# Patient Record
Sex: Male | Born: 1960 | Race: White | Hispanic: No | Marital: Married | Smoking: Current every day smoker
Health system: Southern US, Community
[De-identification: ages and names within clinical notes are randomized; demographics above are authoritative.]

## PROBLEM LIST (undated history)

## (undated) DIAGNOSIS — I509 Heart failure, unspecified: Secondary | ICD-10-CM

## (undated) DIAGNOSIS — I10 Essential (primary) hypertension: Secondary | ICD-10-CM

## (undated) DIAGNOSIS — Z87898 Personal history of other specified conditions: Secondary | ICD-10-CM

## (undated) DIAGNOSIS — F1991 Other psychoactive substance use, unspecified, in remission: Secondary | ICD-10-CM

## (undated) DIAGNOSIS — H8093 Unspecified otosclerosis, bilateral: Secondary | ICD-10-CM

## (undated) DIAGNOSIS — R740 Nonspecific elevation of levels of transaminase and lactic acid dehydrogenase [LDH]: Secondary | ICD-10-CM

## (undated) DIAGNOSIS — IMO0002 Reserved for concepts with insufficient information to code with codable children: Secondary | ICD-10-CM

## (undated) DIAGNOSIS — F1910 Other psychoactive substance abuse, uncomplicated: Secondary | ICD-10-CM

## (undated) DIAGNOSIS — J302 Other seasonal allergic rhinitis: Secondary | ICD-10-CM

## (undated) DIAGNOSIS — M069 Rheumatoid arthritis, unspecified: Secondary | ICD-10-CM

## (undated) DIAGNOSIS — B182 Chronic viral hepatitis C: Secondary | ICD-10-CM

## (undated) DIAGNOSIS — F1090 Alcohol use, unspecified, uncomplicated: Secondary | ICD-10-CM

## (undated) DIAGNOSIS — U071 COVID-19: Secondary | ICD-10-CM

## (undated) DIAGNOSIS — M79602 Pain in left arm: Secondary | ICD-10-CM

## (undated) DIAGNOSIS — K732 Chronic active hepatitis, not elsewhere classified: Secondary | ICD-10-CM

## (undated) DIAGNOSIS — M5030 Other cervical disc degeneration, unspecified cervical region: Secondary | ICD-10-CM

## (undated) DIAGNOSIS — H9190 Unspecified hearing loss, unspecified ear: Secondary | ICD-10-CM

## (undated) HISTORY — PX: DEBRIDEMENT LEG: SUR390

## (undated) HISTORY — PX: TIBIA FRACTURE SURGERY: SHX806

## (undated) HISTORY — DX: Rheumatoid arthritis, unspecified: M06.9

## (undated) HISTORY — DX: Heart failure, unspecified: I50.9

## (undated) HISTORY — DX: Essential (primary) hypertension: I10

## (undated) HISTORY — DX: Unspecified otosclerosis, bilateral: H80.93

## (undated) HISTORY — DX: Reserved for concepts with insufficient information to code with codable children: IMO0002

## (undated) HISTORY — DX: Other psychoactive substance abuse, uncomplicated: F19.10

## (undated) HISTORY — DX: Alcohol use, unspecified, uncomplicated: F10.90

## (undated) HISTORY — DX: Personal history of other specified conditions: Z87.898

## (undated) HISTORY — DX: Other cervical disc degeneration, unspecified cervical region: M50.30

## (undated) HISTORY — DX: Nonspecific elevation of levels of transaminase and lactic acid dehydrogenase (ldh): R74.0

## (undated) HISTORY — DX: Other psychoactive substance use, unspecified, in remission: F19.91

## (undated) HISTORY — DX: Chronic active hepatitis, not elsewhere classified: K73.2

## (undated) HISTORY — DX: Chronic viral hepatitis C: B18.2

---

## 1983-09-02 HISTORY — PX: SHOULDER SURGERY: SHX246

## 2003-09-02 HISTORY — PX: HIP SURGERY: SHX245

## 2012-07-27 DIAGNOSIS — M069 Rheumatoid arthritis, unspecified: Secondary | ICD-10-CM | POA: Insufficient documentation

## 2012-07-27 DIAGNOSIS — F1911 Other psychoactive substance abuse, in remission: Secondary | ICD-10-CM | POA: Insufficient documentation

## 2012-07-27 DIAGNOSIS — M5030 Other cervical disc degeneration, unspecified cervical region: Secondary | ICD-10-CM | POA: Insufficient documentation

## 2017-04-17 ENCOUNTER — Encounter: Payer: Self-pay | Admitting: Physician Assistant

## 2017-04-17 ENCOUNTER — Ambulatory Visit (INDEPENDENT_AMBULATORY_CARE_PROVIDER_SITE_OTHER): Payer: 59 | Admitting: Physician Assistant

## 2017-04-17 VITALS — BP 174/113 | HR 76 | Ht 70.0 in | Wt 147.0 lb

## 2017-04-17 DIAGNOSIS — Z1159 Encounter for screening for other viral diseases: Secondary | ICD-10-CM

## 2017-04-17 DIAGNOSIS — Z789 Other specified health status: Secondary | ICD-10-CM

## 2017-04-17 DIAGNOSIS — Z136 Encounter for screening for cardiovascular disorders: Secondary | ICD-10-CM | POA: Diagnosis not present

## 2017-04-17 DIAGNOSIS — M5030 Other cervical disc degeneration, unspecified cervical region: Secondary | ICD-10-CM | POA: Diagnosis not present

## 2017-04-17 DIAGNOSIS — K732 Chronic active hepatitis, not elsewhere classified: Secondary | ICD-10-CM

## 2017-04-17 DIAGNOSIS — Z131 Encounter for screening for diabetes mellitus: Secondary | ICD-10-CM | POA: Diagnosis not present

## 2017-04-17 DIAGNOSIS — Z13220 Encounter for screening for lipoid disorders: Secondary | ICD-10-CM | POA: Diagnosis not present

## 2017-04-17 DIAGNOSIS — Z9189 Other specified personal risk factors, not elsewhere classified: Secondary | ICD-10-CM | POA: Diagnosis not present

## 2017-04-17 DIAGNOSIS — Z7689 Persons encountering health services in other specified circumstances: Secondary | ICD-10-CM | POA: Diagnosis not present

## 2017-04-17 DIAGNOSIS — M069 Rheumatoid arthritis, unspecified: Secondary | ICD-10-CM

## 2017-04-17 DIAGNOSIS — F17200 Nicotine dependence, unspecified, uncomplicated: Secondary | ICD-10-CM | POA: Diagnosis not present

## 2017-04-17 DIAGNOSIS — I10 Essential (primary) hypertension: Secondary | ICD-10-CM | POA: Diagnosis not present

## 2017-04-17 DIAGNOSIS — B182 Chronic viral hepatitis C: Secondary | ICD-10-CM

## 2017-04-17 MED ORDER — ASPIRIN 81 MG PO TABS: 81.0000 mg | ORAL_TABLET | Freq: Every day | ORAL | 11 refills | Status: DC

## 2017-04-17 MED ORDER — CANDESARTAN CILEXETIL-HCTZ 16-12.5 MG PO TABS: 1.0000 | ORAL_TABLET | Freq: Every day | ORAL | 0 refills | Status: DC

## 2017-04-17 NOTE — Patient Instructions (Addendum)
For blood pressure: - start candesartan-hydrochlorothiazide every morning - baby aspirin 81 mg to help prevent heart attack/stroke - limit alcohol to 2 drinks per day - try to cut back on cigarettes - limit salt to <1500 mg/day - DASH eating plan - follow-up in 2 weeks  Return in 2 weeks for complete physical exam with fasting labs. You can have your labs done before your appointment. The lab is a walk-in open M-F 7:30a-4:30p (closed 12:30-1:30p). Nothing to eat or drink after midnight or at least 8 hours before your blood draw. You can have water and your medications.    DASH Eating Plan DASH stands for "Dietary Approaches to Stop Hypertension." The DASH eating plan is a healthy eating plan that has been shown to reduce high blood pressure (hypertension). It may also reduce your risk for type 2 diabetes, heart disease, and stroke. The DASH eating plan may also help with weight loss. What are tips for following this plan? General guidelines  Avoid eating more than 2,300 mg (milligrams) of salt (sodium) a day. If you have hypertension, you may need to reduce your sodium intake to 1,500 mg a day.  Limit alcohol intake to no more than 1 drink a day for nonpregnant women and 2 drinks a day for men. One drink equals 12 oz of beer, 5 oz of wine, or 1 oz of hard liquor.  Work with your health care provider to maintain a healthy body weight or to lose weight. Ask what an ideal weight is for you.  Get at least 30 minutes of exercise that causes your heart to beat faster (aerobic exercise) most days of the week. Activities may include walking, swimming, or biking.  Work with your health care provider or diet and nutrition specialist (dietitian) to adjust your eating plan to your individual calorie needs. Reading food labels  Check food labels for the amount of sodium per serving. Choose foods with less than 5 percent of the Daily Value of sodium. Generally, foods with less than 300 mg of sodium  per serving fit into this eating plan.  To find whole grains, look for the word "whole" as the first word in the ingredient list. Shopping  Buy products labeled as "low-sodium" or "no salt added."  Buy fresh foods. Avoid canned foods and premade or frozen meals. Cooking  Avoid adding salt when cooking. Use salt-free seasonings or herbs instead of table salt or sea salt. Check with your health care provider or pharmacist before using salt substitutes.  Do not fry foods. Cook foods using healthy methods such as baking, boiling, grilling, and broiling instead.  Cook with heart-healthy oils, such as olive, canola, soybean, or sunflower oil. Meal planning   Eat a balanced diet that includes: ? 5 or more servings of fruits and vegetables each day. At each meal, try to fill half of your plate with fruits and vegetables. ? Up to 6-8 servings of whole grains each day. ? Less than 6 oz of lean meat, poultry, or fish each day. A 3-oz serving of meat is about the same size as a deck of cards. One egg equals 1 oz. ? 2 servings of low-fat dairy each day. ? A serving of nuts, seeds, or beans 5 times each week. ? Heart-healthy fats. Healthy fats called Omega-3 fatty acids are found in foods such as flaxseeds and coldwater fish, like sardines, salmon, and mackerel.  Limit how much you eat of the following: ? Canned or prepackaged foods. ? Food that  is high in trans fat, such as fried foods. ? Food that is high in saturated fat, such as fatty meat. ? Sweets, desserts, sugary drinks, and other foods with added sugar. ? Full-fat dairy products.  Do not salt foods before eating.  Try to eat at least 2 vegetarian meals each week.  Eat more home-cooked food and less restaurant, buffet, and fast food.  When eating at a restaurant, ask that your food be prepared with less salt or no salt, if possible. What foods are recommended? The items listed may not be a complete list. Talk with your dietitian  about what dietary choices are best for you. Grains Whole-grain or whole-wheat bread. Whole-grain or whole-wheat pasta. Brown rice. Orpah Cobb. Bulgur. Whole-grain and low-sodium cereals. Pita bread. Low-fat, low-sodium crackers. Whole-wheat flour tortillas. Vegetables Fresh or frozen vegetables (raw, steamed, roasted, or grilled). Low-sodium or reduced-sodium tomato and vegetable juice. Low-sodium or reduced-sodium tomato sauce and tomato paste. Low-sodium or reduced-sodium canned vegetables. Fruits All fresh, dried, or frozen fruit. Canned fruit in natural juice (without added sugar). Meat and other protein foods Skinless chicken or [place]. Ground chicken or [place]. Pork with fat trimmed off. Fish and seafood. Egg whites. Dried beans, peas, or lentils. Unsalted nuts, nut butters, and seeds. Unsalted canned beans. Lean cuts of beef with fat trimmed off. Low-sodium, lean deli meat. Dairy Low-fat (1%) or fat-free (skim) milk. Fat-free, low-fat, or reduced-fat cheeses. Nonfat, low-sodium ricotta or cottage cheese. Low-fat or nonfat yogurt. Low-fat, low-sodium cheese. Fats and oils Soft margarine without trans fats. Vegetable oil. Low-fat, reduced-fat, or light mayonnaise and salad dressings (reduced-sodium). Canola, safflower, olive, soybean, and sunflower oils. Avocado. Seasoning and other foods Herbs. Spices. Seasoning mixes without salt. Unsalted popcorn and pretzels. Fat-free sweets. What foods are not recommended? The items listed may not be a complete list. Talk with your dietitian about what dietary choices are best for you. Grains Baked goods made with fat, such as croissants, muffins, or some breads. Dry pasta or rice meal packs. Vegetables Creamed or fried vegetables. Vegetables in a cheese sauce. Regular canned vegetables (not low-sodium or reduced-sodium). Regular canned tomato sauce and paste (not low-sodium or reduced-sodium). Regular tomato and vegetable juice (not low-sodium or  reduced-sodium). Rosita Fire. Olives. Fruits Canned fruit in a light or heavy syrup. Fried fruit. Fruit in cream or butter sauce. Meat and other protein foods Fatty cuts of meat. Ribs. Fried meat. Tomasa Blase. Sausage. Bologna and other processed lunch meats. Salami. Fatback. Hotdogs. Bratwurst. Salted nuts and seeds. Canned beans with added salt. Canned or smoked fish. Whole eggs or egg yolks. Chicken or [place] with skin. Dairy Whole or 2% milk, cream, and half-and-half. Whole or full-fat cream cheese. Whole-fat or sweetened yogurt. Full-fat cheese. Nondairy creamers. Whipped toppings. Processed cheese and cheese spreads. Fats and oils Butter. Stick margarine. Lard. Shortening. Ghee. Bacon fat. Tropical oils, such as coconut, palm kernel, or palm oil. Seasoning and other foods Salted popcorn and pretzels. Onion salt, garlic salt, seasoned salt, table salt, and sea salt. Worcestershire sauce. Tartar sauce. Barbecue sauce. Teriyaki sauce. Soy sauce, including reduced-sodium. Steak sauce. Canned and packaged gravies. Fish sauce. Oyster sauce. Cocktail sauce. Horseradish that you find on the shelf. Ketchup. Mustard. Meat flavorings and tenderizers. Bouillon cubes. Hot sauce and Tabasco sauce. Premade or packaged marinades. Premade or packaged taco seasonings. Relishes. Regular salad dressings. Where to find more information:  National Heart, Lung, and Blood Institute: PopSteam.is  American Heart Association: www.heart.org Summary  The DASH eating plan is a healthy  eating plan that has been shown to reduce high blood pressure (hypertension). It may also reduce your risk for type 2 diabetes, heart disease, and stroke.  With the DASH eating plan, you should limit salt (sodium) intake to 2,300 mg a day. If you have hypertension, you may need to reduce your sodium intake to 1,500 mg a day.  When on the DASH eating plan, aim to eat more fresh fruits and vegetables, whole grains, lean proteins, low-fat  dairy, and heart-healthy fats.  Work with your health care provider or diet and nutrition specialist (dietitian) to adjust your eating plan to your individual calorie needs. This information is not intended to replace advice given to you by your health care provider. Make sure you discuss any questions you have with your health care provider. Document Released: 08/07/2011 Document Revised: 08/11/2016 Document Reviewed: 08/11/2016 Elsevier Interactive Patient Education  2017 [employer].

## 2017-04-17 NOTE — Progress Notes (Signed)
HPI:                                                                Melvin Rich is a 56 y.o. male who presents to [facility]: Primary Care Sports Medicine today to establish care   HTN: patient reports diagnosed 30 years ago. Has been prescribed lisinopril-hctz, but self-discontinued due to lethargy. Has not been on antihypertensive therapy for over 5 years. Denies vision change, headache, chest pain with exertion, orthopnea, lightheadedness, syncope and edema. Risk factors include: male sex, tobacco use  RA: reports bilateral elbow "clicking" and elbows feel "on fire" daily. Denies constitutional symptoms or rash.   Health Maintenance Health Maintenance  Topic Date Due  . Hepatitis C Screening  08/08/1961  . HIV Screening  02/02/1976  . TETANUS/TDAP  02/02/1980  . COLONOSCOPY  02/02/2011  . INFLUENZA VACCINE  04/01/2017    Past Medical History:  Diagnosis Date  . Alcohol use disorder (HCC)   . Degenerative disc disease, cervical   . History of drug use disorder    Heroin, IVDA  . Hypertension   . Rheumatoid arthritis (HCC)   . Substance abuse    Past Surgical History:  Procedure Laterality Date  . HIP SURGERY  2005  . SHOULDER SURGERY  1985   Social History  Substance Use Topics  . Smoking status: Current Every Day Smoker    Packs/day: 0.50    Years: 45.00    Types: Cigarettes  . Smokeless tobacco: Never Used  . Alcohol use 1.8 - 2.4 oz/week    3 - 4 Cans of beer per week   He was adopted. Family history is unknown by patient.  ROS: Review of Systems  Constitutional: Positive for diaphoresis. Negative for chills, fever and weight loss.  HENT: Negative.   Eyes: Negative.   Respiratory: Negative.   Cardiovascular: Negative.   Musculoskeletal: Positive for joint pain and neck pain.  Skin: Negative for rash.  Neurological:       + numbness b/l hands  Endo/Heme/Allergies: Negative.   Psychiatric/Behavioral: Negative.       Medications: Current Outpatient Prescriptions  Medication Sig Dispense Refill  . aspirin 81 MG tablet Take 1 tablet (81 mg total) by mouth daily. 30 tablet 11  . candesartan-hydrochlorothiazide (ATACAND HCT) 16-12.5 MG tablet Take 1 tablet by mouth daily. 30 tablet 0   No current facility-administered medications for this visit.    No Known Allergies     Objective:  BP (!) 174/113   Pulse 76   Ht 5\' 10"  (1.778 m)   Wt 147 lb (66.7 kg)   BMI 21.09 kg/m  Gen:  alert, not ill-appearing, no distress, appropriate for age HEENT: head normocephalic without obvious abnormality, conjunctiva and cornea clear, trachea midline CV: Normal rate, regular rhythm, s1 and s2 distinct, no murmurs, clicks or rubs, no carotid bruit Pulm: Normal work of breathing, normal phonation, clear to auscultation Neuro: alert and oriented x 3, no tremor MSK: extremities atraumatic, normal gait and station Skin: intact, no rashes on exposed skin, no jaundice, no cyanosis Psych: well-groomed, cooperative, good eye contact, euthymic mood, affect mood-congruent, speech is articulate, and thought processes clear and goal-directed    No results found for this or any previous visit (from  the past 72 hour(s)). No results found.  Depression screen PHQ 2/9 04/17/2017  Decreased Interest 0  Down, Depressed, Hopeless 0  PHQ - 2 Score 0      Assessment and Plan: 56 y.o. male with   1. Encounter to establish care - reviewed PMH - reviewed health maintenance - due for colon cancer screening - due for prostate cancer screening - negative PHQ2  2. Uncontrolled stage 2 hypertension BP Readings from Last 3 Encounters:  04/17/17 (!) 174/113  - starting combination antihypertensive. Will avoid Lisinopril due to patient-reported lethargy - baby aspirin 81 mg for primary prevention - limit alcohol to 2 drinks per day - try to cut back on cigarettes - limit salt to <1500 mg/day - DASH eating plan -  follow-up in 2 weeks - candesartan-hydrochlorothiazide (ATACAND HCT) 16-12.5 MG tablet; Take 1 tablet by mouth daily.  Dispense: 30 tablet; Refill: 0 - aspirin 81 MG tablet; Take 1 tablet (81 mg total) by mouth daily.  Dispense: 30 tablet; Refill: 11 - COMPLETE METABOLIC PANEL WITH GFR - CBC  3. Tobacco use disorder - not ready to quit, declines smoking cessation  4. Drinks three beers per day - educated to limit to 2 standard drinks per day  5. DDD (degenerative disc disease), cervical - currently asymptomatic  6. Rheumatoid arthritis involving both elbows, unspecified rheumatoid factor presence (HCC) - Ambulatory referral to Rheumatology - CBC  7. Encounter for lipid screening for cardiovascular disease - Lipid Panel w/reflex Direct LDL  8. Screening for diabetes mellitus - COMPLETE METABOLIC PANEL WITH GFR  9. Encounter for hepatitis C virus screening test for high risk patient - Hepatitis C antibody  Patient education and anticipatory guidance given Patient agrees with treatment plan Follow-up in 2 weeks for CPE with fasting labs or sooner as needed  Levonne Hubert PA-C

## 2017-04-18 ENCOUNTER — Encounter: Payer: Self-pay | Admitting: Physician Assistant

## 2017-04-18 DIAGNOSIS — M5030 Other cervical disc degeneration, unspecified cervical region: Secondary | ICD-10-CM

## 2017-04-18 DIAGNOSIS — M069 Rheumatoid arthritis, unspecified: Secondary | ICD-10-CM

## 2017-04-18 DIAGNOSIS — I10 Essential (primary) hypertension: Secondary | ICD-10-CM | POA: Insufficient documentation

## 2017-04-18 DIAGNOSIS — F17200 Nicotine dependence, unspecified, uncomplicated: Secondary | ICD-10-CM | POA: Insufficient documentation

## 2017-04-18 DIAGNOSIS — Z789 Other specified health status: Secondary | ICD-10-CM | POA: Insufficient documentation

## 2017-04-18 HISTORY — DX: Other cervical disc degeneration, unspecified cervical region: M50.30

## 2017-04-18 HISTORY — DX: Essential (primary) hypertension: I10

## 2017-04-18 HISTORY — DX: Rheumatoid arthritis, unspecified: M06.9

## 2017-04-20 LAB — CBC
HCT: 44.6 % (ref 38.5–50.0)
Hemoglobin: 15.4 g/dL (ref 13.2–17.1)
MCH: 29.8 pg (ref 27.0–33.0)
MCHC: 34.5 g/dL (ref 32.0–36.0)
MCV: 86.4 fL (ref 80.0–100.0)
MPV: 10.6 fL (ref 7.5–12.5)
PLATELETS: 221 10*3/uL (ref 140–400)
RBC: 5.16 MIL/uL (ref 4.20–5.80)
RDW: 12.7 % (ref 11.0–15.0)
WBC: 4.2 10*3/uL (ref 3.8–10.8)

## 2017-04-21 LAB — LIPID PANEL W/REFLEX DIRECT LDL
Cholesterol: 190 mg/dL (ref <200)
HDL: 114 mg/dL (ref >40)
LDL-CHOLESTEROL: 61 mg/dL
Non-HDL Cholesterol (Calc): 76 mg/dL (ref <130)
TRIGLYCERIDES: 72 mg/dL (ref <150)
Total CHOL/HDL Ratio: 1.7 Ratio (ref <5.0)

## 2017-04-21 LAB — COMPLETE METABOLIC PANEL WITH GFR
ALBUMIN: 4.2 g/dL (ref 3.6–5.1)
ALT: 63 U/L — AB (ref 9–46)
AST: 48 U/L — ABNORMAL HIGH (ref 10–35)
Alkaline Phosphatase: 102 U/L (ref 40–115)
BILIRUBIN TOTAL: 0.9 mg/dL (ref 0.2–1.2)
BUN: 17 mg/dL (ref 7–25)
CO2: 21 mmol/L (ref 20–32)
Calcium: 9.3 mg/dL (ref 8.6–10.3)
Chloride: 107 mmol/L (ref 98–110)
Creat: 0.61 mg/dL — ABNORMAL LOW (ref 0.70–1.33)
GFR, Est Non African American: >89 mL/min (ref >=60)
Glucose, Bld: 85 mg/dL (ref 65–99)
Potassium: 4.6 mmol/L (ref 3.5–5.3)
Sodium: 140 mmol/L (ref 135–146)
TOTAL PROTEIN: 6.7 g/dL (ref 6.1–8.1)

## 2017-04-21 LAB — HEPATITIS C ANTIBODY: HCV AB: REACTIVE — AB

## 2017-04-23 ENCOUNTER — Encounter: Payer: Self-pay | Admitting: Physician Assistant

## 2017-04-23 DIAGNOSIS — B182 Chronic viral hepatitis C: Secondary | ICD-10-CM

## 2017-04-23 DIAGNOSIS — R7401 Elevation of levels of liver transaminase levels: Secondary | ICD-10-CM | POA: Insufficient documentation

## 2017-04-23 DIAGNOSIS — R740 Nonspecific elevation of levels of transaminase and lactic acid dehydrogenase [LDH]: Secondary | ICD-10-CM

## 2017-04-23 DIAGNOSIS — K732 Chronic active hepatitis, not elsewhere classified: Secondary | ICD-10-CM | POA: Insufficient documentation

## 2017-04-23 HISTORY — DX: Chronic viral hepatitis C: B18.2

## 2017-04-23 HISTORY — DX: Chronic active hepatitis, not elsewhere classified: K73.2

## 2017-04-23 HISTORY — DX: Elevation of levels of liver transaminase levels: R74.01

## 2017-04-23 LAB — HEPATITIS C RNA QUANTITATIVE
HCV Quantitative Log: 5.41 Log IU/mL — ABNORMAL HIGH
HCV Quantitative: 260000 IU/mL — ABNORMAL HIGH

## 2017-04-23 NOTE — Addendum Note (Signed)
Addended by: [name] E on: 04/23/2017 08:50 AM   Modules accepted: Orders

## 2017-04-23 NOTE — Progress Notes (Signed)
Melvin Rich, can you add-on a hepatitis c genotype, hepatitis B surface antigen AND antibody, and HIV antibody?  Okay to result the labs to the patient in the meantime:  Labs show hepatitis c infection that is currently active. This is a liver disease and requires treatment with medication. I am referring you to the hepatitis clinic within the infectious disease dept at [facility]. I am also ordering an ultrasound of your liver, which you will be contacted to schedule.

## 2017-04-24 ENCOUNTER — Encounter: Payer: Self-pay | Admitting: Physician Assistant

## 2017-04-24 DIAGNOSIS — Z789 Other specified health status: Secondary | ICD-10-CM

## 2017-04-24 DIAGNOSIS — Z0184 Encounter for antibody response examination: Secondary | ICD-10-CM | POA: Insufficient documentation

## 2017-04-24 LAB — HEPATITIS B SURFACE ANTIBODY, QUANTITATIVE

## 2017-04-24 LAB — HIV ANTIBODY (ROUTINE TESTING W REFLEX): HIV: NONREACTIVE

## 2017-04-24 LAB — HEPATITIS B SURFACE ANTIGEN: Hepatitis B Surface Ag: NONREACTIVE

## 2017-05-01 ENCOUNTER — Encounter: Payer: Self-pay | Admitting: Physician Assistant

## 2017-05-01 ENCOUNTER — Ambulatory Visit (INDEPENDENT_AMBULATORY_CARE_PROVIDER_SITE_OTHER): Payer: 59 | Admitting: Physician Assistant

## 2017-05-01 VITALS — BP 123/80 | HR 81 | Ht 70.0 in | Wt 151.4 lb

## 2017-05-01 DIAGNOSIS — K089 Disorder of teeth and supporting structures, unspecified: Secondary | ICD-10-CM | POA: Diagnosis not present

## 2017-05-01 DIAGNOSIS — I10 Essential (primary) hypertension: Secondary | ICD-10-CM | POA: Diagnosis not present

## 2017-05-01 DIAGNOSIS — Z0000 Encounter for general adult medical examination without abnormal findings: Secondary | ICD-10-CM

## 2017-05-01 DIAGNOSIS — B182 Chronic viral hepatitis C: Secondary | ICD-10-CM

## 2017-05-01 DIAGNOSIS — M069 Rheumatoid arthritis, unspecified: Secondary | ICD-10-CM | POA: Diagnosis not present

## 2017-05-01 DIAGNOSIS — Z1211 Encounter for screening for malignant neoplasm of colon: Secondary | ICD-10-CM | POA: Diagnosis not present

## 2017-05-01 MED ORDER — LOSARTAN POTASSIUM-HCTZ 100-25 MG PO TABS: 1.0000 | ORAL_TABLET | Freq: Every day | ORAL | 1 refills | Status: DC

## 2017-05-01 NOTE — Patient Instructions (Addendum)
-   Plan to have blood drawn today - Liver ultrasound - [facility] will contact you to schedule - Follow-up with Infectious Disease clinic    Hepatitis C Hepatitis C is a viral infection of the liver. It can lead to scarring of the liver (cirrhosis), liver failure, or liver cancer. Hepatitis C may go undetected for months or years because people with the infection may not have symptoms, or they may have only mild symptoms. What are the causes? Hepatitis C is caused by the hepatitis C virus (HCV). The virus can be passed from one person to another through:  Blood.  Contaminated needles, such as those used for tattooing, body piercing, acupuncture, or injecting drugs.  Having unprotected sex with an infected person.  Childbirth.  Blood transfusions or organ transplants done in the [place] before 1992.  What increases the risk? Risk factors for hepatitis C include:  Having unprotected sex with an infected person.  Using illegal drugs.  What are the signs or symptoms? Symptoms of hepatitis C may include:  Fatigue.  Loss of appetite.  Nausea.  Vomiting.  Abdominal pain.  Dark yellow urine.  Yellowish skin and eyes (jaundice).  Itching of the skin.  Clay-colored bowel movements.  Joint pain.  Symptoms are not always present. How is this diagnosed? Hepatitis C is diagnosed with blood tests. Other types of tests may also be done to check how your liver is functioning. How is this treated? Your health care provider may perform noninvasive tests or a liver biopsy to help determine the best course of treatment. Treatment for hepatitis C may include one or more medicines. Your health care provider may check you for a recurring infection or other liver conditions every 6-12 months after treatment. Follow these instructions at home:  Rest as needed.  Take all medicines as directed by your health care provider.  Do not take any medicine unless approved by  your health care provider. This includes over-the-counter medicine and birth control pills.  Do not drink alcohol.  Do not have sex until approved by your health care provider.  Do not share toothbrushes, nail clippers, razors, or needles. How is this prevented? There is no vaccine for hepatitis C. The only way to prevent the disease is to reduce the risk of exposure to the virus. This may be done by:  Practicing safe sex and using condoms.  Avoiding illegal drugs.  Contact a health care provider if:  You have a fever.  You develop abdominal pain.  You develop dark urine.  You have clay-colored bowel movements.  You develop joint pains. Get help right away if:  You have increasing fatigue or weakness.  You lose your appetite.  You feel nauseous or vomit.  You develop jaundice or your jaundice gets worse.  You bruise or bleed easily. This information is not intended to replace advice given to you by your health care provider. Make sure you discuss any questions you have with your health care provider. Document Released: 08/15/2000 Document Revised: 01/24/2016 Document Reviewed: 11/30/2013 Elsevier Interactive Patient Education  2017 [employer].

## 2017-05-01 NOTE — Progress Notes (Signed)
HPI:                                                                Melvin Rich is a 56 y.o. male who presents to [place] [facility]: Primary Care Sports Medicine today for annual physical exam  Current Concerns include none  Health Maintenance Health Maintenance  Topic Date Due  . TETANUS/TDAP  02/02/1980  . COLONOSCOPY  02/02/2011  . INFLUENZA VACCINE  04/01/2018 (Originally 04/01/2017)  . Hepatitis C Screening  Completed  . HIV Screening  Completed    Past Medical History:  Diagnosis Date  . Alcohol use disorder ([place])   . Chronic active hepatitis ([place]) 04/23/2017  . Chronic hepatitis C virus infection ([place]) 04/23/2017  . Degenerative disc disease, cervical   . History of drug use disorder    Heroin, IVDA  . Hypertension   . Rheumatoid arthritis ([place])   . Substance abuse   . Transaminitis 04/23/2017   Past Surgical History:  Procedure Laterality Date  . HIP SURGERY  2005  . [place]   Social History  Substance Use Topics  . Smoking status: Current Every Day Smoker    Packs/day: 0.50    Years: 45.00    Types: Cigarettes  . Smokeless tobacco: Never Used  . Alcohol use 1.8 - 2.4 oz/week    3 - 4 Cans of beer per week   He was adopted. Family history is unknown by patient.  ROS: Review of Systems  Constitutional: Negative.   HENT: Negative.   Eyes: Negative.   Respiratory: Negative.   Cardiovascular: Negative.   Gastrointestinal: Negative.   Genitourinary: Negative.   Musculoskeletal: Positive for joint pain.  Skin: Negative.   Neurological: Negative.   Endo/Heme/Allergies: Negative.   Psychiatric/Behavioral: Negative.      Medications: Current Outpatient Prescriptions  Medication Sig Dispense Refill  . aspirin 81 MG tablet Take 1 tablet (81 mg total) by mouth daily. 30 tablet 11  . candesartan-hydrochlorothiazide (ATACAND HCT) 16-12.5 MG tablet Take 1 tablet by mouth daily. 30 tablet 0  . meloxicam (MOBIC) 15 MG  tablet Take 15 mg by mouth daily.    Marland Kitchen losartan-hydrochlorothiazide (HYZAAR) 100-25 MG tablet Take 1 tablet by mouth daily. 90 tablet 1   No current facility-administered medications for this visit.    No Known Allergies   Objective:  BP 123/80   Pulse 81   Ht 5' 10"  (1.778 m)   Wt 151 lb 6.4 oz (68.7 kg)   SpO2 99%   BMI 21.72 kg/m  General Appearance:  Alert, cooperative, no distress, appropriate for age                            Head:  Normocephalic, without obvious abnormality                             Eyes:  PERRL, EOM's intact, conjunctiva and cornea clear                             Ears:  TM pearly gray color and semitransparent, external ear canals normal, both ears  Nose:  Nares symmetrical                          Throat:  Lips, tongue, and mucosa are moist, pink, and intact; poor dentition                             Neck:  Supple; symmetrical, trachea midline, no adenopathy; thyroid: no enlargement, symmetric, no tenderness/mass/nodules                             Back:  Symmetrical, no curvature, ROM normal                                        Lungs:  Clear to auscultation bilaterally, respirations unlabored                             Heart:  regular rate & normal rhythm, S1 and S2 normal, no murmurs, rubs, or gallops                     Abdomen:  Soft, non-tender, nondistended, no ascites, no mass or organomegaly              Genitourinary:  deferred         Musculoskeletal:  Tone and strength strong and symmetrical, all extremities; no joint pain or edema, normal gait and station                                    Lymphatic:  No adenopathy             Skin/Hair/Nails:  Skin warm, dry and intact, no rashes or abnormal dyspigmentation on limited exam                   Neurologic:  Alert and oriented x3, no cranial nerve deficits, DTR's intact, sensation grossly intact, normal gait and station, no tremor Psych: well-groomed, cooperative,  good eye contact, euthymic mood, affect mood-congruent, speech is articulate, and thought processes clear and goal-directed  Depression screen [facility] 2/9 05/01/2017 04/17/2017  Decreased Interest 0 0  Down, Depressed, Hopeless 0 0  PHQ - 2 Score 0 0     No results found for this or any previous visit (from the past 72 hour(s)). No results found.    Assessment and Plan: 56 y.o. male with   1. Encounter for annual physical exam - reviewed health maintenance. Due for colon cancer screening - reviewed fasting labs. Lipid panel and CBC wnl. CMP showing transaminitis due to active chronic HCV - negative PHQ2  2. Colon cancer screening - Cologuard ordered - patient informed to contact insurance company prior to opening kit to discuss coverage and co-pay  3. Uncontrolled stage 2 hypertension BP Readings from Last 3 Encounters:  05/01/17 123/80  04/17/17 (!) 174/113  - BP at goal. Patient requesting medication change because Candesartan is not on his formulary. Switching to Hyzaar - losartan-hydrochlorothiazide (HYZAAR) 100-25 MG tablet; Take 1 tablet by mouth daily.  Dispense: 90 tablet; Refill: 1  4. Chronic hepatitis C without hepatic coma (HCC) - positive antibody screening with RNA quant 260,000 -  discussed diagnosis with patient and answered questions. He will be referred to ID for further management. Ordering genotyping and liver elastography - HIV antibody negative. Does not show immunity to Hep B.  - Ambulatory referral to Infectious Disease - Hepatitis C Genotype - US LIVER ELASTOGRAPHY W/O IMG; Future  5. Rheumatoid arthritis involving both elbows, unspecified rheumatoid factor presence (HCC) - Rheumatoid Factor per patient request - Ambulatory referral to Rheumatology - patient does not like the provider at [facility]  Patient education and anticipatory guidance given Patient agrees with treatment plan Follow-up in 6 months for hypertension or sooner as  needed  Darlyne Russian PA-C

## 2017-05-05 DIAGNOSIS — K089 Disorder of teeth and supporting structures, unspecified: Secondary | ICD-10-CM | POA: Insufficient documentation

## 2017-05-05 HISTORY — DX: Disorder of teeth and supporting structures, unspecified: K08.9

## 2017-05-05 LAB — RHEUMATOID FACTOR: Rheumatoid fact SerPl-aCnc: 15 IU/mL — ABNORMAL HIGH (ref <14)

## 2017-05-06 LAB — HEPATITIS C GENOTYPE

## 2017-05-07 ENCOUNTER — Encounter: Payer: Self-pay | Admitting: Internal Medicine

## 2017-05-07 ENCOUNTER — Ambulatory Visit (INDEPENDENT_AMBULATORY_CARE_PROVIDER_SITE_OTHER): Payer: 59 | Admitting: Internal Medicine

## 2017-05-07 VITALS — BP 143/97 | HR 71 | Ht 70.0 in | Wt 151.0 lb

## 2017-05-07 DIAGNOSIS — Z23 Encounter for immunization: Secondary | ICD-10-CM | POA: Diagnosis not present

## 2017-05-07 DIAGNOSIS — B182 Chronic viral hepatitis C: Secondary | ICD-10-CM

## 2017-05-07 MED ORDER — LEDIPASVIR-SOFOSBUVIR 90-400 MG PO TABS: 1.0000 | ORAL_TABLET | Freq: Every day | ORAL | 2 refills | Status: DC

## 2017-05-07 NOTE — Patient Instructions (Signed)
Date 05/07/17  Dear Melvin Rich, As discussed in the ID Clinic, your hepatitis C therapy will include the following medications:          Harvoni 90mg /400mg  tablet:           Take 1 tablet by mouth once daily   Please note that ALL MEDICATIONS WILL START ON THE SAME DATE for a total of 12 weeks. ---------------------------------------------------------------- Your HCV Treatment Start Date: TBA   Your HCV genotype:  1a    Liver Fibrosis: TBD    ---------------------------------------------------------------- YOUR PHARMACY CONTACT:   [facility] [address] [place], [place] Phone: [phone] Hours: Monday to Friday 7:30 am to 6:00 pm   Please always contact your pharmacy at least 3-4 business days before you run out of medications to ensure your next month's medication is ready or 1 week prior to running out if you receive it by mail.  Remember, each prescription is for 28 days. ---------------------------------------------------------------- GENERAL NOTES REGARDING YOUR HEPATITIS C MEDICATION:  SOFOSBUVIR/LEDIPASVIR (HARVONI): - Harvoni tablet is taken daily with OR without food. - The tablets are orange. - The tablets should be stored at room temperature.  - Acid reducing agents such as H2 blockers (ie. Pepcid (famotidine), Zantac (ranitidine), Tagamet (cimetidine), Axid (nizatidine) and proton pump inhibitors (ie. Prilosec (omeprazole), Protonix (pantoprazole), Nexium (esomeprazole), or Aciphex (rabeprazole)) can decrease effectiveness of Harvoni. Do not take until you have discussed with a health care provider.    -Antacids that contain magnesium and/or aluminum hydroxide (ie. Milk of Magensia, Rolaids, Gaviscon, Maalox, Mylanta, an dArthritis Pain Formula)can reduce absorption of Harvoni, so take them at least 4 hours before or after Harvoni.  -Calcium carbonate (calcium supplements or antacids such as Tums, Caltrate, Os-Cal)needs to be  taken at least 4 hours hours before or after Harvoni.  -St. John's wort or any products that contain St. John's wort like some herbal supplements  Please inform the office prior to starting any of these medications.  - The common side effects associated with Harvoni include:      1. Fatigue      2. Headache      3. Nausea      4. Diarrhea      5. Insomnia  Please note that this only lists the most common side effects and is NOT a comprehensive list of the potential side effects of these medications. For more information, please review the drug information sheets that come with your medication package from the pharmacy.  ---------------------------------------------------------------- GENERAL HELPFUL HINTS ON HCV THERAPY: 1. Stay well-hydrated. 2. Notify the ID Clinic of any changes in your other over-the-counter/herbal or prescription medications. 3. If you miss a dose of your medication, take the missed dose as soon as you remember. Return to your regular time/dose schedule the next day.  4.  Do not stop taking your medications without first talking with your healthcare provider. 5.  You may take Tylenol (acetaminophen), as long as the dose is less than 2000 mg (OR no more than 4 tablets of the Tylenol Extra Strengths 500mg  tablet) in 24 hours. 6.  You will see our pharmacist-specialist within the first 2 weeks of starting your medication to monitor for any possible side effects. 7.  You will have labs once during treatment, after soon after treatment completion and one final lab 6 months after treatment completion to verify the virus is out of your system.  Wednesday, MD  [facility] [place], [place]  [id number] [phone]

## 2017-05-07 NOTE — Progress Notes (Signed)
[facility]   CC: consideration for treatment for chronic hepatitis C  HPI:  +Melvin Rich is a 56 y.o. male who presents for initial evaluation and management of chronic hepatitis C.  Patient tested positive last month during routine screening. Hepatitis C-associated risk factors present are: IV drug abuse (details: last used in 2005). Patient denies renal dialysis. Patient has had other studies performed. Results: hepatitis C RNA by PCR, result: positive. Patient has not had prior treatment for Hepatitis C. Patient does not have a past history of liver disease. Patient does not have a family history of liver disease. Patient does not  have associated signs or symptoms related to liver disease.  Labs reviewed and confirm chronic hepatitis C with a positive viral load.   Records reviewed from Epic and labs.  Scheduled tomorrow for elastography.   Newly established with his PCP     Patient does not have documented immunity to Hepatitis A. Patient does not have documented immunity to Hepatitis B.    Review of Systems:   Constitutional: negative for fatigue and malaise Gastrointestinal: negative for diarrhea Integument/breast: negative for rash and multiple tattoos All other systems reviewed and are negative       Past Medical History:  Diagnosis Date  . Alcohol use disorder (HCC)   . Chronic active hepatitis (HCC) 04/23/2017  . Chronic hepatitis C virus infection (HCC) 04/23/2017  . Degenerative disc disease, cervical   . History of drug use disorder    Heroin, IVDA  . Hypertension   . Rheumatoid arthritis (HCC)   . Substance abuse   . Transaminitis 04/23/2017    Prior to Admission medications   Medication Sig Start Date End Date Taking? Authorizing Provider  aspirin 81 MG tablet Take 1 tablet (81 mg total) by mouth daily. 04/17/17  Yes Carlis Stable, PA-C  candesartan-hydrochlorothiazide (ATACAND HCT) 16-12.5 MG tablet Take 1  tablet by mouth daily.   Yes [provider]  meloxicam (MOBIC) 15 MG tablet Take 15 mg by mouth daily.   Yes [provider]  losartan-hydrochlorothiazide (HYZAAR) 100-25 MG tablet Take 1 tablet by mouth daily. Patient not taking: Reported on 05/07/2017 05/01/17   Carlis Stable, PA-C    No Known Allergies  Social History  Substance Use Topics  . Smoking status: Current Every Day Smoker    Packs/day: 0.50    Years: 45.00    Types: Cigarettes  . Smokeless tobacco: Never Used  . Alcohol use 1.8 - 2.4 oz/week    3 - 4 Cans of beer per week  endorses daily drinking  Family History  Problem Relation Age of Onset  . Adopted: Yes  . Family history unknown: Yes     Objective:  Constitutional: in no apparent distress,  Vitals:   05/07/17 0856  BP: (!) 143/97  Pulse: 71   Eyes: anicteric Cardiovascular: Cor RRR Respiratory: CTA B; normal respiratory effort Gastrointestinal: Bowel sounds are normal, liver is not enlarged, spleen is not enlarged Musculoskeletal: no pedal edema noted Skin: negatives: no rash; no porphyria cutanea tarda Lymphatic: no cervical lymphadenopathy   Laboratory Genotype:  Lab Results  Component Value Date   HCVGENOTYPE 1a 05/01/2017   HCV viral load:  Lab Results  Component Value Date   HCVQUANT 260,000 (H) 04/20/2017   Lab Results  Component Value Date   WBC 4.2 04/20/2017   HGB 15.4 04/20/2017   HCT 44.6 04/20/2017   MCV 86.4 04/20/2017   PLT  221 04/20/2017    Lab Results  Component Value Date   CREATININE 0.61 (L) 04/20/2017   BUN 17 04/20/2017   NA 140 04/20/2017   K 4.6 04/20/2017   CL 107 04/20/2017   CO2 21 04/20/2017    Lab Results  Component Value Date   ALT 63 (H) 04/20/2017   AST 48 (H) 04/20/2017   ALKPHOS 102 04/20/2017     Labs and history reviewed and show CHILD-PUGH A  5-6 points: Child class A 7-9 points: Child class B 10-15 points: Child class C  Lab Results  Component Value  Date   BILITOT 0.9 04/20/2017   ALBUMIN 4.2 04/20/2017     Assessment: New Patient with Chronic Hepatitis C genotype 1a, untreated.  I discussed with the patient the lab findings that confirm chronic hepatitis C as well as the natural history and progression of disease including about 30% of people who develop cirrhosis of the liver if left untreated and once cirrhosis is established there is a 2-7% risk per year of liver cancer and liver failure.  I discussed the importance of treatment and benefits in reducing the risk, even if significant liver fibrosis exists.   Plan: 1) Patient counseled extensively on limiting acetaminophen to no more than 2 grams daily, avoidance of alcohol. 2) Transmission discussed with patient including sexual transmission, sharing razors and toothbrush.   3) Will need referral to gastroenterology if concern for cirrhosis 4) Will need referral for substance abuse counseling: No.; Further work up to include urine drug screen  No. 5) Will prescribe Harvoni for 12 weeks 6) Hepatitis A vaccine Yes.   7) Hepatitis B vaccine Yes.   8) Pneumovax vaccine 9) Further work up to include liver staging with elastography 10) will follow up after starting medication  He is a significant drinker and I suspect will have F3/4 elastography though will check tomorrow.  He will need   I will defer Fibrosure since he is getting elastography tomorrow, will defer getting hepatitis A ab and just give the vaccine, defer INR since his platelets and albumin are wnl.

## 2017-05-07 NOTE — Progress Notes (Signed)
Rheumatoid factor is positive Hepatitis c type is 1a - there are multiple potential treatment regimens and the specialist will review them with you Please be sure to schedule your liver ultrasound. You should be getting a call

## 2017-05-08 ENCOUNTER — Ambulatory Visit (HOSPITAL_COMMUNITY)
Admission: RE | Admit: 2017-05-08 | Discharge: 2017-05-08 | Disposition: A | Discharge status: Home / Self Care | Payer: 59 | Source: Ambulatory Visit | Attending: Physician Assistant | Admitting: Physician Assistant

## 2017-05-08 DIAGNOSIS — B182 Chronic viral hepatitis C: Secondary | ICD-10-CM | POA: Diagnosis not present

## 2017-05-08 DIAGNOSIS — N281 Cyst of kidney, acquired: Secondary | ICD-10-CM | POA: Diagnosis not present

## 2017-05-11 ENCOUNTER — Encounter: Payer: Self-pay | Admitting: Internal Medicine

## 2017-05-11 ENCOUNTER — Other Ambulatory Visit: Payer: Self-pay | Admitting: Pharmacist

## 2017-05-12 ENCOUNTER — Other Ambulatory Visit: Payer: Self-pay | Admitting: Pharmacist Clinician (PhC)/ Clinical Pharmacy Specialist

## 2017-05-12 MED ORDER — LEDIPASVIR-SOFOSBUVIR 90-400 MG PO TABS: 1.0000 | ORAL_TABLET | Freq: Every day | ORAL | 2 refills | Status: DC

## 2017-05-12 NOTE — Progress Notes (Signed)
Had to be tx to [facility]

## 2017-05-21 ENCOUNTER — Telehealth: Payer: Self-pay | Admitting: Licensed Clinical Social Worker

## 2017-05-21 NOTE — Telephone Encounter (Signed)
Patient's wife called to advise that her insurance notified her that her husband was approved for Harvoni. I left a message for Derwood Kaplan and I will route this note to her as well.

## 2017-05-21 NOTE — Telephone Encounter (Signed)
I left a voicemail with [name], then called and spoke with Melvin Rich.  His approved medication, Harvoni is with [employer].  He states he will call them after work today.  [name] number is [phone]

## 2017-05-24 ENCOUNTER — Encounter: Payer: Self-pay | Admitting: Internal Medicine

## 2017-05-26 ENCOUNTER — Encounter: Payer: Self-pay | Admitting: Pharmacy Technician

## 2017-06-14 ENCOUNTER — Emergency Department
Admission: EM | Admit: 2017-06-14 | Discharge: 2017-06-14 | Disposition: A | Discharge status: Home / Self Care | Payer: 59 | Source: Home / Self Care

## 2017-06-14 ENCOUNTER — Encounter: Payer: Self-pay | Admitting: Emergency Medicine

## 2017-06-14 DIAGNOSIS — M436 Torticollis: Secondary | ICD-10-CM

## 2017-06-14 DIAGNOSIS — S161XXA Strain of muscle, fascia and tendon at neck level, initial encounter: Secondary | ICD-10-CM

## 2017-06-14 MED ORDER — ONDANSETRON 4 MG PO TBDP
4.0000 mg | ORAL_TABLET | Freq: Once | ORAL | Status: AC
  Administered 2017-06-14: 4 mg via ORAL

## 2017-06-14 MED ORDER — KETOROLAC TROMETHAMINE 60 MG/2ML IM SOLN
60.0000 mg | Freq: Once | INTRAMUSCULAR | Status: AC
  Administered 2017-06-14: 60 mg via INTRAMUSCULAR

## 2017-06-14 MED ORDER — OXYCODONE-ACETAMINOPHEN 5-325 MG PO TABS: 1.0000 | ORAL_TABLET | Freq: Four times a day (QID) | ORAL | 0 refills | Status: DC | PRN

## 2017-06-14 MED ORDER — DIAZEPAM 5 MG PO TABS: 5.0000 mg | ORAL_TABLET | Freq: Two times a day (BID) | ORAL | 0 refills | Status: DC

## 2017-06-14 NOTE — ED Triage Notes (Signed)
Patient has had posterior neck pain for past 2 days; no injury; chronic degenerative disc disease; has had neck flares in past, but not recently; did not take pain med. Reports having stopped alcohol intake 21 days ago. Is also nauseated.

## 2017-06-14 NOTE — ED Provider Notes (Signed)
[facility]    CSN: [id number] Arrival date & time: 06/14/17  1125     History   Chief Complaint Chief Complaint  Patient presents with  . Neck Pain    HPI Melvin Rich is a 56 y.o. male.  Here with wife who is very supportive.  HPI Patient has had Right posterior neck pain for past 2 days; no injury; He feels he turned his head or "tweaked" his neck 2 days ago which caused severe sharp and dull right posterior neck pain without radiation. He states he's had this years ago and needed Percocet and a muscle relaxant. He tried ibuprofen without help. He denies midline posterior C-spine pain. He states he has chronic degenerative disc disease; has had neck flares in past, but not recently.  Reports that he has a history of alcohol use disorder. He reports he having stopped alcohol intake 21 days ago, at the same time as Starting on treatment for hepatitis C 21 days ago. Reviewed most recent liver functions in EMR, AST mildly elevated 48, ALT mildly elevated 63, but other LFTs within normal limits.  Is also mildly nauseated, so we ordered Zofran 4 mg SL stat, and his nausea improved Past Medical History:  Diagnosis Date  . Alcohol use disorder   . Chronic active hepatitis (HCC) 04/23/2017  . Chronic hepatitis C virus infection (HCC) 04/23/2017  . Degenerative disc disease, cervical   . Degenerative disc disease, cervical   . History of drug use disorder    Heroin, IVDA  . Hypertension   . Rheumatoid arthritis (HCC)   . Substance abuse (HCC)   . Transaminitis 04/23/2017    Patient Active Problem List   Diagnosis Date Noted  . Poor dentition 05/05/2017  . 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 Chronic hepatitis C virus infection (HCC) 04/23/2017  . Transaminitis 04/23/2017  . Uncontrolled stage 2 hypertension 04/18/2017  . Tobacco use disorder 04/18/2017  . Drinks three beers per day 04/18/2017  . DDD  (degenerative disc disease), cervical 04/18/2017  . Rheumatoid arthritis involving both elbows (HCC) 04/18/2017  . H/O drug abuse 07/27/2012    Past Surgical History:  Procedure Laterality Date  . HIP SURGERY  2005  . SHOULDER SURGERY  1985       Home Medications    Prior to Admission medications   Medication Sig Start Date End Date Taking? Authorizing Provider  aspirin 81 MG tablet Take 1 tablet (81 mg total) by mouth daily. 04/17/17   Carlis Stable, PA-C  candesartan-hydrochlorothiazide (ATACAND HCT) 16-12.5 MG tablet Take 1 tablet by mouth daily.    [provider]  diazepam (VALIUM) 5 MG tablet Take 1 tablet (5 mg total) by mouth 2 (two) times daily. For muscle spasms in neck. Caution: May cause drowsiness 06/14/17   Lajean Manes, MD  Ledipasvir-Sofosbuvir (HARVONI) 90-400 MG TABS Take 1 tablet by mouth daily. 05/12/17   Comer, Belia Heman, MD  losartan-hydrochlorothiazide (HYZAAR) 100-25 MG tablet Take 1 tablet by mouth daily. Patient not taking: Reported on 05/07/2017 05/01/17   Carlis Stable, PA-C  meloxicam (MOBIC) 15 MG tablet Take 15 mg by mouth daily.    [provider]  oxyCODONE-acetaminophen (PERCOCET/ROXICET) 5-325 MG tablet Take 1-2 tablets by mouth every 6 (six) hours as needed for severe pain. 06/14/17   Lajean Manes, MD    Family History Family History  Problem Relation Age of Onset  . Adopted: Yes  .  Family history unknown: Yes    Social History Social History  Substance Use Topics  . Smoking status: Current Every Day Smoker    Packs/day: 0.50    Years: 45.00    Types: Cigarettes  . Smokeless tobacco: Never Used  . Alcohol use 1.8 - 2.4 oz/week    3 - 4 Cans of beer per week     Comment: stopped 3 weeks ago     Allergies   Patient has no known allergies.   Review of Systems Review of Systems  All other systems reviewed and are negative.  he denies any new neurologic symptoms or any new weakness or  numbness. No incontinence. He admits to mild resting tremor. No seizures.  Physical Exam Triage Vital Signs ED Triage Vitals  Enc Vitals Group     BP 06/14/17 1155 (!) 157/110     Pulse Rate 06/14/17 1155 81     Resp 06/14/17 1155 18     Temp 06/14/17 1155 98.6 F (37 C)     Temp Source 06/14/17 1155 Oral     SpO2 06/14/17 1155 99 %     Weight 06/14/17 1155 145 lb (65.8 kg)     Height 06/14/17 1155 5\' 9"  (1.753 m)     Head Circumference --      Peak Flow --      Pain Score 06/14/17 1156 8     Pain Loc --      Pain Edu? --      Excl. in GC? --    No data found.   Updated Vital Signs BP (!) 157/110 (BP Location: Left Arm)   Pulse 81   Temp 98.6 F (37 C) (Oral)   Resp 18   Ht 5\' 9"  (1.753 m)   Wt 145 lb (65.8 kg)   SpO2 99%   BMI 21.41 kg/m   Visual Acuity Right Eye Distance:   Left Eye Distance:   Bilateral Distance:    Right Eye Near:   Left Eye Near:    Bilateral Near:     Physical Exam  Constitutional: He is oriented to person, place, and time. He appears well-developed and well-nourished. No distress.  Alert, cooperative, in moderate discomfort from right posterior neck pain. He moves slowly.  HENT:  Head: Normocephalic and atraumatic.  Eyes: Pupils are equal, round, and reactive to light. No scleral icterus.  Neck: Normal range of motion. Neck supple.  Cardiovascular: Normal rate and regular rhythm.   Pulmonary/Chest: Effort normal.  Abdominal: He exhibits no distension.  Musculoskeletal:       Cervical back: He exhibits decreased range of motion, tenderness (With severe spasm right posterior cervical muscles.) and spasm. He exhibits no bony tenderness, no swelling, no edema, no deformity and no laceration.  Neurological: He is alert and oriented to person, place, and time. No cranial nerve deficit or sensory deficit. He exhibits normal muscle tone.  Skin: Skin is warm and dry. No rash noted.  Psychiatric: He has a normal mood and affect. His behavior  is normal.  Vitals reviewed.    UC Treatments / Results  Labs (all labs ordered are listed, but only abnormal results are displayed) Labs Reviewed - No data to display  EKG  EKG Interpretation None       Radiology No results found. I advised and offered x-rays C-spine today, but he declined. Procedures Procedures (including critical care time)  Medications Ordered in UC Medications  ondansetron (ZOFRAN-ODT) disintegrating tablet 4 mg (4 mg  Oral Given 06/14/17 1200)  ketorolac (TORADOL) injection 60 mg (60 mg Intramuscular Given 06/14/17 1227)     Initial Impression / Assessment and Plan / UC Course  I have reviewed the triage vital signs and the nursing notes.  Pertinent labs & imaging results that were available during my care of the patient were reviewed by me and considered in my medical decision making (see chart for details).       Final Clinical Impressions(s) / UC Diagnoses   Final diagnoses:  Torticollis, acute  Acute strain of neck muscle, initial encounter  He has no tenderness directly over C-spine. Treatment options discussed, as well as risks, benefits, alternatives. Patient and wife voiced understanding and agreement with the following plans: Medications  ondansetron (ZOFRAN-ODT) disintegrating tablet 4 mg (4 mg Oral Given 06/14/17 1200)--This helped with nausea   ketorolac (TORADOL) injection 60 mg (60 mg Intramuscular Given 06/14/17 1227)-He was observed, and after 20 minutes, this mildly improved his posterior neck pain.     New Prescriptions Discharge Medication List as of 06/14/2017  2:40 PM    START taking these medications   Details  diazepam (VALIUM) 5 MG tablet Take 1 tablet (5 mg total) by mouth 2 (two) times daily. For muscle spasms in neck. Caution: May cause drowsiness, Starting Sun 06/14/2017, Print    oxyCODONE-acetaminophen (PERCOCET/ROXICET) 5-325 MG tablet Take 1-2 tablets by mouth every 6 (six) hours as needed for severe  pain., Starting Sun 06/14/2017, Print       BP rechecked 149/99. I urged him to follow-up with PCP within one to 2 weeks to have BP rechecked and reevaluated.  Controlled Substance Prescriptions [place] Controlled Substance Registry consulted? Yes, I have consulted the [place] Controlled Substances Registry for this patient, and feel the risk/benefit ratio today is favorable for proceeding with this prescription for a controlled substance.   Lajean Manes, MD 06/14/17 2132

## 2017-06-22 ENCOUNTER — Ambulatory Visit (INDEPENDENT_AMBULATORY_CARE_PROVIDER_SITE_OTHER): Payer: 59 | Admitting: Family Medicine

## 2017-06-22 ENCOUNTER — Encounter: Payer: Self-pay | Admitting: Physician Assistant

## 2017-06-22 ENCOUNTER — Ambulatory Visit (INDEPENDENT_AMBULATORY_CARE_PROVIDER_SITE_OTHER): Payer: 59

## 2017-06-22 ENCOUNTER — Encounter: Payer: Self-pay | Admitting: Internal Medicine

## 2017-06-22 VITALS — BP 127/85 | HR 80 | Wt 154.0 lb

## 2017-06-22 DIAGNOSIS — M791 Myalgia, unspecified site: Secondary | ICD-10-CM

## 2017-06-22 DIAGNOSIS — M19011 Primary osteoarthritis, right shoulder: Secondary | ICD-10-CM | POA: Diagnosis not present

## 2017-06-22 MED ORDER — NAPROXEN 500 MG PO TABS: 500.0000 mg | ORAL_TABLET | Freq: Two times a day (BID) | ORAL | 0 refills | Status: DC

## 2017-06-22 MED ORDER — DICLOFENAC SODIUM 1 % TD GEL: 4.0000 g | Freq: Four times a day (QID) | TRANSDERMAL | 11 refills | Status: DC

## 2017-06-22 NOTE — Patient Instructions (Addendum)
Thank you for coming in today. Try naproxen for pain.  Also use topical diclofenac gel.  Use over the counter lidocaine patches as well as needed.  Get xray today.   Recheck with me in 1 month or sooner if needed.   Next step would be a trial of injection.    Muscle Pain, Adult Muscle pain (myalgia) may be mild or severe. In most cases, the pain lasts only a short time and it goes away without treatment. It is normal to feel some muscle pain after starting a workout program. Muscles that have not been used often will be sore at first. Muscle pain may also be caused by many other things, including:  Overuse or muscle strain, especially if you are not in shape. This is the most common cause of muscle pain.  Injury.  Bruises.  Viruses, such as the flu.  Infectious diseases.  A chronic condition that causes muscle tenderness, fatigue, and headache (fibromyalgia).  A condition, such as lupus, in which the body's disease-fighting system attacks other organs in the body (autoimmune or rheumatologic diseases).  Certain drugs, including ACE inhibitors and statins.  To diagnose the cause of your muscle pain, your health care provider will do a physical exam and ask questions about the pain and when it began. If you have not had muscle pain for very long, your health care provider may want to wait before doing much testing. If your muscle pain has lasted a long time, your health care provider may want to run tests right away. In some cases, this may include tests to rule out certain conditions or illnesses. Treatment for muscle pain depends on the cause. Home care is often enough to relieve muscle pain. Your health care provider may also prescribe anti-inflammatory medicine. Follow these instructions at home: Activity  If overuse is causing your muscle pain: ? Slow down your activities until the pain goes away. ? Do regular, gentle exercises if you are not usually active. ? Warm up before  exercising. Stretch before and after exercising. This can help lower the risk of muscle pain.  Do not continue working out if the pain is very bad. Bad pain could mean that you have injured a muscle. Managing pain and discomfort   If directed, apply ice to the sore muscle: ? Put ice in a plastic bag. ? Place a towel between your skin and the bag. ? Leave the ice on for 20 minutes, 2-3 times a day.  You may also alternate between applying ice and applying heat as told by your health care provider. To apply heat, use the heat source that your health care provider recommends, such as a moist heat pack or a heating pad. ? Place a towel between your skin and the heat source. ? Leave the heat on for 20-30 minutes. ? Remove the heat if your skin turns bright red. This is especially important if you are unable to feel pain, heat, or cold. You may have a greater risk of getting burned. Medicines  Take over-the-counter and prescription medicines only as told by your health care provider.  Do not drive or use heavy machinery while taking prescription pain medicine. Contact a health care provider if:  Your muscle pain gets worse and medicines do not help.  You have muscle pain that lasts longer than 3 days.  You have a rash or fever along with muscle pain.  You have muscle pain after a tick bite.  You have muscle pain while  working out, even though you are in good physical condition.  You have redness, soreness, or swelling along with muscle pain.  You have muscle pain after starting a new medicine or changing the dose of a medicine. Get help right away if:  You have trouble breathing.  You have trouble swallowing.  You have muscle pain along with a stiff neck, fever, and vomiting.  You have severe muscle weakness or cannot move part of your body. This information is not intended to replace advice given to you by your health care provider. Make sure you discuss any questions you have  with your health care provider. Document Released: 07/10/2006 Document Revised: 03/07/2016 Document Reviewed: 01/08/2016 Elsevier Interactive Patient Education  2018 [employer].

## 2017-06-22 NOTE — Progress Notes (Signed)
Subjective:    I'm seeing this patient as a consultation for:  Carlis Stable, PA-C   CC: Arm pain  HPI: Patient has bilateral upper arm pain occurring for a few weeks now.  He denies any injury or radiating pain.  He denies any change in activity level.  He works as a Music therapist.  He does however note a new medication.  He was diagnosed with hepatitis C previously and recently was started on Harvoni he notes that he has been on this medication has been having  pain in his upper arms.  He also notes   Some numbness and tingling into his hands bilaterally.  This is been ongoing for years and is not new.  He has been told this is cubital tunnel or carpal tunnel syndrome.  He denies any fevers or chills nausea vomiting diarrhea and feels well otherwise.  Past medical history, Surgical history, Family history not pertinant except as noted below, Social history, Allergies, and medications have been entered into the medical record, reviewed, and no changes needed.   Review of Systems: No headache, visual changes, nausea, vomiting, diarrhea, constipation, dizziness, abdominal pain, skin rash, fevers, chills, night sweats, weight loss, swollen lymph nodes, body aches, joint swelling, muscle aches, chest pain, shortness of breath, mood changes, visual or auditory hallucinations.   Objective:    Vitals:   06/22/17 1514  BP: 127/85  Pulse: 80   General: Well Developed, well nourished, and in no acute distress.  Neuro/Psych: Alert and oriented x3, extra-ocular muscles intact, able to move all 4 extremities, sensation grossly intact. Skin: Warm and dry, no rashes noted.  Respiratory: Not using accessory muscles, speaking in full sentences, trachea midline.  Cardiovascular: Pulses palpable, no extremity edema. Abdomen: Does not appear distended. MSK:  C-spine nontender to midline normal neck motion Trapezius is nontender. Shoulders bilaterally are nontender at the acromioclavicular  joint. Shoulder motion is intact. Negative impingement testing bilaterally Shoulder strength is intact bilaterally. Upper extremities are tender to palpation near the insertion of the deltoid bilaterally. The bicep is nontender bilaterally. Strength is intact pain is not reproducible with resistance testing.  Pulses capillary refill and sensation are intact bilaterally.  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  Results for orders placed or performed in visit on 06/22/17 (from the past 24 hour(s))  CBC     Status: Abnormal   Collection Time: 06/22/17  3:41 PM  Result Value Ref Range   WBC 6.2 3.8 - 10.8 Thousand/uL   RBC 4.58 4.20 - 5.80 Million/uL   Hemoglobin 13.6 13.2 - 17.1 g/dL   HCT 67.6 (L) 72.0 - 94.7 %   MCV 83.0 80.0 - 100.0 fL   MCH 29.7 27.0 - 33.0 pg   MCHC 35.8 32.0 - 36.0 g/dL   RDW 09.6 28.3 - 66.2 %   Platelets 241 140 - 400 Thousand/uL   MPV 10.7 7.5 - 12.5 fL  COMPLETE METABOLIC PANEL WITH GFR     Status: None   Collection Time: 06/22/17  3:41 PM  Result Value Ref Range   Glucose, Bld 78 65 - 99 mg/dL   BUN 21 7 - 25 mg/dL   Creat 9.47 6.54 - 6.50 mg/dL   GFR, Est Non African American 98 > OR = 60 mL/min/1.72m2   GFR, Est African American 114 > OR = 60 mL/min/1.92m2   BUN/Creatinine Ratio NOT APPLICABLE 6 - 22 (calc)   Sodium 137 135 - 146 mmol/L   Potassium 3.7 3.5 -  5.3 mmol/L   Chloride 101 98 - 110 mmol/L   CO2 30 20 - 32 mmol/L   Calcium 9.6 8.6 - 10.3 mg/dL   Total Protein 6.6 6.1 - 8.1 g/dL   Albumin 4.2 3.6 - 5.1 g/dL   Globulin 2.4 1.9 - 3.7 g/dL (calc)   AG Ratio 1.8 1.0 - 2.5 (calc)   Total Bilirubin 1.0 0.2 - 1.2 mg/dL   Alkaline phosphatase (APISO) 102 40 - 115 U/L   AST 25 10 - 35 U/L   ALT 23 9 - 46 U/L  CK     Status: None   Collection Time: 06/22/17  3:41 PM  Result Value Ref Range   Total CK 169 44 - 196 U/L  Sedimentation rate     Status: None   Collection Time: 06/22/17  3:41 PM  Result Value Ref Range    Sed Rate 9 0 - 20 mm/h   Dg Humerus Right  Result Date: 06/22/2017 CLINICAL DATA:  Chronic bilateral upper arm pain.  No known injury. EXAM: RIGHT HUMERUS - 2+ VIEW COMPARISON:  None. FINDINGS: No acute bony or joint abnormality is seen. Mild to moderate acromioclavicular osteoarthritis is noted. Soft tissues appear normal. IMPRESSION: Mild to moderate acromioclavicular osteoarthritis. Otherwise negative. Electronically Signed   By: Drusilla Kanner M.D.   On: 06/22/2017 16:54    Impression and Recommendations:    Assessment and Plan: 56 y.o. male with upper arm pain.  Unclear etiology.  The location of the pain is not specific for any discrete orthopedic issue that I can think of at this time.  Initial imaging of the right more symptomatic side is unremarkable.  Additionally the laboratory workup listed above is also not vaguely unremarkable.  I think it is possible he is experiencing a side effect of Harvoni (myalgia).   We discussed options.  Plan for trial of some symptomatic management with topical diclofenac gel and oral naproxen.  Will recheck in a few weeks if not better likely will try targeted injections of the tender spot.   Orders Placed This Encounter  Procedures  . DG Humerus Right    Standing Status:   Future    Number of Occurrences:   1    Standing Expiration Date:   08/22/2018    Order Specific Question:   Reason for Exam (SYMPTOM  OR DIAGNOSIS REQUIRED)    Answer:   Arm pain.    Order Specific Question:   Preferred imaging location?    Answer:   [facility]  . CBC  . COMPLETE METABOLIC PANEL WITH GFR  . CK  . Sedimentation rate   Meds ordered this encounter  Medications  . diclofenac sodium (VOLTAREN) 1 % GEL    Sig: Apply 4 g topically 4 (four) times daily. To affected joint.    Dispense:  100 g    Refill:  11  . naproxen (NAPROSYN) 500 MG tablet    Sig: Take 1 tablet (500 mg total) by mouth 2 (two) times daily with a meal.    Dispense:  30 tablet     Refill:  0    Discussed warning signs or symptoms. Please see discharge instructions. Patient expresses understanding.

## 2017-06-23 ENCOUNTER — Ambulatory Visit: Payer: 59

## 2017-06-23 LAB — COMPLETE METABOLIC PANEL WITH GFR
AG Ratio: 1.8 (calc) (ref 1.0–2.5)
ALBUMIN MSPROF: 4.2 g/dL (ref 3.6–5.1)
ALKALINE PHOSPHATASE (APISO): 102 U/L (ref 40–115)
ALT: 23 U/L (ref 9–46)
AST: 25 U/L (ref 10–35)
BILIRUBIN TOTAL: 1 mg/dL (ref 0.2–1.2)
BUN: 21 mg/dL (ref 7–25)
CO2: 30 mmol/L (ref 20–32)
CREATININE: 0.83 mg/dL (ref 0.70–1.33)
Calcium: 9.6 mg/dL (ref 8.6–10.3)
Chloride: 101 mmol/L (ref 98–110)
GFR, Est African American: 114 mL/min/{1.73_m2} (ref >=60)
GFR, Est Non African American: 98 mL/min/{1.73_m2} (ref >=60)
GLOBULIN: 2.4 g/dL (ref 1.9–3.7)
Glucose, Bld: 78 mg/dL (ref 65–99)
Potassium: 3.7 mmol/L (ref 3.5–5.3)
SODIUM: 137 mmol/L (ref 135–146)
Total Protein: 6.6 g/dL (ref 6.1–8.1)

## 2017-06-23 LAB — CBC
HEMATOCRIT: 38 % — AB (ref 38.5–50.0)
HEMOGLOBIN: 13.6 g/dL (ref 13.2–17.1)
MCH: 29.7 pg (ref 27.0–33.0)
MCHC: 35.8 g/dL (ref 32.0–36.0)
MCV: 83 fL (ref 80.0–100.0)
MPV: 10.7 fL (ref 7.5–12.5)
Platelets: 241 10*3/uL (ref 140–400)
RBC: 4.58 10*6/uL (ref 4.20–5.80)
RDW: 11.7 % (ref 11.0–15.0)
WBC: 6.2 10*3/uL (ref 3.8–10.8)

## 2017-06-23 LAB — SEDIMENTATION RATE: SED RATE: 9 mm/h (ref 0–20)

## 2017-06-23 LAB — CK: CK TOTAL: 169 U/L (ref 44–196)

## 2017-06-24 ENCOUNTER — Ambulatory Visit (INDEPENDENT_AMBULATORY_CARE_PROVIDER_SITE_OTHER): Payer: 59 | Admitting: Pharmacist

## 2017-06-24 DIAGNOSIS — B182 Chronic viral hepatitis C: Secondary | ICD-10-CM

## 2017-06-24 NOTE — Progress Notes (Signed)
HPI: Melvin Rich is a 56 y.o. male who presents to the [facility] pharmacy clinic for Hep C follow-up. He has genotype 1a, F0/F1, and started 8 weeks of Harvoni on 9/23.   Lab Results  Component Value Date   HCVGENOTYPE 1a 05/01/2017    Allergies: No Known Allergies  Past Medical History: Past Medical History:  Diagnosis Date  . Alcohol use disorder   . Chronic active hepatitis (HCC) 04/23/2017  . Chronic hepatitis C virus infection (HCC) 04/23/2017  . Degenerative disc disease, cervical   . Degenerative disc disease, cervical   . History of drug use disorder    Heroin, IVDA  . Hypertension   . Rheumatoid arthritis (HCC)   . Substance abuse (HCC)   . Transaminitis 04/23/2017    Social History: Social History   Social History  . Marital status: Married    Spouse name: N/A  . Number of children: N/A  . Years of education: N/A   Social History Main Topics  . Smoking status: Current Every Day Smoker    Packs/day: 0.50    Years: 45.00    Types: Cigarettes  . Smokeless tobacco: Never Used  . Alcohol use 1.8 - 2.4 oz/week    3 - 4 Cans of beer per week     Comment: stopped 3 weeks ago  . Drug use: No  . Sexual activity: Yes   Other Topics Concern  . Not on file   Social History Narrative  . No narrative on file    Labs: Hepatitis B Surface Ag (no units)  Date Value  04/20/2017 NON-REACTIVE   HCV Ab (no units)  Date Value  04/20/2017 REACTIVE (A)    Lab Results  Component Value Date   HCVGENOTYPE 1a 05/01/2017    Hepatitis C RNA quantitative Latest Ref Rng & Units 04/20/2017  HCV Quantitative NOT DETECTED IU/mL 260,000(H)  HCV Quantitative Log NOT DETECTED Log IU/mL 5.41(H)    AST (U/L)  Date Value  06/22/2017 25  04/20/2017 48 (H)   ALT (U/L)  Date Value  06/22/2017 23  04/20/2017 63 (H)    CrCl: Estimated Creatinine Clearance: 98.3 mL/min (by C-G formula based on SCr of 0.83 mg/dL).  Fibrosis Score: F0/F1 as assessed by ARFI    Child-Pugh Score: A  Previous Treatment Regimen: None  Assessment: Melvin Rich is here today to follow-up for his Hep C infection.  He started 8 weeks of his Harvoni on 9/23.  He is having some myalgias that he is attributing to Harvoni.  He said he quit drinking as well so that may be causing some pain too. His PCP prescribed some gabapentin and voltaren gel. Otherwise, no usual side effects - no n/v/d, headcahes, or fatigue.  He has not missed a single dose since starting and takes it every evening around 6pm. He has had no trouble getting his medications from mail order pharmacy.  Will check a Hep C viral load today and bring him back next month at [facility].    Plans:  - Continue Harvoni x 8 weeks - Hep C RNA today - F/u with me again 11/27 at 4pm  Melvin Rich, PharmD, CPP Infectious Diseases Clinical Pharmacist [facility] 06/24/2017, 4:00 PM

## 2017-06-25 ENCOUNTER — Encounter: Payer: Self-pay | Admitting: Internal Medicine

## 2017-06-26 ENCOUNTER — Telehealth: Payer: Self-pay | Admitting: Pharmacist

## 2017-06-26 LAB — HEPATITIS C RNA QUANTITATIVE
HCV Quantitative Log: <1.18 Log IU/mL
HCV RNA, PCR, QN: <15 IU/mL

## 2017-06-26 NOTE — Telephone Encounter (Signed)
Handled.  Thanks!

## 2017-06-26 NOTE — Telephone Encounter (Signed)
Called patient to let him know his lab work looked good and his viral load was already undetectable. He will finish 8 weeks of Harvoni next month.

## 2017-06-29 ENCOUNTER — Encounter: Payer: Self-pay | Admitting: Family Medicine

## 2017-06-30 ENCOUNTER — Ambulatory Visit (INDEPENDENT_AMBULATORY_CARE_PROVIDER_SITE_OTHER): Payer: 59 | Admitting: Family Medicine

## 2017-06-30 ENCOUNTER — Encounter: Payer: Self-pay | Admitting: Family Medicine

## 2017-06-30 VITALS — BP 113/86 | HR 76 | Wt 156.0 lb

## 2017-06-30 DIAGNOSIS — M79602 Pain in left arm: Secondary | ICD-10-CM | POA: Diagnosis not present

## 2017-06-30 DIAGNOSIS — M79601 Pain in right arm: Secondary | ICD-10-CM | POA: Diagnosis not present

## 2017-06-30 MED ORDER — PREDNISONE 5 MG (48) PO TBPK: ORAL_TABLET | ORAL | 0 refills | Status: DC

## 2017-06-30 MED ORDER — GABAPENTIN 300 MG PO CAPS: ORAL_CAPSULE | ORAL | 3 refills | Status: DC

## 2017-06-30 NOTE — Progress Notes (Signed)
Melvin Rich is a 56 y.o. male who presents to [facility] today for follow-up arm pain. Patient was seen October 22 for bilateral upper extremity myalgia. He notes continued symptoms that are quite obnoxious. Additionally he notes numbness and tingling into his radial hand bilaterally as well. This is worse in the morning and better during the day. He denies any fevers chills nausea vomiting or diarrhea. He currently is taking Harvoni for hepatitis C.    Past Medical History:  Diagnosis Date  . Alcohol use disorder   . Chronic active hepatitis (HCC) 04/23/2017  . Chronic hepatitis C virus infection (HCC) 04/23/2017  . Degenerative disc disease, cervical   . Degenerative disc disease, cervical   . History of drug use disorder    Heroin, IVDA  . Hypertension   . Rheumatoid arthritis (HCC)   . Substance abuse (HCC)   . Transaminitis 04/23/2017   Past Surgical History:  Procedure Laterality Date  . HIP SURGERY  2005  . SHOULDER SURGERY  1985   Social History  Substance Use Topics  . Smoking status: Current Every Day Smoker    Packs/day: 0.50    Years: 45.00    Types: Cigarettes  . Smokeless tobacco: Never Used  . Alcohol use 1.8 - 2.4 oz/week    3 - 4 Cans of beer per week     Comment: stopped 3 weeks ago     ROS:  As above   Medications: Current Outpatient Prescriptions  Medication Sig Dispense Refill  . aspirin 81 MG tablet Take 1 tablet (81 mg total) by mouth daily. 30 tablet 11  . candesartan-hydrochlorothiazide (ATACAND HCT) 16-12.5 MG tablet Take 1 tablet by mouth daily.    . diclofenac sodium (VOLTAREN) 1 % GEL Apply 4 g topically 4 (four) times daily. To affected joint. 100 g 11  . Ledipasvir-Sofosbuvir (HARVONI) 90-400 MG TABS Take 1 tablet by mouth daily. 28 tablet 2  . losartan-hydrochlorothiazide (HYZAAR) 100-25 MG tablet Take 1 tablet by mouth daily. 90 tablet 1  . naproxen (NAPROSYN) 500 MG tablet Take 1  tablet (500 mg total) by mouth 2 (two) times daily with a meal. 30 tablet 0  . gabapentin (NEURONTIN) 300 MG capsule One tab PO qHS for a week, then BID for a week, then TID. May double weekly to a max of 3,600mg /day 180 capsule 3  . predniSONE (STERAPRED UNI-PAK 48 TAB) 5 MG (48) TBPK tablet 12 day dosepack po 48 tablet 0   No current facility-administered medications for this visit.    No Known Allergies   Exam:  BP 113/86   Pulse 76   Wt 156 lb (70.8 kg)   BMI 23.04 kg/m  General: Well Developed, well nourished, and in no acute distress.  Neuro/Psych: Alert and oriented x3, extra-ocular muscles intact, able to move all 4 extremities, sensation grossly intact. Skin: Warm and dry, no rashes noted.  Respiratory: Not using accessory muscles, speaking in full sentences, trachea midline.  Cardiovascular: Pulses palpable, no extremity edema. Abdomen: Does not appear distended. MSK:  Tender palpation bilateral upper extremities approximately near the origin of the deltoid. Negative Tinel's and Phalen's bilateral cubital carpal tunnels. Extremity strength is intact bilaterally including wrist extension  Limited musculoskeletal ultrasound of the tender area in the upper arm reveals the radial nerve just superficial to the humerus bilaterally. The nerve is normal-appearing with no surrounding compression visible on ultrasound.    No results found for this or any previous  visit (from the past 48 hour(s)). No results found.    Assessment and Plan: 56 y.o. male with  Upper extremity pain. Patient is tender right along the radial nerve. However he does not have reproducible paresthesias or weakness with palpation of this area. I'm not sure why all this and is hurting in this area.   Plan for trial of prednisone Dosepak as well as gabapentin and recheck in about a month. Return sooner if needed.    No orders of the defined types were placed in this encounter.  Meds ordered this  encounter  Medications  . gabapentin (NEURONTIN) 300 MG capsule    Sig: One tab PO qHS for a week, then BID for a week, then TID. May double weekly to a max of 3,600mg /day    Dispense:  180 capsule    Refill:  3  . predniSONE (STERAPRED UNI-PAK 48 TAB) 5 MG (48) TBPK tablet    Sig: 12 day dosepack po    Dispense:  48 tablet    Refill:  0    Discussed warning signs or symptoms. Please see discharge instructions. Patient expresses understanding.

## 2017-06-30 NOTE — Patient Instructions (Signed)
Thank you for coming in today. Take gabapentin.  Use prednisone.  If not better we will injection around those nerve.  I will discuss this with colleges and do a literature search.   I think this is probably due to [facility]

## 2017-07-02 ENCOUNTER — Encounter: Payer: Self-pay | Admitting: Family Medicine

## 2017-07-15 ENCOUNTER — Encounter: Payer: Self-pay | Admitting: Physician Assistant

## 2017-07-15 DIAGNOSIS — N529 Male erectile dysfunction, unspecified: Secondary | ICD-10-CM

## 2017-07-16 MED ORDER — SILDENAFIL CITRATE 20 MG PO TABS: ORAL_TABLET | ORAL | 2 refills | Status: DC

## 2017-07-28 ENCOUNTER — Ambulatory Visit (INDEPENDENT_AMBULATORY_CARE_PROVIDER_SITE_OTHER): Payer: 59 | Admitting: Pharmacist

## 2017-07-28 DIAGNOSIS — B182 Chronic viral hepatitis C: Secondary | ICD-10-CM | POA: Diagnosis not present

## 2017-07-28 NOTE — Progress Notes (Signed)
HPI: Melvin Rich is a 56 y.o. male who presents to the [facility] pharmacy clinic for Hep C follow-up.  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  Lab Results  Component Value Date   HCVGENOTYPE 1a 05/01/2017    Allergies: No Known Allergies  Past Medical History: Past Medical History:  Diagnosis Date  . Alcohol use disorder   . Chronic active hepatitis (HCC) 04/23/2017  . Chronic hepatitis C virus infection (HCC) 04/23/2017  . Degenerative disc disease, cervical   . Degenerative disc disease, cervical   . History of drug use disorder    Heroin, IVDA  . Hypertension   . Rheumatoid arthritis (HCC)   . Substance abuse (HCC)   . Transaminitis 04/23/2017    Social History: Social History   Socioeconomic History  . Marital status: Married    Spouse name: Not on file  . Number of children: Not on file  . Years of education: Not on file  . Highest education level: Not on file  Social Needs  . Financial resource strain: Not on file  . Food insecurity - worry: Not on file  . Food insecurity - inability: Not on file  . Transportation needs - medical: Not on file  . Transportation needs - non-medical: Not on file  Occupational History  . Not on file  Tobacco Use  . Smoking status: Current Every Day Smoker    Packs/day: 0.50    Years: 45.00    Pack years: 22.50    Types: Cigarettes  . Smokeless tobacco: Never Used  Substance and Sexual Activity  . Alcohol use: Yes    Alcohol/week: 1.8 - 2.4 oz    Types: 3 - 4 Cans of beer per week    Comment: stopped 3 weeks ago  . Drug use: No  . Sexual activity: Yes  Other Topics Concern  . Not on file  Social History Narrative  . Not on file    Labs: Hepatitis B Surface Ag (no units)  Date Value  04/20/2017 NON-REACTIVE   HCV Ab (no units)  Date Value  04/20/2017 REACTIVE (A)    Lab Results  Component Value Date   HCVGENOTYPE 1a 05/01/2017    Hepatitis C RNA quantitative Latest Ref  Rng & Units 06/24/2017 04/20/2017  HCV Quantitative NOT DETECTED IU/mL - 260,000(H)  HCV Quantitative Log NOT DETECT Log IU/mL <1.18 NOT DETECTED 5.41(H)    AST (U/L)  Date Value  06/22/2017 25  04/20/2017 48 (H)   ALT (U/L)  Date Value  06/22/2017 23  04/20/2017 63 (H)    CrCl: CrCl cannot be calculated (Patient's most recent lab result is older than the maximum 21 days allowed.).  Fibrosis Score: F0/F1 as assessed by ARFI   Child-Pugh Score: A  Previous Treatment Regimen: None  Assessment: Melvin Rich is here today for Hep C follow-up. He completed 8 weeks of Harvoni around a week and a half ago.  He had absolutely no issues and no missed doses.  He is doing very well.  His early on treatment viral load was already undetectable.  I will repeat labs today and bring him back in 3 months for his cure visit.    Plans: - Completed 8 week of Harvoni - HCV RNA today - F/u with me again for cure visit 10/27/17 at 4pm  Cassie L. Kuppelweiser, PharmD, AAHIVP, CPP Infectious Diseases Clinical Pharmacist [facility] 07/28/2017, 3:50 PM

## 2017-07-30 LAB — HEPATITIS C RNA QUANTITATIVE
HCV QUANT LOG: NOT DETECTED {Log_IU}/mL
HCV RNA, PCR, QN: NOT DETECTED [IU]/mL

## 2017-08-02 LAB — COLOGUARD: Cologuard: NEGATIVE

## 2017-08-03 ENCOUNTER — Encounter: Payer: Self-pay | Admitting: Physician Assistant

## 2017-08-03 DIAGNOSIS — Z1211 Encounter for screening for malignant neoplasm of colon: Secondary | ICD-10-CM | POA: Insufficient documentation

## 2017-08-03 NOTE — Telephone Encounter (Signed)
Pt notified of cologuard results. KG LPN

## 2017-08-04 ENCOUNTER — Telehealth: Payer: Self-pay | Admitting: *Deleted

## 2017-08-04 NOTE — Telephone Encounter (Signed)
Patient has already been notified.

## 2017-08-04 NOTE — Telephone Encounter (Signed)
-----   Message from [facility] [place] sent at 08/03/2017 11:53 AM EST ----- Your Cologuard test was negative. Recommend you repeat this in 3 years for routine colon cancer screening. Best, Vinetta Bergamo

## 2017-08-15 NOTE — Progress Notes (Signed)
09/22/16

## 2017-09-03 ENCOUNTER — Encounter: Payer: Self-pay | Admitting: Physician Assistant

## 2017-10-27 ENCOUNTER — Ambulatory Visit (INDEPENDENT_AMBULATORY_CARE_PROVIDER_SITE_OTHER): Payer: 59 | Admitting: Pharmacist

## 2017-10-27 DIAGNOSIS — B182 Chronic viral hepatitis C: Secondary | ICD-10-CM | POA: Diagnosis not present

## 2017-10-27 NOTE — Progress Notes (Signed)
[facility] Pharmacy Visit  HPI: Melvin Rich is a 57 y.o. male who presents to the [facility] pharmacy clinic for his Hep C cure visit.  Patient Active Problem List   Diagnosis Date Noted  . Colon cancer screening 08/03/2017  . Poor dentition 05/05/2017  . 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 Chronic hepatitis C virus infection (HCC) 04/23/2017  . Transaminitis 04/23/2017  . Uncontrolled stage 2 hypertension 04/18/2017  . Tobacco use disorder 04/18/2017  . Drinks three beers per day 04/18/2017  . DDD (degenerative disc disease), cervical 04/18/2017  . Rheumatoid arthritis involving both elbows (HCC) 04/18/2017  . H/O drug abuse 07/27/2012    Patient's Medications  New Prescriptions   No medications on file  Previous Medications   ASPIRIN 81 MG TABLET    Take 1 tablet (81 mg total) by mouth daily.   CANDESARTAN-HYDROCHLOROTHIAZIDE (ATACAND HCT) 16-12.5 MG TABLET    Take 1 tablet by mouth daily.   DICLOFENAC SODIUM (VOLTAREN) 1 % GEL    Apply 4 g topically 4 (four) times daily. To affected joint.   GABAPENTIN (NEURONTIN) 300 MG CAPSULE    One tab PO qHS for a week, then BID for a week, then TID. May double weekly to a max of 3,600mg /day   LEDIPASVIR-SOFOSBUVIR (HARVONI) 90-400 MG TABS    Take 1 tablet by mouth daily.   LOSARTAN-HYDROCHLOROTHIAZIDE (HYZAAR) 100-25 MG TABLET    Take 1 tablet by mouth daily.   NAPROXEN (NAPROSYN) 500 MG TABLET    Take 1 tablet (500 mg total) by mouth 2 (two) times daily with a meal.   PREDNISONE (STERAPRED UNI-PAK 48 TAB) 5 MG (48) TBPK TABLET    12 day dosepack po   SILDENAFIL (REVATIO) 20 MG TABLET    Take 1-5 tabs PO 1 hour prior to sexual activity  Modified Medications   No medications on file  Discontinued Medications   No medications on file    Allergies: No Known Allergies  Past Medical History: Past Medical History:  Diagnosis Date  . Alcohol use disorder   .  Chronic active hepatitis (HCC) 04/23/2017  . Chronic hepatitis C virus infection (HCC) 04/23/2017  . Degenerative disc disease, cervical   . Degenerative disc disease, cervical   . History of drug use disorder    Heroin, IVDA  . Hypertension   . Rheumatoid arthritis (HCC)   . Substance abuse (HCC)   . Transaminitis 04/23/2017    Social History: Social History   Socioeconomic History  . Marital status: Married    Spouse name: Not on file  . Number of children: Not on file  . Years of education: Not on file  . Highest education level: Not on file  Social Needs  . Financial resource strain: Not on file  . Food insecurity - worry: Not on file  . Food insecurity - inability: Not on file  . Transportation needs - medical: Not on file  . Transportation needs - non-medical: Not on file  Occupational History  . Not on file  Tobacco Use  . Smoking status: Current Every Day Smoker    Packs/day: 0.50    Years: 45.00    Pack years: 22.50    Types: Cigarettes  . Smokeless tobacco: Never Used  Substance and Sexual Activity  . Alcohol use: Yes    Alcohol/week: 1.8 - 2.4 oz    Types: 3 - 4 Cans of beer per week  Comment: stopped 3 weeks ago  . Drug use: No  . Sexual activity: Yes  Other Topics Concern  . Not on file  Social History Narrative  . Not on file    Labs: Hepatitis B Surface Ag (no units)  Date Value  04/20/2017 NON-REACTIVE   HCV Ab (no units)  Date Value  04/20/2017 REACTIVE (A)    Lab Results  Component Value Date   HCVGENOTYPE 1a 05/01/2017    Hepatitis C RNA quantitative Latest Ref Rng & Units 07/28/2017 06/24/2017 04/20/2017  HCV Quantitative NOT DETECTED IU/mL - - 260,000(H)  HCV Quantitative Log NOT DETECT Log IU/mL <1.18 NOT DETECTED <1.18 NOT DETECTED 5.41(H)    AST (U/L)  Date Value  06/22/2017 25  04/20/2017 48 (H)   ALT (U/L)  Date Value  06/22/2017 23  04/20/2017 63 (H)    Fibrosis Score: F0/F1 as assessed by ARFI    Assessment: Melvin Rich is here to get his Hep C cure labs.  He finished 8 weeks of Harvoni back in November. I will call him when his lab work returns.   Plan: - Hep C RNA for cure  Cassie L. Kuppelweiser, PharmD, AAHIVP, CPP Infectious Diseases Clinical Pharmacist [facility] 10/27/2017, 3:40 PM

## 2017-10-27 NOTE — Addendum Note (Signed)
Addended by: Robinette Haines on: 10/27/2017 03:55 PM   Modules accepted: Orders

## 2017-10-29 ENCOUNTER — Telehealth: Payer: Self-pay | Admitting: Pharmacist

## 2017-10-29 LAB — HEPATITIS C RNA QUANTITATIVE
HCV Quantitative Log: <1.18 Log IU/mL
HCV RNA, PCR, QN: <15 IU/mL

## 2017-10-29 NOTE — Telephone Encounter (Signed)
Called Melvin Rich to let him know that his SVR12 Hep C viral load was undetectable and he was cured of his Hep C infection.

## 2017-11-07 ENCOUNTER — Emergency Department (INDEPENDENT_AMBULATORY_CARE_PROVIDER_SITE_OTHER): Payer: 59

## 2017-11-07 ENCOUNTER — Emergency Department
Admission: EM | Admit: 2017-11-07 | Discharge: 2017-11-07 | Disposition: A | Discharge status: Home / Self Care | Payer: 59 | Source: Home / Self Care | Attending: Emergency Medicine | Admitting: Emergency Medicine

## 2017-11-07 ENCOUNTER — Encounter: Payer: Self-pay | Admitting: Emergency Medicine

## 2017-11-07 ENCOUNTER — Encounter: Payer: Self-pay | Admitting: Physician Assistant

## 2017-11-07 DIAGNOSIS — M47894 Other spondylosis, thoracic region: Secondary | ICD-10-CM | POA: Diagnosis not present

## 2017-11-07 DIAGNOSIS — M47812 Spondylosis without myelopathy or radiculopathy, cervical region: Secondary | ICD-10-CM

## 2017-11-07 DIAGNOSIS — M546 Pain in thoracic spine: Secondary | ICD-10-CM | POA: Diagnosis not present

## 2017-11-07 DIAGNOSIS — R52 Pain, unspecified: Secondary | ICD-10-CM

## 2017-11-07 MED ORDER — METHOCARBAMOL 750 MG PO TABS: 750.0000 mg | ORAL_TABLET | Freq: Four times a day (QID) | ORAL | 0 refills | Status: DC

## 2017-11-07 MED ORDER — PREDNISONE 20 MG PO TABS: ORAL_TABLET | ORAL | 0 refills | Status: DC

## 2017-11-07 NOTE — ED Triage Notes (Signed)
Patient presents to [facility] with C/O back pain mid right back radiates through ribs and to lower abd. Pain times  7 days after helping someone to move . Rates pain 8/10

## 2017-11-07 NOTE — ED Triage Notes (Signed)
Patient presents to [facility] with C/O pain in the upper back since yesterday. Rates pain 5/10 sharp and shooting pain. Pain after lifting a box.

## 2017-11-07 NOTE — ED Provider Notes (Signed)
[facility]    CSN: [id number] Arrival date & time: 11/07/17  0941     History   Chief Complaint Chief Complaint  Patient presents with  . Back Pain    HPI PERRIN GENS is a 57 y.o. male.  Patient states he was in his usual state of health until yesterday while lifting he developed a knifelike shooting sensation on the right side of his neckand upper back. He has a history of arthritis in his neck and upper back. HPI  Past Medical History:  Diagnosis Date  . Alcohol use disorder   . Chronic active hepatitis (HCC) 04/23/2017  . Chronic hepatitis C virus infection (HCC) 04/23/2017  . Degenerative disc disease, cervical   . Degenerative disc disease, cervical   . History of drug use disorder    Heroin, IVDA  . Hypertension   . Rheumatoid arthritis (HCC)   . Substance abuse (HCC)   . Transaminitis 04/23/2017    Patient Active Problem List   Diagnosis Date Noted  . Colon cancer screening 08/03/2017  . Poor dentition 05/05/2017  . 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 Chronic hepatitis C virus infection (HCC) 04/23/2017  . Transaminitis 04/23/2017  . Uncontrolled stage 2 hypertension 04/18/2017  . Tobacco use disorder 04/18/2017  . Drinks three beers per day 04/18/2017  . DDD (degenerative disc disease), cervical 04/18/2017  . Rheumatoid arthritis involving both elbows (HCC) 04/18/2017  . H/O drug abuse 07/27/2012    Past Surgical History:  Procedure Laterality Date  . HIP SURGERY  2005  . SHOULDER SURGERY  1985       Home Medications    Prior to Admission medications   Medication Sig Start Date End Date Taking? Authorizing Provider  aspirin 81 MG tablet Take 1 tablet (81 mg total) by mouth daily. 04/17/17   Carlis Stable, PA-C  candesartan-hydrochlorothiazide (ATACAND HCT) 16-12.5 MG tablet Take 1 tablet by mouth daily.    [provider]  diclofenac sodium (VOLTAREN) 1 %  GEL Apply 4 g topically 4 (four) times daily. To affected joint. 06/22/17   Rodolph Bong, MD  gabapentin (NEURONTIN) 300 MG capsule One tab PO qHS for a week, then BID for a week, then TID. May double weekly to a max of 3,600mg /day 06/30/17   Rodolph Bong, MD  losartan-hydrochlorothiazide (HYZAAR) 100-25 MG tablet Take 1 tablet by mouth daily. 05/01/17   Carlis Stable, PA-C  methocarbamol (ROBAXIN) 750 MG tablet Take 1 tablet (750 mg total) by mouth 4 (four) times daily. 11/07/17   Collene Gobble, MD  naproxen (NAPROSYN) 500 MG tablet Take 1 tablet (500 mg total) by mouth 2 (two) times daily with a meal. 06/22/17   Rodolph Bong, MD  predniSONE (DELTASONE) 20 MG tablet Take 3 PO QAM x3days, 2 PO QAM x3days, 1 PO QAM x3days 11/07/17   Collene Gobble, MD  sildenafil (REVATIO) 20 MG tablet Take 1-5 tabs PO 1 hour prior to sexual activity 07/16/17   Carlis Stable, PA-C    Family History Family History  Adopted: Yes  Family history unknown: Yes    Social History Social History   Tobacco Use  . Smoking status: Current Every Day Smoker    Packs/day: 0.50    Years: 45.00    Pack years: 22.50    Types: Cigarettes  . Smokeless tobacco: Never Used  Substance Use Topics  . Alcohol use: Yes  Alcohol/week: 2.4 oz    Types: 4 Cans of beer per week    Comment: 20 beers per week  . Drug use: No     Allergies   Patient has no known allergies.   Review of Systems Review of Systems  Constitutional: Negative.   HENT: Negative.   Respiratory: Negative.   Cardiovascular: Negative.   Gastrointestinal:       History of hep C currently in remission.Still drinks 2-3 beers a day  Psychiatric/Behavioral:       History of drug use, none for 15 years     Physical Exam Triage Vital Signs ED Triage Vitals  Enc Vitals Group     BP 11/07/17 1053 135/82     Pulse Rate 11/07/17 1053 68     Resp 11/07/17 1053 16     Temp 11/07/17 1053 98 F (36.7 C)     Temp Source  11/07/17 1053 Oral     SpO2 11/07/17 1053 100 %     Weight 11/07/17 1054 158 lb (71.7 kg)     Height 11/07/17 1054 5' 9.5" (1.765 m)     Head Circumference --      Peak Flow --      Pain Score 11/07/17 1008 8     Pain Loc --      Pain Edu? --      Excl. in GC? --    No data found.  Updated Vital Signs BP 135/82 (BP Location: Right Arm)   Pulse 68   Temp 98 F (36.7 C) (Oral)   Resp 16   Ht 5' 9.5" (1.765 m)   Wt 158 lb (71.7 kg)   SpO2 100%   BMI 23.00 kg/m   Visual Acuity Right Eye Distance:   Left Eye Distance:   Bilateral Distance:    Right Eye Near:   Left Eye Near:    Bilateral Near:     Physical Exam   UC Treatments / Results  Labs (all labs ordered are listed, but only abnormal results are displayed) Labs Reviewed - No data to display  EKG  EKG Interpretation None       Radiology Dg Thoracic Spine W/swimmers  Result Date: 11/07/2017 CLINICAL DATA:  Right-sided upper back pain after lifting a box yesterday. EXAM: THORACIC SPINE - 3 VIEWS COMPARISON:  None. FINDINGS: Twelve rib-bearing thoracic vertebral bodies. No acute fracture or subluxation. Vertebral body heights are preserved. Alignment is normal. Mild-to-moderate multilevel disc height loss and anterior endplate spurring. Moderate degenerative disc disease at C5-C6 and C6-C7. IMPRESSION: 1. No acute osseous abnormality. 2. Mild-to-moderate multilevel degenerative changes throughout the thoracic spine. Electronically Signed   By: Obie Dredge M.D.   On: 11/07/2017 11:21    Procedures Procedures (including critical care time)  Medications Ordered in UC Medications - No data to display   Initial Impression / Assessment and Plan / UC Course  I have reviewed the triage vital signs and the nursing notes.  Pertinent labs & imaging results that were available during my care of the patient were reviewed by me and considered in my medical decision making (see chart for details).       Final  Clinical Impressions(s) / UC Diagnoses   Final diagnoses:  Pain aggravated by changing postions  Other osteoarthritis of spine, thoracic region  Cervical arthritis    ED Discharge Orders        Ordered    predniSONE (DELTASONE) 20 MG tablet     11/07/17 1201  methocarbamol (ROBAXIN) 750 MG tablet  4 times daily     11/07/17 1201    Patient has evidence of a pinched nerve right periscapular area which I suspect is cervical in origin. Thoracic spine films done show multilevel degenerative changes. He has taken prednisone in the past with good results and will proceed treatment with a taper dose of prednisone and Robaxin.   Controlled Substance Prescriptions [place] Controlled Substance Registry consulted? Not Applicable   Collene Gobble, MD 11/07/17 [phone]

## 2017-11-07 NOTE — Discharge Instructions (Signed)
Take prednisone as instructed. Take muscle relaxant. Apply ice to the affected area.

## 2017-11-07 NOTE — ED Triage Notes (Signed)
Please disregard previous note it was charted on the wrong patient.

## 2017-11-09 ENCOUNTER — Ambulatory Visit: Payer: 59 | Admitting: Physician Assistant

## 2017-11-09 DIAGNOSIS — Z0189 Encounter for other specified special examinations: Secondary | ICD-10-CM

## 2017-12-23 ENCOUNTER — Encounter: Payer: Self-pay | Admitting: Physician Assistant

## 2017-12-23 ENCOUNTER — Ambulatory Visit: Payer: 59 | Admitting: Physician Assistant

## 2017-12-23 VITALS — BP 151/85 | HR 85 | Wt 160.0 lb

## 2017-12-23 DIAGNOSIS — N529 Male erectile dysfunction, unspecified: Secondary | ICD-10-CM | POA: Diagnosis not present

## 2017-12-23 DIAGNOSIS — Z79899 Other long term (current) drug therapy: Secondary | ICD-10-CM | POA: Diagnosis not present

## 2017-12-23 DIAGNOSIS — I10 Essential (primary) hypertension: Secondary | ICD-10-CM | POA: Diagnosis not present

## 2017-12-23 DIAGNOSIS — D649 Anemia, unspecified: Secondary | ICD-10-CM

## 2017-12-23 DIAGNOSIS — G479 Sleep disorder, unspecified: Secondary | ICD-10-CM | POA: Diagnosis not present

## 2017-12-23 DIAGNOSIS — F17290 Nicotine dependence, other tobacco product, uncomplicated: Secondary | ICD-10-CM

## 2017-12-23 DIAGNOSIS — R710 Precipitous drop in hematocrit: Secondary | ICD-10-CM

## 2017-12-23 DIAGNOSIS — R202 Paresthesia of skin: Secondary | ICD-10-CM

## 2017-12-23 LAB — CBC WITH DIFFERENTIAL/PLATELET
BASOS ABS: 30 {cells}/uL (ref 0–200)
Basophils Relative: 0.5 %
EOS ABS: 192 {cells}/uL (ref 15–500)
Eosinophils Relative: 3.2 %
HCT: 38.6 % (ref 38.5–50.0)
Hemoglobin: 13.4 g/dL (ref 13.2–17.1)
Lymphs Abs: 2250 cells/uL (ref 850–3900)
MCH: 28.9 pg (ref 27.0–33.0)
MCHC: 34.7 g/dL (ref 32.0–36.0)
MCV: 83.2 fL (ref 80.0–100.0)
MPV: 10.9 fL (ref 7.5–12.5)
Monocytes Relative: 9.8 %
NEUTROS PCT: 49 %
Neutro Abs: 2940 cells/uL (ref 1500–7800)
PLATELETS: 220 10*3/uL (ref 140–400)
RBC: 4.64 10*6/uL (ref 4.20–5.80)
RDW: 12.1 % (ref 11.0–15.0)
TOTAL LYMPHOCYTE: 37.5 %
WBC: 6 10*3/uL (ref 3.8–10.8)
WBCMIX: 588 {cells}/uL (ref 200–950)

## 2017-12-23 MED ORDER — LOSARTAN POTASSIUM-HCTZ 100-25 MG PO TABS: 1.0000 | ORAL_TABLET | Freq: Every day | ORAL | 1 refills | Status: DC

## 2017-12-23 MED ORDER — SILDENAFIL CITRATE 100 MG PO TABS: 100.0000 mg | ORAL_TABLET | ORAL | 11 refills | Status: DC | PRN

## 2017-12-23 NOTE — Progress Notes (Signed)
HPI:                                                                Melvin Rich is a 57 y.o. male who presents to [facility]: Primary Care [facility] Medicine today for medication management  Current concerns include: sleep difficulties  HTN: taking Lisinopril-HCTZ daily. Compliant with medications. Does not check BP's at home. Denies vision change, headache, chest pain with exertion, orthopnea, lightheadedness, syncope and edema. Risk factors include: tobacco use, age>55, male sex  Tobacco use: has reduced from 1 ppd to 3 cigarettes per day and is using Jewel 1 cartridge every 2-3 days  Reports premature awakenings after approximately 4 hours of sleep. No difficulty falling asleep. Reports he has always slept on the lower end, 4-6 hours. Sleep is restorative, he does not have excessive daytime sleepiness, does not snore. Wife tells him that he is restless and kicks his legs in his sleep, but this bothers her more than it bothers him.This has been worsening for the last year and he thinks it has been associated with decreased alcohol intake. He used to drink 6-8 beers at night and has reduced to 1-2 some nights. He does endorse numbness and tingling in his extremities that is fairly persistent day and night. Denies urge to move legs, burning pain, unpleasant sensations, or nocturnal leg cramps.  ED: using Sildenafil 100 mg prn. This is working well for him. Requesting refills.  Depression screen [facility] 2/9 12/23/2017 05/07/2017 05/01/2017 04/17/2017  Decreased Interest 0 0 0 0  Down, Depressed, Hopeless 0 0 0 0  PHQ - 2 Score 0 0 0 0  Altered sleeping 1 - - -  Tired, decreased energy 0 - - -  Change in appetite 0 - - -  Feeling bad or failure about yourself  0 - - -  Trouble concentrating 0 - - -  Moving slowly or fidgety/restless 0 - - -  Suicidal thoughts 0 - - -  PHQ-9 Score 1 - - -    No flowsheet data found.    Past Medical History:  Diagnosis Date  .  Alcohol use disorder   . Chronic active hepatitis (HCC) 04/23/2017  . Chronic hepatitis C virus infection (HCC) 04/23/2017  . Degenerative disc disease, cervical   . Degenerative disc disease, cervical   . History of drug use disorder    Heroin, IVDA  . Hypertension   . Rheumatoid arthritis (HCC)   . Substance abuse (HCC)   . Transaminitis 04/23/2017   Past Surgical History:  Procedure Laterality Date  . HIP SURGERY  2005  . SHOULDER SURGERY  1985   Social History   Tobacco Use  . Smoking status: Current Every Day Smoker    Packs/day: 0.50    Years: 45.00    Pack years: 22.50    Types: Cigarettes  . Smokeless tobacco: Never Used  Substance Use Topics  . Alcohol use: Yes    Alcohol/week: 2.4 oz    Types: 4 Cans of beer per week    Comment: 20 beers per week   He was adopted. Family history is unknown by patient.    ROS: negative except as noted in the HPI  Medications: Current Outpatient Medications  Medication Sig Dispense Refill  .  aspirin 81 MG tablet Take 1 tablet (81 mg total) by mouth daily. 30 tablet 11  . diclofenac sodium (VOLTAREN) 1 % GEL Apply 4 g topically 4 (four) times daily. To affected joint. 100 g 11  . losartan-hydrochlorothiazide (HYZAAR) 100-25 MG tablet Take 1 tablet by mouth daily. 90 tablet 1  . Multiple Vitamins-Minerals (CENTRUM ADULTS PO) Take by mouth.     No current facility-administered medications for this visit.    No Known Allergies     Objective:  BP (!) 151/85   Pulse 85   Wt 160 lb (72.6 kg)   BMI 23.29 kg/m  Gen:  alert, not ill-appearing, no distress, appropriate for age HEENT: head normocephalic without obvious abnormality, conjunctiva and cornea clear, trachea midline Pulm: Normal work of breathing, normal phonation, clear to auscultation bilaterally, no wheezes, rales or rhonchi CV: Normal rate, regular rhythm, s1 and s2 distinct, no murmurs, clicks or rubs  Neuro: alert and oriented x 3, no tremor MSK: extremities  atraumatic, normal gait and station, no peripheral edema Skin: intact, no rashes on exposed skin, no jaundice, no cyanosis Psych: appearance casual, cooperative, good eye contact, euthymic mood, affect mood-congruent, speech is articulate, and thought processes clear and goal-directed    No results found for this or any previous visit (from the past 72 hour(s)). No results found.    Assessment and Plan: 57 y.o. male with   1. Hypertension goal BP (blood pressure) < 130/80 BP Readings from Last 3 Encounters:  12/23/17 (!) 151/85  11/07/17 135/82  06/30/17 113/86  - BP out of range today, patient does not monitor BP's at home, has cut back on tobacco and alcohol products - continue Hyzaar 100-25 - he is not amenable to monitoring BP at home. Will add CCB if BP is persistently high in office - checking CMP today  2. Other tobacco product nicotine dependence, uncomplicated - reducing cigarettes and using vape for smoking cessation  3. 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 Patient reports he is not bothered by his symptoms and he does not feel excessive daytime sleepiness. Wife is concerned by the symptoms. Discussed options to include sleep study to assess for movement disorder, labs to assess for anemia, B12 deficiency, and/or active surveillance. Declines sleep study at this time. I don't think it makes sense to treat medically when symptoms are not impacting patient's quality of life or morbidity. Recommend sleep hygiene and continuing to reduce nicotine and alcohol use  4. Vasculogenic erectile dysfunction, unspecified vasculogenic erectile dysfunction type - sildenafil (VIAGRA) 100 MG tablet; Take 1 tablet (100 mg total) by mouth as needed for erectile dysfunction (for use prior to sexual activity).  Dispense: 30 tablet; Refill: 11  5. 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 Checking labs to r/o other causes.  Option to use Gabapentin at night - Comprehensive metabolic panel - Vitamin B12 - TSH + free T4  6. Low hematocrit - CBC with Differential/Platelet  7. Encounter for medication management - CMP, CBC   Patient education and anticipatory guidance given Patient agrees with treatment plan Follow-up in 6 months for medication management or sooner as needed if symptoms worsen or fail to improve  Levonne Hubert PA-C

## 2017-12-23 NOTE — Patient Instructions (Addendum)
For your blood pressure: - Goal <130/80 - baby aspirin 81 mg daily to help prevent heart attack/stroke - monitor and log blood pressures at home - check around the same time each day in a relaxed setting - Limit salt to <2000 mg/day - Follow DASH eating plan - limit alcohol to 2 standard drinks per day for men and 1 per day for women - avoid tobacco products - weight loss: 7% of current body weight - follow-up every 6 months for your blood pressure   Sleep Hygiene . Limiting daytime naps to 30 minutes . Napping does not make up for inadequate nighttime sleep. However, a short nap of 20-30 minutes can help to improve mood, alertness and performance.  . Avoiding stimulants such as  caffeine and nicotine close to bedtime.  And when it comes to alcohol, moderation is key 4. While alcohol is well-known to help you fall asleep faster, too much close to bedtime can disrupt sleep in the second half of the night as the body begins to process the alcohol.    . Exercising to promote good quality sleep.  As little as 10 minutes of aerobic exercise, such as walking or cycling, can drastically improve nighttime sleep quality.  For the best night's sleep, most people should avoid strenuous workouts close to bedtime. However, the effect of intense nighttime exercise on sleep differs from person to person, so find out what works best for you.   . Steering clear of food that can be disruptive right before sleep.   Heavy or rich foods, fatty or fried meals, spicy dishes, citrus fruits, and carbonated drinks can trigger indigestion for some people. When this occurs close to bedtime, it can lead to painful heartburn that disrupts sleep. . Ensuring adequate exposure to natural light.  This is particularly important for individuals who may not venture outside frequently. Exposure to sunlight during the day, as well as darkness at night, helps to maintain a healthy sleep-wake cycle . Marland Kitchen Establishing a regular relaxing  bedtime routine.  A regular nightly routine helps the body recognize that it is bedtime. This could include taking warm shower or bath, reading a book, or light stretches. When possible, try to avoid emotionally upsetting conversations and activities before attempting to sleep. . Making sure that the sleep environment is pleasant.  Mattress and pillows should be comfortable. The bedroom should be cool - between 60 and 67 degrees - for optimal sleep. Bright light from lamps, cell phone and TV screens can make it difficult to fall asleep4, so turn those light off or adjust them when possible. Consider using blackout curtains, eye shades, ear plugs, "white noise" machines, humidifiers, fans and other devices that can make the bedroom more relaxing. . Meditation. YouTube Kristopher Glee. There are many smartphone apps as well

## 2017-12-24 LAB — COMPREHENSIVE METABOLIC PANEL
AG RATIO: 1.9 (calc) (ref 1.0–2.5)
ALT: 13 U/L (ref 9–46)
AST: 18 U/L (ref 10–35)
Albumin: 4.3 g/dL (ref 3.6–5.1)
Alkaline phosphatase (APISO): 98 U/L (ref 40–115)
BILIRUBIN TOTAL: 1.1 mg/dL (ref 0.2–1.2)
BUN: 20 mg/dL (ref 7–25)
CALCIUM: 9.6 mg/dL (ref 8.6–10.3)
CO2: 28 mmol/L (ref 20–32)
Chloride: 103 mmol/L (ref 98–110)
Creat: 0.79 mg/dL (ref 0.70–1.33)
GLUCOSE: 87 mg/dL (ref 65–99)
Globulin: 2.3 g/dL (calc) (ref 1.9–3.7)
Potassium: 3.9 mmol/L (ref 3.5–5.3)
Sodium: 138 mmol/L (ref 135–146)
Total Protein: 6.6 g/dL (ref 6.1–8.1)

## 2017-12-24 LAB — TSH+FREE T4: TSH W/REFLEX TO FT4: 1.34 m[IU]/L (ref 0.40–4.50)

## 2017-12-24 LAB — VITAMIN B12: Vitamin B-12: 443 pg/mL (ref 200–1100)

## 2017-12-24 NOTE — Progress Notes (Signed)
Good afternoon Melvin Rich,  Your labs look great - normal B12 levels - no evidence of anemia, diabetes or thyroid disease  Numbness and tingling is likely related to orthopedic problems and/or alcohol consumption.  Best, Vinetta Bergamo

## 2017-12-25 ENCOUNTER — Encounter: Payer: Self-pay | Admitting: Physician Assistant

## 2017-12-25 DIAGNOSIS — G6289 Other specified polyneuropathies: Secondary | ICD-10-CM

## 2017-12-25 MED ORDER — GABAPENTIN 300 MG PO CAPS: 300.0000 mg | ORAL_CAPSULE | Freq: Every day | ORAL | 5 refills | Status: DC

## 2017-12-26 ENCOUNTER — Encounter: Payer: Self-pay | Admitting: Physician Assistant

## 2017-12-26 DIAGNOSIS — F17210 Nicotine dependence, cigarettes, uncomplicated: Secondary | ICD-10-CM | POA: Insufficient documentation

## 2017-12-26 DIAGNOSIS — G479 Sleep disorder, unspecified: Secondary | ICD-10-CM | POA: Insufficient documentation

## 2017-12-26 DIAGNOSIS — R202 Paresthesia of skin: Secondary | ICD-10-CM | POA: Insufficient documentation

## 2017-12-26 DIAGNOSIS — F17200 Nicotine dependence, unspecified, uncomplicated: Secondary | ICD-10-CM | POA: Insufficient documentation

## 2017-12-26 DIAGNOSIS — N529 Male erectile dysfunction, unspecified: Secondary | ICD-10-CM | POA: Insufficient documentation

## 2017-12-26 DIAGNOSIS — Z79899 Other long term (current) drug therapy: Secondary | ICD-10-CM | POA: Insufficient documentation

## 2018-01-20 ENCOUNTER — Encounter: Payer: Self-pay | Admitting: Physician Assistant

## 2018-01-20 DIAGNOSIS — L255 Unspecified contact dermatitis due to plants, except food: Secondary | ICD-10-CM

## 2018-01-20 MED ORDER — CLOBETASOL PROPIONATE 0.05 % EX CREA: 1.0000 "application " | TOPICAL_CREAM | Freq: Two times a day (BID) | CUTANEOUS | 0 refills | Status: DC

## 2018-02-23 ENCOUNTER — Encounter: Payer: Self-pay | Admitting: Physician Assistant

## 2018-02-23 ENCOUNTER — Ambulatory Visit (INDEPENDENT_AMBULATORY_CARE_PROVIDER_SITE_OTHER): Payer: 59 | Admitting: Physician Assistant

## 2018-02-23 VITALS — BP 128/88 | HR 76 | Wt 161.0 lb

## 2018-02-23 DIAGNOSIS — H6993 Unspecified Eustachian tube disorder, bilateral: Secondary | ICD-10-CM | POA: Insufficient documentation

## 2018-02-23 DIAGNOSIS — H6983 Other specified disorders of Eustachian tube, bilateral: Secondary | ICD-10-CM | POA: Diagnosis not present

## 2018-02-23 DIAGNOSIS — M5030 Other cervical disc degeneration, unspecified cervical region: Secondary | ICD-10-CM

## 2018-02-23 HISTORY — DX: Other specified disorders of eustachian tube, bilateral: H69.83

## 2018-02-23 HISTORY — DX: Unspecified eustachian tube disorder, bilateral: H69.93

## 2018-02-23 MED ORDER — MELOXICAM 15 MG PO TABS: 15.0000 mg | ORAL_TABLET | Freq: Every day | ORAL | 0 refills | Status: DC

## 2018-02-23 MED ORDER — FLUTICASONE PROPIONATE 50 MCG/ACT NA SUSP: 1.0000 | Freq: Every day | NASAL | 3 refills | Status: DC

## 2018-02-23 MED ORDER — PREDNISONE 50 MG PO TABS: ORAL_TABLET | ORAL | 0 refills | Status: DC

## 2018-02-23 MED ORDER — METHOCARBAMOL 500 MG PO TABS
500.0000 mg | ORAL_TABLET | Freq: Three times a day (TID) | ORAL | 0 refills | Status: DC
Start: 2018-02-23 — End: 2018-07-27

## 2018-02-23 NOTE — Progress Notes (Signed)
HPI:                                                                Melvin Rich is a 57 y.o. male who presents to [facility]: Primary Care Sports Medicine today for neck pain  Neck Pain   This is a new problem. The current episode started in the past 7 days (x 3 days). The problem occurs constantly. The pain is present in the right side. Quality: sharp, "twinge" The pain is moderate. The symptoms are aggravated by twisting and position. The pain is same all the time. Associated symptoms include numbness (bilateral hands, chronic) and tingling. Pertinent negatives include no fever or headaches. He has tried muscle relaxants for the symptoms. The treatment provided mild relief.   Also complains of bilateral ear fullness. Reports wife thinks he has a hearing problem because he listens to the television on a loud volume. Denies ear pain, otorrhea, or tinnitus. He does endorse nasal congestion, post-nasal drip and morning cough with sputum and throat clearing.  Past Medical History:  Diagnosis Date  . Alcohol use disorder   . Chronic active hepatitis (HCC) 04/23/2017  . Chronic hepatitis C virus infection (HCC) 04/23/2017  . Degenerative disc disease, cervical   . Degenerative disc disease, cervical   . History of drug use disorder    Heroin, IVDA  . Hypertension   . Rheumatoid arthritis (HCC)   . Substance abuse (HCC)   . Transaminitis 04/23/2017   Past Surgical History:  Procedure Laterality Date  . HIP SURGERY  2005  . SHOULDER SURGERY  1985   Social History   Tobacco Use  . Smoking status: Current Every Day Smoker    Packs/day: 0.50    Years: 45.00    Pack years: 22.50    Types: Cigarettes  . Smokeless tobacco: Never Used  . Tobacco comment: 1/2 pack for 45 years, down to 3/day 12/23/17  Substance Use Topics  . Alcohol use: Yes    Alcohol/week: 2.4 oz    Types: 4 Cans of beer per week    Comment: 20 beers per week   He was adopted. Family  history is unknown by patient.    ROS: negative except as noted in the HPI  Medications: Current Outpatient Medications  Medication Sig Dispense Refill  . aspirin 81 MG tablet Take 1 tablet (81 mg total) by mouth daily. 30 tablet 11  . clobetasol cream (TEMOVATE) 0.05 % Apply 1 application topically 2 (two) times daily. Refrigerate. Apply to affected areas for 1-2 weeks. Avoid contact with face/genitals 45 g 0  . diclofenac sodium (VOLTAREN) 1 % GEL Apply 4 g topically 4 (four) times daily. To affected joint. 100 g 11  . gabapentin (NEURONTIN) 300 MG capsule Take 1 capsule (300 mg total) by mouth at bedtime. 30 capsule 5  . losartan-hydrochlorothiazide (HYZAAR) 100-25 MG tablet Take 1 tablet by mouth daily. 90 tablet 1  . Multiple Vitamins-Minerals (CENTRUM ADULTS PO) Take by mouth.    . sildenafil (VIAGRA) 100 MG tablet Take 1 tablet (100 mg total) by mouth as needed for erectile dysfunction (for use prior to sexual activity). 30 tablet 11   No current facility-administered medications for this visit.    No Known Allergies  Objective:  BP 128/88   Pulse 76   Wt 161 lb (73 kg)   BMI 23.43 kg/m  Gen:  alert, not ill-appearing, no distress, appropriate for age HEENT: head normocephalic without obvious abnormality, conjunctiva and cornea clear, TM's pearly gray and air-fluid level, no mastoid tenderness, hearing grossly intact, neck supple, no cervical adenopathy, trachea midline Pulm: Normal work of breathing, normal phonation Neuro: alert and oriented x 3, DTR's intact in upper extremities bilaterally MSK: extremities atraumatic, normal gait and station Neck: no midline tenderness, right paraspinal muscle spasm, ROM significantly diminished with external rotation and lateral bend bilaterally Skin: intact, no rashes on exposed skin, no jaundice, no cyanosis Psych: well-groomed, cooperative, good eye contact, euthymic mood, affect mood-congruent, speech is articulate, and thought  processes clear and goal-directed  MRI Cervical Spine Wo Contrast4/04/2012 [facility] Result Narrative  CLINICAL INDICATION:NECK PAIN COMMENTS:   [facility] 0017 - MRI SPINE CERV WO CONT- Apr8 2013     ADDITIONAL INDICATION: Neck pain. . COMPARISON: CT cervical spine 11/28/2010. . TECHNIQUE: Multiplanar, multisequence imaging of cervical spine obtained  without contrast. . FINDINGS:  . Cervicomedullary junction: Normal. No cerebellar tonsillar ectopia. Marland Kitchen Spinal cord:No intrinsic spinal cord abnormality.. . Vertebrobasilar system:Flow is demonstrated within the  vertebrobasilar system.  . . Osseous structures: Vertebral body heights maintained.Degenerative  size and endplate changes most pronounced C5-C6 and C6-C7. Prominent  multilevel facet arthropathy. There is associated marrow edema involving  the right-sided facet joints at C4-C5 and the C7-T1. There is also  prominent degenerative changes involving the articulation of the  articulation of C1 and the dens with some marrow edema present within the  dens. No fracture or destructive bone lesion. . Alignment: Reversal of the normal cervical lordosis. No significant  subluxation. . . C2-C3: Preservation of disc height. No focal disc herniation or  significant spinal canal stenosis. Bilateral facet hypertrophy. No  significant foraminal stenosis. . C3-C4: Mild loss of disc height. Small posterior disc bulge mild to  moderately narrows the spinal canal and mildly flattens the spinal cord.  Bilateral uncovertebral spurring and facet hypertrophy. Severe bilateral  foraminal stenosis. Marland Kitchen C4-C5: Relative preservation of disc height. Tiny central disc  extrusion with some superior migration. Mild narrowing of the spinal  canal. No cord impingement. Bilateral uncovertebral spurring and facet  hypertrophy. Severe bilateral foraminal stenosis. Marland Kitchen C5-C6: Moderate loss of disc height. Mild anterior hypertrophic  changes.  Small posterior disc bulge mild moderately narrows the spinal  canal. Minimal flattening of the spinal cord. Bilateral uncovertebral  spurring and facet hypertrophy. Severe bilateral foraminal stenosis.. . C6-C7: Severe loss of disc height. Mild anterior hypertrophic changes.  Broad-based posterior disc osteophyte complex moderately narrows the  spinal canal and mildly flattens the spinal cord. Bilateral uncovertebral  spurring and facet hypertrophy. Severe bilateral foraminal stenosis.Marland Kitchen Marland Kitchen C7-T1: No focal disc herniation or significant spinal canal stenosis or  an facet hypertrophy. No significant foraminal stenosis.. . . Paraspinal tissues unremarkable. Marland Kitchen   IMPRESSION: 1. Reversal of the normal cervical or doses with prominent superimposed  DDD and facet arthropathy. Mild moderate spinal canal stenosis and mild  cord impingement C3-C4 and C5-C6. Moderate spinal canal stenosis and mild  cord impingement C6-C7.Marland Kitchen Severe bilateral foraminal stenosis C3-C4  through C6-C7.  2. There is hypertrophic facet arthropathy with associated marrow edema  involving the right-sided facet joints at C4-C5 and C7-T1. Appearance  could represent facet synovitis and this could be a source of right-sided  neck pain. Please correlate clinically. 3.  No intrinsic spinal cord abnormality.   READING DATE: (12/08/2011 10:32AM) SPECIAL CASE: ( ) TEACHING CASE: ( ) TRANSCRIBED BY: (PSCB) RELEASE RESULTS: (Y) SPECIAL INTEREST CODE(S): (\) ___________________________________________________________ Current Report Read [id number] on Apr8 2013 10:32A Transcribed byJenetta Downer on Apr8 2013 10:32A Electronically Signed by:DR. WILLIAM PATTON [id number] 2013 10:32A     No results found for this or any previous visit (from the past 72 hour(s)). No results found.    Assessment and Plan: 57 y.o. male with   DDD (degenerative disc disease), cervical - Plan: predniSONE (DELTASONE) 50 MG  tablet, methocarbamol (ROBAXIN) 500 MG tablet, meloxicam (MOBIC) 15 MG tablet, Ambulatory referral to Physical Therapy  Eustachian tube dysfunction, bilateral - Plan: fluticasone (FLONASE) 50 MCG/ACT nasal spray  Acute neck pain in the setting of DDD - personally reviewed MRI report from 2013 showing facet arthropathy and mild stenosis - discussed mainstay of treatment is formal PT. Referral placed. I also reviewed home rehab exercises - steroid burst x 5 days, followed by Meloxicam daily prn - continue TENS unit, heating pad    Patient education and anticipatory guidance given Patient agrees with treatment plan Follow-up in 6 weeks with Sports Medicine or sooner as needed if symptoms worsen or fail to improve  Levonne Hubert PA-C

## 2018-02-23 NOTE — Progress Notes (Signed)
ne

## 2018-02-23 NOTE — Patient Instructions (Signed)
Neck Exercises Neck exercises can be important for many reasons:  They can help you to improve and maintain flexibility in your neck. This can be especially important as you age.  They can help to make your neck stronger. This can make movement easier.  They can reduce or prevent neck pain.  They may help your upper back.  Ask your health care provider which neck exercises would be best for you. Exercises Neck Press Repeat this exercise 10 times. Do it first thing in the morning and right before bed or as told by your health care provider. 1. Lie on your back on a firm bed or on the floor with a pillow under your head. 2. Use your neck muscles to push your head down on the pillow and straighten your spine. 3. Hold the position as well as you can. Keep your head facing up and your chin tucked. 4. Slowly count to 5 while holding this position. 5. Relax for a few seconds. Then repeat.  Isometric Strengthening Do a full set of these exercises 2 times a day or as told by your health care provider. 1. Sit in a supportive chair and place your hand on your forehead. 2. Push forward with your head and neck while pushing back with your hand. Hold for 10 seconds. 3. Relax. Then repeat the exercise 3 times. 4. Next, do thesequence again, this time putting your hand against the back of your head. Use your head and neck to push backward against the hand pressure. 5. Finally, do the same exercise on either side of your head, pushing sideways against the pressure of your hand.  Prone Head Lifts Repeat this exercise 5 times. Do this 2 times a day or as told by your health care provider. 1. Lie face-down, resting on your elbows so that your chest and upper back are raised. 2. Start with your head facing downward, near your chest. Position your chin either on or near your chest. 3. Slowly lift your head upward. Lift until you are looking straight ahead. Then continue lifting your head as far back as  you can stretch. 4. Hold your head up for 5 seconds. Then slowly lower it to your starting position.  Supine Head Lifts Repeat this exercise 8-10 times. Do this 2 times a day or as told by your health care provider. 1. Lie on your back, bending your knees to point to the ceiling and keeping your feet flat on the floor. 2. Lift your head slowly off the floor, raising your chin toward your chest. 3. Hold for 5 seconds. 4. Relax and repeat.  Scapular Retraction Repeat this exercise 5 times. Do this 2 times a day or as told by your health care provider. 1. Stand with your arms at your sides. Look straight ahead. 2. Slowly pull both shoulders backward and downward until you feel a stretch between your shoulder blades in your upper back. 3. Hold for 10-30 seconds. 4. Relax and repeat.  Contact a health care provider if:  Your neck pain or discomfort gets much worse when you do an exercise.  Your neck pain or discomfort does not improve within 2 hours after you exercise. If you have any of these problems, stop exercising right away. Do not do the exercises again unless your health care provider says that you can. Get help right away if:  You develop sudden, severe neck pain. If this happens, stop exercising right away. Do not do the exercises again unless your   health care provider says that you can. Exercises Neck Stretch  Repeat this exercise 3-5 times. 1. Do this exercise while standing or while sitting in a chair. 2. Place your feet flat on the floor, shoulder-width apart. 3. Slowly turn your head to the right. Turn it all the way to the right so you can look over your right shoulder. Do not tilt or tip your head. 4. Hold this position for 10-30 seconds. 5. Slowly turn your head to the left, to look over your left shoulder. 6. Hold this position for 10-30 seconds.  Neck Retraction Repeat this exercise 8-10 times. Do this 3-4 times a day or as told by your health care  provider. 1. Do this exercise while standing or while sitting in a sturdy chair. 2. Look straight ahead. Do not bend your neck. 3. Use your fingers to push your chin backward. Do not bend your neck for this movement. Continue to face straight ahead. If you are doing the exercise properly, you will feel a slight sensation in your throat and a stretch at the back of your neck. 4. Hold the stretch for 1-2 seconds. Relax and repeat.  This information is not intended to replace advice given to you by your health care provider. Make sure you discuss any questions you have with your health care provider. Document Released: 07/30/2015 Document Revised: 01/24/2016 Document Reviewed: 02/26/2015 Elsevier Interactive Patient Education  2018 [employer]  

## 2018-02-24 ENCOUNTER — Encounter: Payer: Self-pay | Admitting: Physician Assistant

## 2018-03-11 ENCOUNTER — Ambulatory Visit: Payer: 59 | Admitting: Physical Therapy

## 2018-05-06 ENCOUNTER — Other Ambulatory Visit: Payer: Self-pay | Admitting: Physician Assistant

## 2018-05-10 ENCOUNTER — Other Ambulatory Visit: Payer: Self-pay

## 2018-05-10 DIAGNOSIS — G6289 Other specified polyneuropathies: Secondary | ICD-10-CM

## 2018-05-10 MED ORDER — GABAPENTIN 300 MG PO CAPS: 300.0000 mg | ORAL_CAPSULE | Freq: Every day | ORAL | 0 refills | Status: DC

## 2018-05-13 ENCOUNTER — Encounter: Payer: Self-pay | Admitting: Physician Assistant

## 2018-05-13 NOTE — Telephone Encounter (Signed)
Left message for patient to return call.

## 2018-06-22 ENCOUNTER — Encounter: Payer: Self-pay | Admitting: Physician Assistant

## 2018-07-20 ENCOUNTER — Ambulatory Visit (INDEPENDENT_AMBULATORY_CARE_PROVIDER_SITE_OTHER): Payer: 59 | Admitting: Physician Assistant

## 2018-07-20 ENCOUNTER — Encounter: Payer: Self-pay | Admitting: Physician Assistant

## 2018-07-20 VITALS — BP 127/88 | HR 89 | Resp 16 | Wt 151.0 lb

## 2018-07-20 DIAGNOSIS — I10 Essential (primary) hypertension: Secondary | ICD-10-CM

## 2018-07-20 DIAGNOSIS — H9192 Unspecified hearing loss, left ear: Secondary | ICD-10-CM | POA: Insufficient documentation

## 2018-07-20 DIAGNOSIS — Z13220 Encounter for screening for lipoid disorders: Secondary | ICD-10-CM

## 2018-07-20 DIAGNOSIS — F17210 Nicotine dependence, cigarettes, uncomplicated: Secondary | ICD-10-CM

## 2018-07-20 DIAGNOSIS — Z131 Encounter for screening for diabetes mellitus: Secondary | ICD-10-CM

## 2018-07-20 DIAGNOSIS — H6983 Other specified disorders of Eustachian tube, bilateral: Secondary | ICD-10-CM

## 2018-07-20 DIAGNOSIS — Z122 Encounter for screening for malignant neoplasm of respiratory organs: Secondary | ICD-10-CM

## 2018-07-20 DIAGNOSIS — Z0000 Encounter for general adult medical examination without abnormal findings: Secondary | ICD-10-CM | POA: Diagnosis not present

## 2018-07-20 DIAGNOSIS — M5030 Other cervical disc degeneration, unspecified cervical region: Secondary | ICD-10-CM

## 2018-07-20 DIAGNOSIS — G6289 Other specified polyneuropathies: Secondary | ICD-10-CM

## 2018-07-20 DIAGNOSIS — Z125 Encounter for screening for malignant neoplasm of prostate: Secondary | ICD-10-CM

## 2018-07-20 DIAGNOSIS — N529 Male erectile dysfunction, unspecified: Secondary | ICD-10-CM

## 2018-07-20 DIAGNOSIS — Z130 Encounter for screening for diseases of the blood and blood-forming organs and certain disorders involving the immune mechanism: Secondary | ICD-10-CM | POA: Diagnosis not present

## 2018-07-20 HISTORY — DX: Unspecified hearing loss, left ear: H91.92

## 2018-07-20 MED ORDER — SILDENAFIL CITRATE 100 MG PO TABS: 100.0000 mg | ORAL_TABLET | ORAL | 11 refills | Status: DC | PRN

## 2018-07-20 MED ORDER — GABAPENTIN 300 MG PO CAPS: 300.0000 mg | ORAL_CAPSULE | Freq: Every day | ORAL | 1 refills | Status: DC

## 2018-07-20 MED ORDER — MELOXICAM 15 MG PO TABS: 15.0000 mg | ORAL_TABLET | Freq: Every day | ORAL | 1 refills | Status: DC

## 2018-07-20 MED ORDER — VALSARTAN 160 MG PO TABS: 160.0000 mg | ORAL_TABLET | Freq: Every day | ORAL | 1 refills | Status: DC

## 2018-07-20 NOTE — Progress Notes (Signed)
HPI:                                                                Melvin Rich is a 57 y.o. male who presents to [facility]: Primary Care Sports Medicine today for annual physical exam  Current concerns: requesting medication refills  HTN: Currently prescribed losartan-hydrochlorothiazide 100-25 mg daily.  Admits that he is only taking this approximately every other day because he feels like it fatigues him.  Reports multiple blood pressure medicines have given him this lethargic feeling over the years.  He is open to trying something different.  Does not monitor his blood pressure at home.  Does not currently exercise.  Currently smoking about half pack per day.  He did quit vaping but unfortunately has increased his cigarette consumption.  He has smoked anywhere from 1/2 to 1 pack/day over the last 47 years.  Denies vision change, headache, chest pain with exertion, orthopnea, lightheadedness, syncope and edema. Risk factors include: Tobacco use, male sex, age greater than 16  Continues to endorse muffled hearing in his left ear.  Depression screen [facility] 2/9 07/20/2018 07/20/2018 12/23/2017 05/07/2017 05/01/2017  Decreased Interest 0 0 0 0 0  Down, Depressed, Hopeless 0 0 0 0 0  PHQ - 2 Score 0 0 0 0 0  Altered sleeping - - 1 - -  Tired, decreased energy - - 0 - -  Change in appetite - - 0 - -  Feeling bad or failure about yourself  - - 0 - -  Trouble concentrating - - 0 - -  Moving slowly or fidgety/restless - - 0 - -  Suicidal thoughts - - 0 - -  PHQ-9 Score - - 1 - -     Past Medical History:  Diagnosis Date  . Alcohol use disorder   . Chronic active hepatitis (HCC) 04/23/2017  . Chronic hepatitis C virus infection (HCC) 04/23/2017  . Degenerative disc disease, cervical   . Degenerative disc disease, cervical   . History of drug use disorder    Heroin, IVDA  . Hypertension   . Rheumatoid arthritis (HCC)   . Substance abuse (HCC)   . Transaminitis  04/23/2017   Past Surgical History:  Procedure Laterality Date  . HIP SURGERY  2005  . SHOULDER SURGERY  1985   Social History   Tobacco Use  . Smoking status: Current Every Day Smoker    Packs/day: 0.50    Years: 45.00    Pack years: 22.50    Types: Cigarettes  . Smokeless tobacco: Never Used  . Tobacco comment: 1/2 pack for 45 years, down to 3/day 12/23/17  Substance Use Topics  . Alcohol use: Yes    Alcohol/week: 4.0 standard drinks    Types: 4 Cans of beer per week    Comment: 20 beers per week   He was adopted. Family history is unknown by patient.    ROS: negative except as noted in the HPI  Medications: Current Outpatient Medications  Medication Sig Dispense Refill  . aspirin 81 MG tablet Take 1 tablet (81 mg total) by mouth daily. 30 tablet 11  . fluticasone (FLONASE) 50 MCG/ACT nasal spray Place 1 spray into both nostrils daily. 16 g 3  . gabapentin (NEURONTIN)  300 MG capsule Take 1 capsule (300 mg total) by mouth at bedtime. 90 capsule 1  . meloxicam (MOBIC) 15 MG tablet Take 1 tablet (15 mg total) by mouth daily. 90 tablet 1  . methocarbamol (ROBAXIN) 500 MG tablet Take 1 tablet (500 mg total) by mouth 3 (three) times daily. 90 tablet 0  . Multiple Vitamins-Minerals (CENTRUM ADULTS PO) Take by mouth.    . sildenafil (VIAGRA) 100 MG tablet Take 1 tablet (100 mg total) by mouth as needed for erectile dysfunction (for use prior to sexual activity). 30 tablet 11  . valsartan (DIOVAN) 160 MG tablet Take 1 tablet (160 mg total) by mouth daily. 90 tablet 1   No current facility-administered medications for this visit.    No Known Allergies     Objective:  BP 127/88   Pulse 89   Resp 16   Wt 151 lb (68.5 kg)   SpO2 98%   BMI 21.98 kg/m  General Appearance:  Alert, cooperative, no distress, appropriate for age                            Head:  Normocephalic, without obvious abnormality                             Eyes:  PERRL, EOM's intact, conjunctiva and  cornea clear                             Ears:  TM pearly gray color with air-fluid level, external ear canals normal, both ears                            Nose:  Nares symmetrical, mucosa pink                          Throat:  Lips, tongue, and mucosa are moist, pink, and intact; oropharynx clear, uvula midline; poor dentition                             Neck:  Supple; symmetrical, trachea midline, no adenopathy; thyroid: no enlargement, symmetric, no tenderness/mass/nodules                             Back:  Symmetrical, no curvature, ROM normal                                        Lungs:  Clear to auscultation bilaterally, respirations unlabored                             Heart:  normal rate & regular rhythm, S1 and S2 normal, no murmurs, rubs, or gallops                     Abdomen:  Soft, non-tender, no mass or organomegaly              Genitourinary:  deferred         Musculoskeletal:  Tone and strength strong and symmetrical, all extremities; , normal gait and station, no  peripheral edema                     Lymphatic:  No adenopathy             Skin/Hair/Nails:  Skin warm, dry and intact, no rashes or abnormal dyspigmentation on limited exam                   Neurologic:  Alert and oriented x3, no cranial nerve deficits, sensation grossly intact, normal gait and station, no tremor Psych: well-groomed, cooperative, good eye contact, euthymic mood, affect mood-congruent, speech is articulate, and thought processes clear and goal-directed   Lab Results  Component Value Date   CREATININE 0.79 12/23/2017   BUN 20 12/23/2017   NA 138 12/23/2017   K 3.9 12/23/2017   CL 103 12/23/2017   CO2 28 12/23/2017   Lab Results  Component Value Date   ALT 13 12/23/2017   AST 18 12/23/2017   ALKPHOS 102 04/20/2017   BILITOT 1.1 12/23/2017   Lab Results  Component Value Date   CHOL 190 04/20/2017   HDL 114 04/20/2017   TRIG 72 04/20/2017   CHOLHDL 1.7 04/20/2017     No results found  for this or any previous visit (from the past 72 hour(s)). No results found.    Assessment and Plan: 57 y.o. male with   .Kaylyn was seen today for annual exam.  Diagnoses and all orders for this visit:  Encounter for annual physical exam -     CBC -     COMPLETE METABOLIC PANEL WITH GFR -     Lipid Panel w/reflex Direct LDL -     PSA  Screening for lipid disorders -     Lipid Panel w/reflex Direct LDL  Screening for diabetes mellitus -     COMPLETE METABOLIC PANEL WITH GFR  Screening for blood disease -     CBC -     COMPLETE METABOLIC PANEL WITH GFR  Screening PSA (prostate specific antigen) -     PSA  Cigarette nicotine dependence without complication  Vasculogenic erectile dysfunction, unspecified vasculogenic erectile dysfunction type -     sildenafil (VIAGRA) 100 MG tablet; Take 1 tablet (100 mg total) by mouth as needed for erectile dysfunction (for use prior to sexual activity).  Other polyneuropathy -     gabapentin (NEURONTIN) 300 MG capsule; Take 1 capsule (300 mg total) by mouth at bedtime.  DDD (degenerative disc disease), cervical -     meloxicam (MOBIC) 15 MG tablet; Take 1 tablet (15 mg total) by mouth daily.  Encounter for screening for malignant neoplasm of respiratory organs -     CT CHEST LUNG CA SCREEN LOW DOSE W/O CM; Future  Eustachian tube dysfunction, bilateral -     Ambulatory referral to ENT  Hypertension goal BP (blood pressure) < 130/80 -     valsartan (DIOVAN) 160 MG tablet; Take 1 tablet (160 mg total) by mouth daily.  Hearing loss of left ear, unspecified hearing loss type     - Personally reviewed PMH, PSH, PFH, medications, allergies, HM - Age-appropriate cancer screening: Cologuard UTD, declined DRE but agreed to PSA, eligible for lung cancer screening - Declines Tdap and influenza vaccines - PHQ2 negative - Fasting labs pending.  Patient was not fasting today, plans to return one morning this week  - Hypertension: BP in  range today.  Unfortunately having some intolerance with his current ARB-thiazide, which is affecting medication compliance.  Switching to valsartan 180 mg QD. continue baby aspirin for primary prevention.  - Hearing loss: Continues to endorse muffled hearing in the left ear for greater than 6 months.  He has been treated for eustachian tube dysfunction with an intranasal corticosteroid without improvement in symptoms.  Referring to ENT   Patient education and anticipatory guidance given Patient agrees with treatment plan Follow-up in 6 months for medication management or sooner as needed if symptoms worsen or fail to improve  Levonne Hubert PA-C

## 2018-07-20 NOTE — Patient Instructions (Addendum)
I have also ordered fasting labs. The lab is a walk-in open M-F 7:30a-4:30p (closed 12:30-1:30p). Nothing to eat or drink after midnight or at least 8 hours before your blood draw. You can have water and your medications.    For your blood pressure: - Goal <130/80 (Ideally 120's/70's) - take your blood pressure medication in the morning (unless instructed differently) - take baby aspirin 81 mg daily to help prevent heart attack/stroke - monitor and log blood pressures at home - check around the same time each day in a relaxed setting - Limit salt to <2500 mg/day - Follow DASH (Dietary Approach to Stopping Hypertension) eating plan - Try to get at least 150 minutes of aerobic exercise per week - Aim to go on a brisk walk 30 minutes per day at least 5 days per week. If you're not active, gradually increase how long you walk by 5 minutes each week - limit alcohol: 2 standard drinks per day for men and 1 per day for women - avoid tobacco/nicotine products. Consider smoking cessation if you smoke - weight loss: 7% of current body weight can reduce your blood pressure by 5-10 points - follow-up at least every 6 months for your blood pressure. Follow-up sooner if your BP is not controlled   Preventive Care 40-64 Years, Male Preventive care refers to lifestyle choices and visits with your health care provider that can promote health and wellness. What does preventive care include?  A yearly physical exam. This is also called an annual well check.  Dental exams once or twice a year.  Routine eye exams. Ask your health care provider how often you should have your eyes checked.  Personal lifestyle choices, including: ? Daily care of your teeth and gums. ? Regular physical activity. ? Eating a healthy diet. ? Avoiding tobacco and drug use. ? Limiting alcohol use. ? Practicing safe sex. ? Taking low-dose aspirin every day starting at age 107. What happens during an annual well check? The  services and screenings done by your health care provider during your annual well check will depend on your age, overall health, lifestyle risk factors, and family history of disease. Counseling Your health care provider may ask you questions about your:  Alcohol use.  Tobacco use.  Drug use.  Emotional well-being.  Home and relationship well-being.  Sexual activity.  Eating habits.  Work and work Statistician.  Screening You may have the following tests or measurements:  Height, weight, and BMI.  Blood pressure.  Lipid and cholesterol levels. These may be checked every 5 years, or more frequently if you are over 42 years old.  Skin check.  Lung cancer screening. You may have this screening every year starting at age 56 if you have a 30-pack-year history of smoking and currently smoke or have quit within the past 15 years.  Fecal occult blood test (FOBT) of the stool. You may have this test every year starting at age 72.  Flexible sigmoidoscopy or colonoscopy. You may have a sigmoidoscopy every 5 years or a colonoscopy every 10 years starting at age 38.  Prostate cancer screening. Recommendations will vary depending on your family history and other risks.  Hepatitis C blood test.  Hepatitis B blood test.  Sexually transmitted disease (STD) testing.  Diabetes screening. This is done by checking your blood sugar (glucose) after you have not eaten for a while (fasting). You may have this done every 1-3 years.  Discuss your test results, treatment options, and if necessary,  the need for more tests with your health care provider. Vaccines Your health care provider may recommend certain vaccines, such as:  Influenza vaccine. This is recommended every year.  Tetanus, diphtheria, and acellular pertussis (Tdap, Td) vaccine. You may need a Td booster every 10 years.  Varicella vaccine. You may need this if you have not been vaccinated.  Zoster vaccine. You may need this  after age 6.  Measles, mumps, and rubella (MMR) vaccine. You may need at least one dose of MMR if you were born in 1957 or later. You may also need a second dose.  Pneumococcal 13-valent conjugate (PCV13) vaccine. You may need this if you have certain conditions and have not been vaccinated.  Pneumococcal polysaccharide (PPSV23) vaccine. You may need one or two doses if you smoke cigarettes or if you have certain conditions.  Meningococcal vaccine. You may need this if you have certain conditions.  Hepatitis A vaccine. You may need this if you have certain conditions or if you travel or work in places where you may be exposed to hepatitis A.  Hepatitis B vaccine. You may need this if you have certain conditions or if you travel or work in places where you may be exposed to hepatitis B.  Haemophilus influenzae type b (Hib) vaccine. You may need this if you have certain risk factors.  Talk to your health care provider about which screenings and vaccines you need and how often you need them. This information is not intended to replace advice given to you by your health care provider. Make sure you discuss any questions you have with your health care provider. Document Released: 09/14/2015 Document Revised: 05/07/2016 Document Reviewed: 06/19/2015 Elsevier Interactive Patient Education  [employer].

## 2018-07-22 ENCOUNTER — Ambulatory Visit: Payer: 59

## 2018-07-22 DIAGNOSIS — Z122 Encounter for screening for malignant neoplasm of respiratory organs: Secondary | ICD-10-CM

## 2018-07-23 LAB — LIPID PANEL W/REFLEX DIRECT LDL
CHOL/HDL RATIO: 2.5 (calc) (ref <5.0)
CHOLESTEROL: 153 mg/dL (ref <200)
HDL: 62 mg/dL (ref >40)
LDL Cholesterol (Calc): 77 mg/dL (calc)
Non-HDL Cholesterol (Calc): 91 mg/dL (calc) (ref <130)
Triglycerides: 68 mg/dL (ref <150)

## 2018-07-23 LAB — CBC
HEMATOCRIT: 40.4 % (ref 38.5–50.0)
HEMOGLOBIN: 14 g/dL (ref 13.2–17.1)
MCH: 29.9 pg (ref 27.0–33.0)
MCHC: 34.7 g/dL (ref 32.0–36.0)
MCV: 86.1 fL (ref 80.0–100.0)
MPV: 11.1 fL (ref 7.5–12.5)
Platelets: 223 10*3/uL (ref 140–400)
RBC: 4.69 10*6/uL (ref 4.20–5.80)
RDW: 11.8 % (ref 11.0–15.0)
WBC: 4.5 10*3/uL (ref 3.8–10.8)

## 2018-07-23 LAB — COMPLETE METABOLIC PANEL WITH GFR
AG Ratio: 1.9 (calc) (ref 1.0–2.5)
ALBUMIN MSPROF: 4.2 g/dL (ref 3.6–5.1)
ALKALINE PHOSPHATASE (APISO): 114 U/L (ref 40–115)
ALT: 17 U/L (ref 9–46)
AST: 22 U/L (ref 10–35)
BILIRUBIN TOTAL: 0.6 mg/dL (ref 0.2–1.2)
BUN: 17 mg/dL (ref 7–25)
CO2: 27 mmol/L (ref 20–32)
CREATININE: 0.7 mg/dL (ref 0.70–1.33)
Calcium: 9.1 mg/dL (ref 8.6–10.3)
Chloride: 104 mmol/L (ref 98–110)
GFR, EST AFRICAN AMERICAN: 121 mL/min/{1.73_m2} (ref >=60)
GFR, Est Non African American: 105 mL/min/{1.73_m2} (ref >=60)
GLOBULIN: 2.2 g/dL (ref 1.9–3.7)
Glucose, Bld: 83 mg/dL (ref 65–99)
Potassium: 4.3 mmol/L (ref 3.5–5.3)
SODIUM: 135 mmol/L (ref 135–146)
TOTAL PROTEIN: 6.4 g/dL (ref 6.1–8.1)

## 2018-07-23 LAB — PSA: PSA: 2.3 ng/mL (ref <=4.0)

## 2018-07-27 ENCOUNTER — Ambulatory Visit: Payer: 59 | Admitting: Physician Assistant

## 2018-07-27 ENCOUNTER — Encounter: Payer: Self-pay | Admitting: Physician Assistant

## 2018-07-27 ENCOUNTER — Emergency Department
Admission: EM | Admit: 2018-07-27 | Discharge: 2018-07-27 | Disposition: A | Discharge status: Home / Self Care | Payer: 59 | Source: Home / Self Care

## 2018-07-27 VITALS — BP 133/93 | HR 69 | Temp 97.9°F | Wt 157.0 lb

## 2018-07-27 DIAGNOSIS — H8111 Benign paroxysmal vertigo, right ear: Secondary | ICD-10-CM | POA: Insufficient documentation

## 2018-07-27 HISTORY — DX: Benign paroxysmal vertigo, right ear: H81.11

## 2018-07-27 NOTE — Progress Notes (Signed)
HPI:                                                                Melvin Rich is a 57 y.o. male who presents to [facility]: [facility] today for dizziness   New onset dizziness this morning with getting out of bed. Reports he nearly fell. Described as room spinning for seconds, less than a minute. Had to leave work today due to his symptoms  precipitating factors: position change worse w/head movements: no Worse with  history of vertigo: yes, several years ago hearing change: no change, chronic tinnitus and hearing loss gait disturbance: feels off balance briefly lasting seconds to 1 minute prodromal sx: no Syncope: no pertinent negatives:  staggering or ataxic gait, vomiting, headache, double vision, visual loss, slurred speech, numbness of the face or body, weakness, clumsiness, or incoordination     Past Medical History:  Diagnosis Date  . Alcohol use disorder   . Chronic active hepatitis (HCC) 04/23/2017  . Chronic hepatitis C virus infection (HCC) 04/23/2017  . Degenerative disc disease, cervical   . Degenerative disc disease, cervical   . History of drug use disorder    Heroin, IVDA  . Hypertension   . Rheumatoid arthritis (HCC)   . Substance abuse (HCC)   . Transaminitis 04/23/2017   Past Surgical History:  Procedure Laterality Date  . HIP SURGERY  2005  . SHOULDER SURGERY  1985   Social History   Tobacco Use  . Smoking status: Current Every Day Smoker    Packs/day: 0.50    Years: 47.00    Pack years: 23.50    Types: Cigarettes  . Smokeless tobacco: Never Used  Substance Use Topics  . Alcohol use: Yes    Alcohol/week: 4.0 standard drinks    Types: 4 Cans of beer per week    Comment: 20 beers per week   He was adopted. Family history is unknown by patient.    ROS: negative except as noted in the HPI  Medications: Current Outpatient Medications  Medication Sig Dispense Refill  . aspirin 81 MG tablet  Take 1 tablet (81 mg total) by mouth daily. 30 tablet 11  . fluticasone (FLONASE) 50 MCG/ACT nasal spray Place 1 spray into both nostrils daily. 16 g 3  . gabapentin (NEURONTIN) 300 MG capsule Take 1 capsule (300 mg total) by mouth at bedtime. 90 capsule 1  . meloxicam (MOBIC) 15 MG tablet Take 1 tablet (15 mg total) by mouth daily. 90 tablet 1  . methocarbamol (ROBAXIN) 500 MG tablet Take 1 tablet (500 mg total) by mouth 3 (three) times daily. 90 tablet 0  . Multiple Vitamins-Minerals (CENTRUM ADULTS PO) Take by mouth.    . sildenafil (VIAGRA) 100 MG tablet Take 1 tablet (100 mg total) by mouth as needed for erectile dysfunction (for use prior to sexual activity). 30 tablet 11  . valsartan (DIOVAN) 160 MG tablet Take 1 tablet (160 mg total) by mouth daily. 90 tablet 1   No current facility-administered medications for this visit.    No Known Allergies     Objective:  BP (!) 133/93   Pulse 69   Temp 97.9 F (36.6 C) (Oral)   Wt 157 lb (71.2 kg)  BMI 22.85 kg/m  Gen:  not ill-appearing, no acute distress HEENT: head normocephalic, atraumatic; conjunctiva and cornea clear, oropharynx clear, moist mucus membranes; neck supple, no meningeal signs Pulm: Normal work of breathing, normal phonation, clear to auscultation bilaterally CV: Normal rate, regular rhythm, s1 and s2 distinct, no murmurs, clicks or rubs Neuro:  cranial nerves II-XII intact, no nystagmus, normal finger-to-nose, normal heel-to-shin, negative pronator drift, normal rapid alternating movements, normal tone, no tremor; [employer] reproduces symptoms on the right side, no nystagmus MSK: strength 5/5 and symmetric in bilateral upper and lower extremities, normal gait and station Mental Status: alert and oriented x 3, speech articulate, and thought processes clear and goal-directed   Orthostatic VS for the past 24 hrs:  BP- Lying Pulse- Lying BP- Sitting Pulse- Sitting BP- Standing at 0 minutes Pulse- Standing at  0 minutes  07/27/18 1053 (!) 143/92 68 141/90 71 (!) 135/95 77     No results found for this or any previous visit (from the past 72 hour(s)). No results found.    Assessment and Plan: 57 y.o. male with   .[name] was seen today for dizziness.  Diagnoses and all orders for this visit:  BPPV (benign paroxysmal positional vertigo), right -     Ambulatory referral to Physical Therapy   Reassuring neuro exam.  Symptoms reproduced with Dix-Hallpike maneuver on the right side today.  Patient was instructed on the Epley maneuver.  Referral was also placed to physical therapy for vestibular rehab if needed.  Due to the nature of his job a work note was provided for restrictions.  Specifically he should not be climbing, working at [employer], or operating machinery    Patient education and anticipatory guidance given Patient agrees with treatment plan Follow-up as needed if symptoms worsen or fail to improve  Levonne Hubert PA-C

## 2018-07-27 NOTE — Patient Instructions (Signed)

## 2018-07-28 ENCOUNTER — Ambulatory Visit: Payer: 59 | Admitting: Physical Therapy

## 2018-07-28 ENCOUNTER — Encounter: Payer: Self-pay | Admitting: Physician Assistant

## 2018-07-28 ENCOUNTER — Telehealth: Payer: Self-pay

## 2018-07-28 NOTE — Telephone Encounter (Signed)
Letter written to return to work when he is symptom-free

## 2018-07-28 NOTE — Telephone Encounter (Signed)
Recommendations left on vm -EH/RMA  

## 2018-07-28 NOTE — Telephone Encounter (Signed)
PT came in to the office today asking for a letter allowing him to go back to work, without exceptions. He is feeling fine and no longer needs to be on light duty. He will be going back to work 08/02/18

## 2018-08-01 ENCOUNTER — Encounter: Payer: Self-pay | Admitting: Physician Assistant

## 2018-09-11 ENCOUNTER — Encounter: Payer: Self-pay | Admitting: Physician Assistant

## 2018-09-24 IMAGING — US US ABDOMEN COMPLETE W/ ELASTOGRAPHY
1 series · 13 of 25 positions shown · non-contrast
Comparison: None.

CLINICAL DATA: Hepatitis-C



[Series 1: us abdomen complete w/ elastography · 0.15mm/px · 13 of 83 slices shown]
[im 1/83]
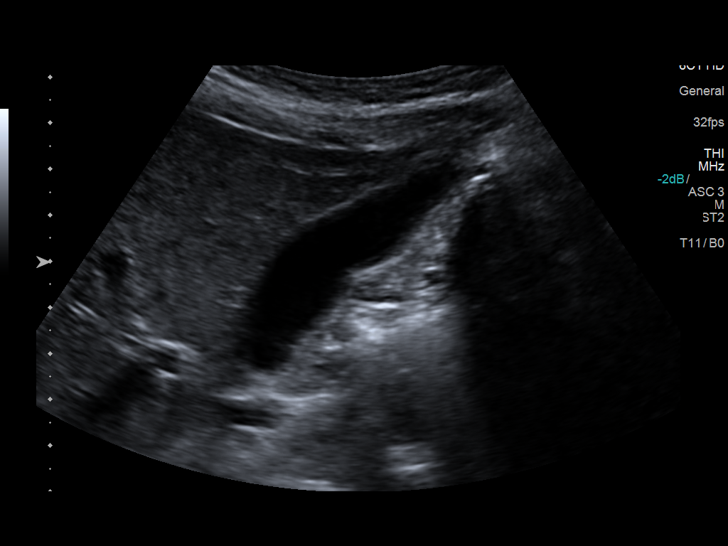
[im 7/83]
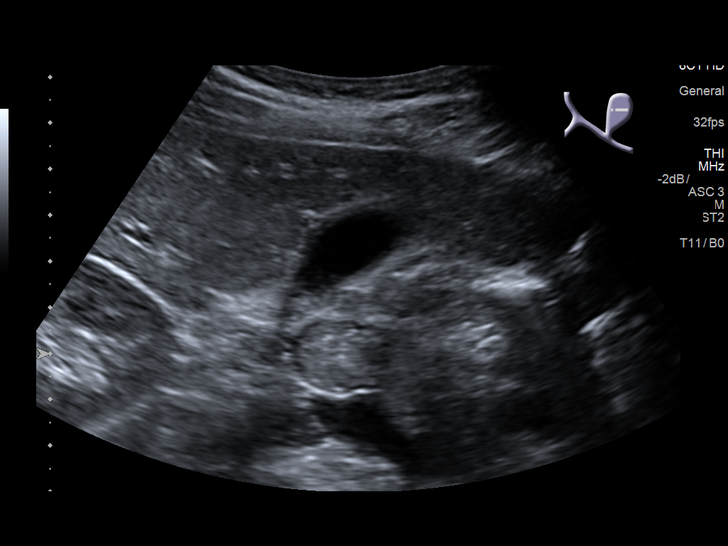
[im 14/83]
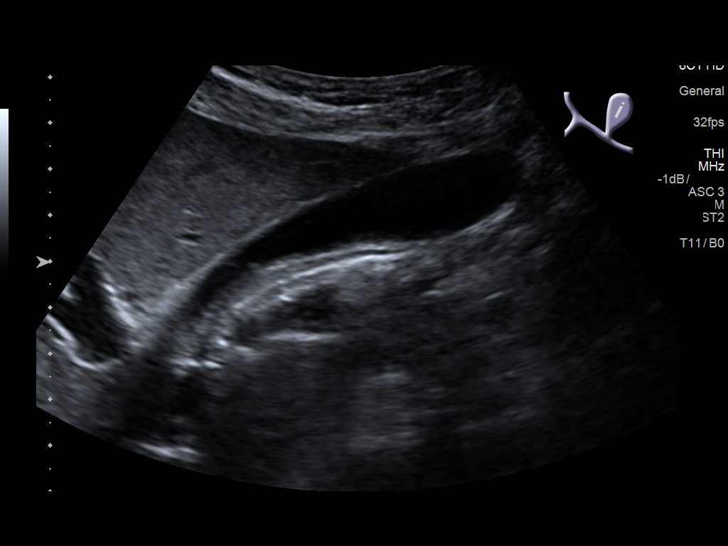
[im 21/83]
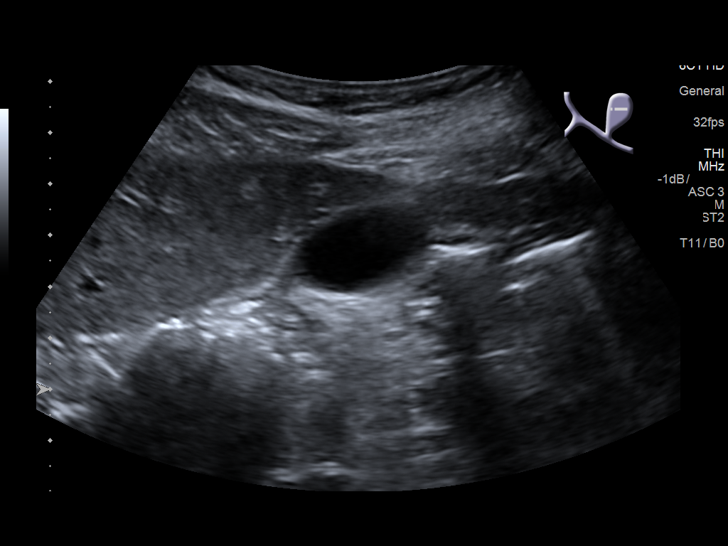
[im 28/83]
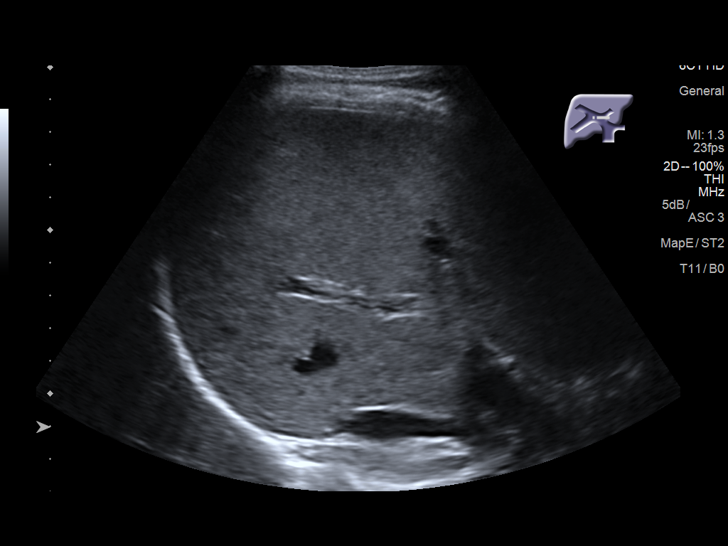
[im 35/83]
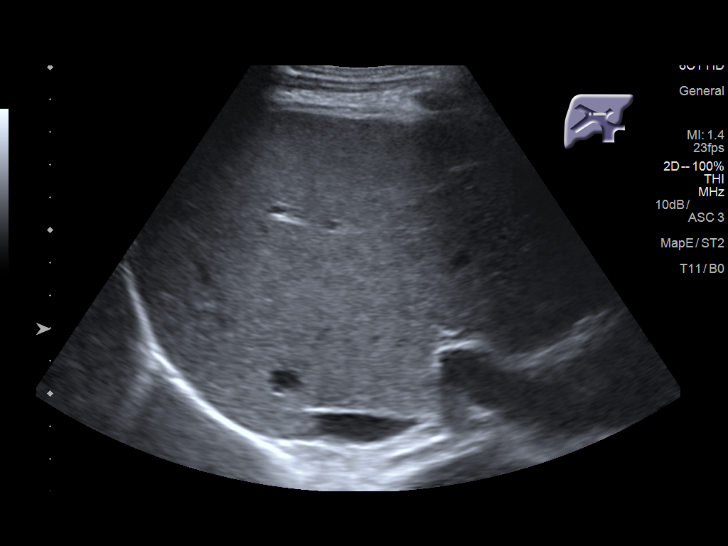
[im 42/83]
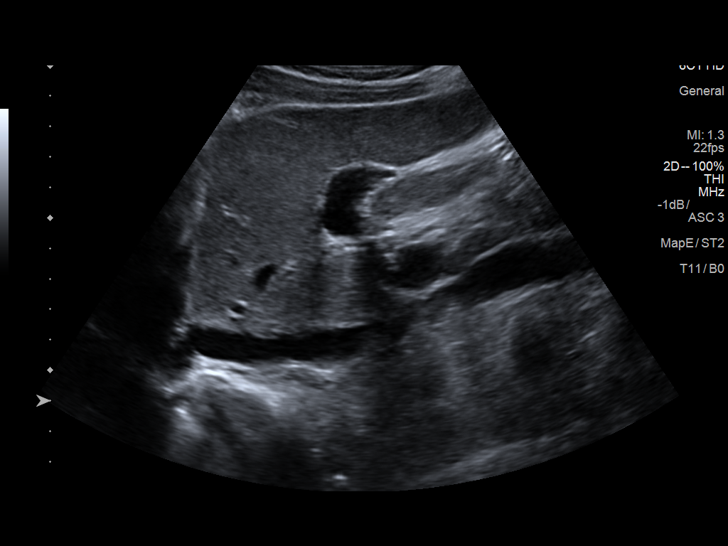
[im 48/83]
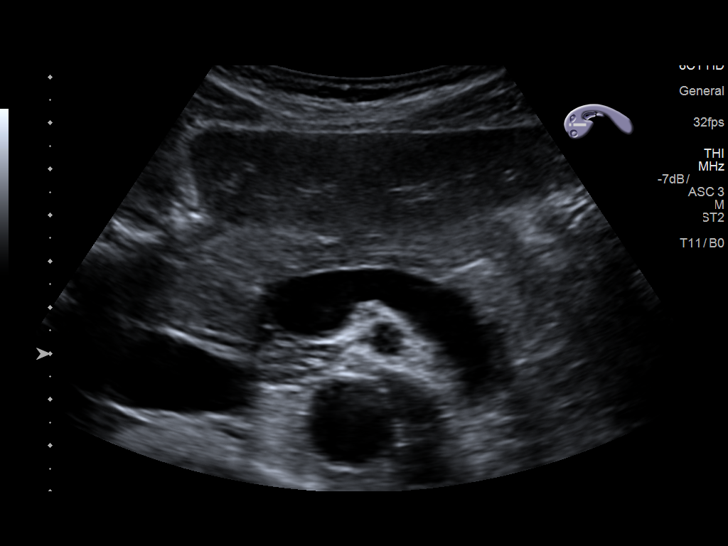
[im 55/83]
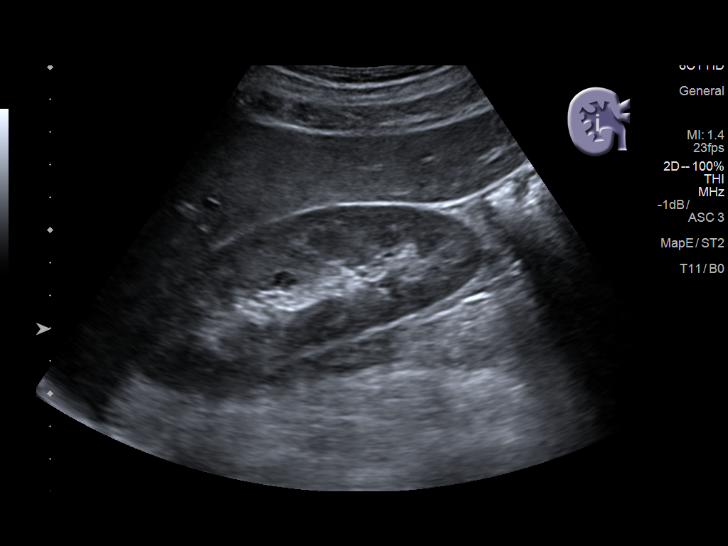
[im 62/83]
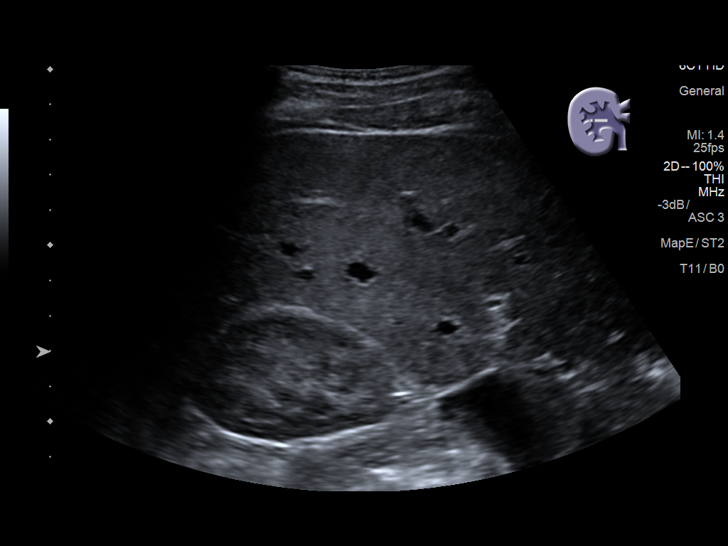
[im 69/83]
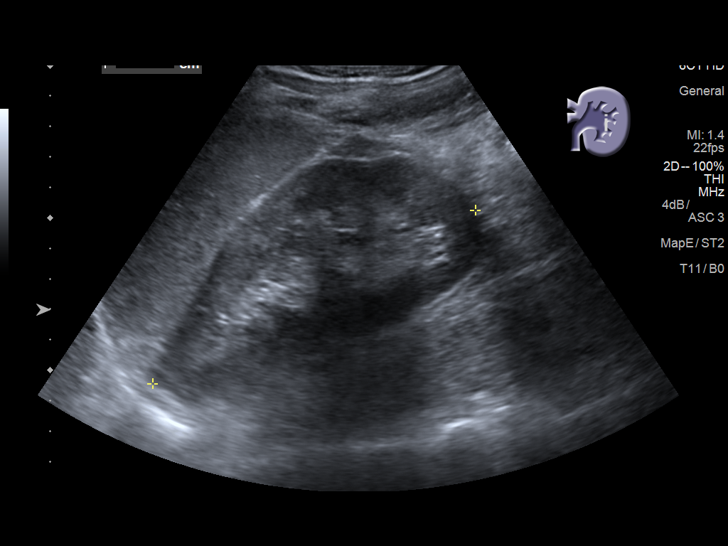
[im 76/83]
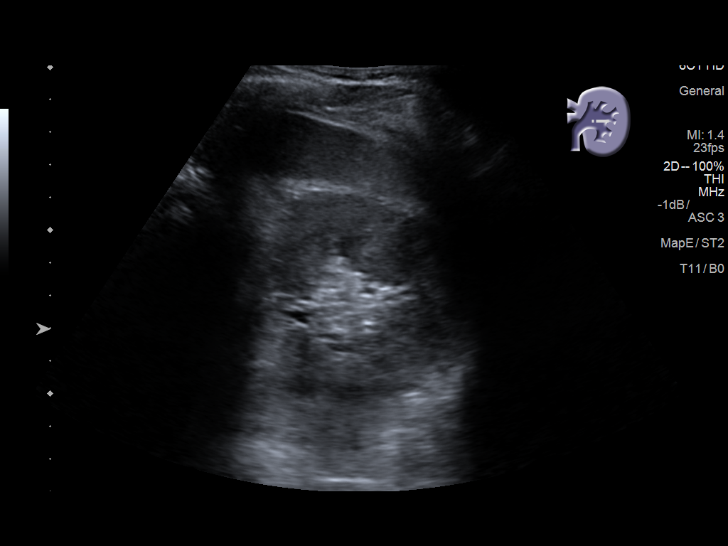
[im 83/83]
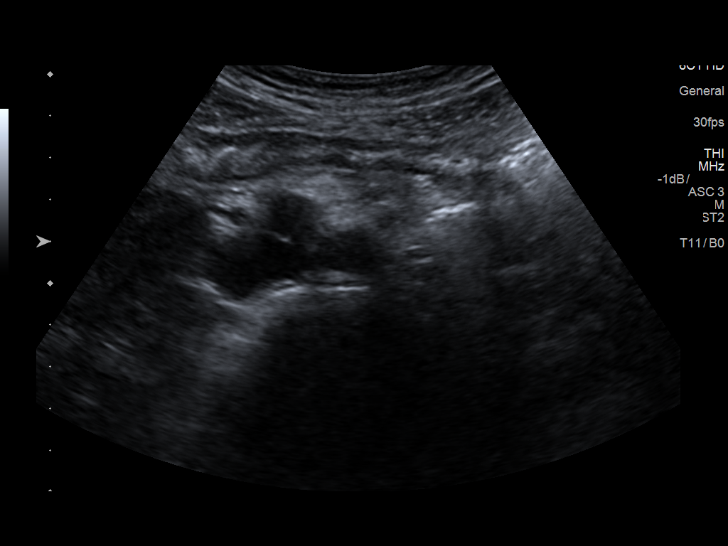

[13 of 25 positions shown; findings below may reference images not displayed]

FINDINGS: ULTRASOUND ABDOMEN

Gallbladder: No gallstones or wall thickening visualized. No
sonographic Murphy sign noted by sonographer.

Common bile duct: Diameter: 1.9 mm

Liver: No focal lesion identified. Within normal limits in
parenchymal echogenicity. Portal vein is patent on color Doppler
imaging with normal direction of blood flow towards the liver.

IVC: No abnormality visualized.

Pancreas: Visualized portion unremarkable.

Spleen: Size and appearance within normal limits.

Right Kidney: Length: 11.5 cm. Cyst within the midpole measures 6 x
6 x 9 mm. Echogenicity within normal limits. No mass or
hydronephrosis visualized.

Left Kidney: Length: 12.0 cm. Cyst within the inferior pole measures
11 x 9 x 9 mm. Echogenicity within normal limits. No mass or
hydronephrosis visualized.

Abdominal aorta: No aneurysm visualized.

Other findings: None.

ULTRASOUND HEPATIC ELASTOGRAPHY

Device: Siemens Helix VTQ

Patient position: Supine

Transducer 6C1

Number of measurements: 10

Hepatic segment:  8

Median velocity:   1.11  m/sec

IQR:

IQR/Median velocity ratio:

Corresponding Metavir fibrosis score:  F0/F1

Risk of fibrosis: Minimal

Limitations of exam: None

Pertinent findings noted on other imaging exams:  None

Please note that abnormal shear wave velocities may also be
identified in clinical settings other than with hepatic fibrosis,
such as: acute hepatitis, elevated right heart and central venous
pressures including use of beta blockers, Giforiyo disease
(Leiliane), infiltrative processes such as
mastocytosis/amyloidosis/infiltrative tumor, extrahepatic
cholestasis, in the post-prandial state, and liver transplantation.
Correlation with patient history, laboratory data, and clinical
condition recommended.
IMPRESSION: ULTRASOUND ABDOMEN:

1. Bilateral kidney cysts.

ULTRASOUND HEPATIC ELASTOGRAPHY:

Median hepatic shear wave velocity is calculated at 1.11 m/sec.

Corresponding Metavir fibrosis score is  F0/F1.

Risk of fibrosis is minimal.

Follow-up: None required

## 2018-09-28 ENCOUNTER — Encounter: Payer: Self-pay | Admitting: Physician Assistant

## 2018-09-29 ENCOUNTER — Ambulatory Visit: Payer: 59 | Admitting: Physician Assistant

## 2018-09-29 ENCOUNTER — Encounter: Payer: Self-pay | Admitting: Physician Assistant

## 2018-09-29 VITALS — BP 148/96 | HR 77 | Wt 158.0 lb

## 2018-09-29 DIAGNOSIS — H8093 Unspecified otosclerosis, bilateral: Secondary | ICD-10-CM

## 2018-09-29 DIAGNOSIS — I10 Essential (primary) hypertension: Secondary | ICD-10-CM

## 2018-09-29 HISTORY — DX: Unspecified otosclerosis, bilateral: H80.93

## 2018-09-29 MED ORDER — VALSARTAN 320 MG PO TABS: 320.0000 mg | ORAL_TABLET | Freq: Every day | ORAL | 1 refills | Status: DC

## 2018-09-29 NOTE — Progress Notes (Addendum)
HPI:                                                                Melvin Rich is a 58 y.o. male who presents to [facility]: [facility] today for elevated blood pressure  HTN: taking Valsartan 160 mg daily. Compliant with medications. At chiropractor yesterday BP was 158/110 and then at home it was 150/105. He does not regularly monitor his BP at home. He is drinking 4-6 beers per night. He has had a mild frontal headache for about 1 week. Denies vision change, chest pain with exertion, orthopnea, lightheadedness, syncope and edema. Risk factors include: tobacco use, male sex   Past Medical History:  Diagnosis Date  . Alcohol use disorder   . Chronic active hepatitis (HCC) 04/23/2017  . Chronic hepatitis C virus infection (HCC) 04/23/2017  . Degenerative disc disease, cervical   . Degenerative disc disease, cervical   . History of drug use disorder    Heroin, IVDA  . Hypertension   . Rheumatoid arthritis (HCC)   . Substance abuse (HCC)   . Transaminitis 04/23/2017   Past Surgical History:  Procedure Laterality Date  . HIP SURGERY  2005  . SHOULDER SURGERY  1985   Social History   Tobacco Use  . Smoking status: Current Every Day Smoker    Packs/day: 0.50    Years: 47.00    Pack years: 23.50    Types: Cigarettes  . Smokeless tobacco: Never Used  Substance Use Topics  . Alcohol use: Yes    Alcohol/week: 4.0 standard drinks    Types: 4 Cans of beer per week    Comment: 20 beers per week   He was adopted. Family history is unknown by patient.    ROS: negative except as noted in the HPI  Medications: Current Outpatient Medications  Medication Sig Dispense Refill  . aspirin 81 MG tablet Take 1 tablet (81 mg total) by mouth daily. 30 tablet 11  . fluticasone (FLONASE) 50 MCG/ACT nasal spray Place 1 spray into both nostrils daily. 16 g 3  . gabapentin (NEURONTIN) 300 MG capsule Take 1 capsule (300 mg total) by mouth at  bedtime. 90 capsule 1  . meloxicam (MOBIC) 15 MG tablet Take 1 tablet (15 mg total) by mouth daily. 90 tablet 1  . Multiple Vitamins-Minerals (CENTRUM ADULTS PO) Take by mouth.    . sildenafil (VIAGRA) 100 MG tablet Take 1 tablet (100 mg total) by mouth as needed for erectile dysfunction (for use prior to sexual activity). 30 tablet 11  . valsartan (DIOVAN) 160 MG tablet Take 1 tablet (160 mg total) by mouth daily. 90 tablet 1   No current facility-administered medications for this visit.    No Known Allergies     Objective:  BP (!) 148/96   Pulse 77   Wt 158 lb (71.7 kg)   BMI 23.00 kg/m  Gen:  alert, not ill-appearing, no distress, appropriate for age HEENT: head normocephalic without obvious abnormality, conjunctiva and cornea clear, no sinus tenderness, tympanic membranes semi-transparent bilaterally, neck supple, no cervical adenopathy, trachea midline Pulm: Normal work of breathing, normal phonation, clear to auscultation bilaterally, no wheezes, rales or rhonchi CV: Normal rate, regular rhythm, s1 and s2 distinct, no  murmurs, clicks or rubs  Neuro: alert and oriented x 3, no tremor MSK: extremities atraumatic, normal gait and station Skin: intact, no rashes on exposed skin, no jaundice, no cyanosis  Lab Results  Component Value Date   CREATININE 0.70 07/23/2018   BUN 17 07/23/2018   NA 135 07/23/2018   K 4.3 07/23/2018   CL 104 07/23/2018   CO2 27 07/23/2018   CrCl cannot be calculated (Patient's most recent lab result is older than the maximum 21 days allowed.).  The 10-year ASCVD risk score Denman George DC Montez Hageman., et al., 2013) is: 11.1%   Values used to calculate the score:     Age: 78 years     Sex: Male     Is Non-Hispanic African American: No     Diabetic: No     Tobacco smoker: Yes     Systolic Blood Pressure: 148 mmHg     Is BP treated: Yes     HDL Cholesterol: 62 mg/dL     Total Cholesterol: 153 mg/dL   No results found for this or any previous visit (from the  past 72 hour(s)). No results found.    Assessment and Plan: 58 y.o. male with   .Aaden was seen today for hypertension.  Diagnoses and all orders for this visit:  Hypertension goal BP (blood pressure) < 130/80 -     valsartan (DIOVAN) 320 MG tablet; Take 1 tablet (320 mg total) by mouth daily.   BP goal <130/80 BP out of range in office today and at chiropractors office Increasing Valsartan to 320 mg QD Cont baby asa for primary prevention Encouraged to reduce beer consumption by 1 beer nightly per week to goal of 2 beers daily Patient to monitor and log BP's at home. Send MyChart message with home readings in 1 week   Patient education and anticipatory guidance given Patient agrees with treatment plan Follow-up in 1 month for HTN or sooner as needed if symptoms worsen or fail to improve  Levonne Hubert PA-C

## 2018-09-29 NOTE — Telephone Encounter (Signed)
Called Pt, this reading was taken at the chiropractors office before his treatment yesterday. Denies any chest pain or SOB. Does state he "feels tingly." Scheduled for OV eval today for elevated BP. Advised if he does develop any CP or SOB to seek emergent care. Verbalized understanding.

## 2018-09-29 NOTE — Patient Instructions (Signed)
For your blood pressure: - Goal <130/80 (Ideally 120's/70's) Increase Valsartan to 320 mg every morning  - take your blood pressure medication in the morning (unless instructed differently) - take baby aspirin 81 mg daily to help prevent heart attack/stroke - monitor and log blood pressures at home - check around the same time each day in a relaxed setting - Limit salt to <2500 mg/day - Follow DASH (Dietary Approach to Stopping Hypertension) eating plan - Try to get at least 150 minutes of aerobic exercise per week - Aim to go on a brisk walk 30 minutes per day at least 5 days per week. If you're not active, gradually increase how long you walk by 5 minutes each week - limit alcohol: 2 standard drinks per day for men and 1 per day for women - avoid tobacco/nicotine products. Consider smoking cessation if you smoke - weight loss: 7% of current body weight can reduce your blood pressure by 5-10 points - follow-up at least every 6 months for your blood pressure. Follow-up sooner if your BP is not controlled  Send me a MyChart message in 1 week with home readings

## 2018-10-04 ENCOUNTER — Encounter: Payer: Self-pay | Admitting: Physician Assistant

## 2018-10-04 ENCOUNTER — Ambulatory Visit (INDEPENDENT_AMBULATORY_CARE_PROVIDER_SITE_OTHER): Payer: 59

## 2018-10-04 ENCOUNTER — Ambulatory Visit: Payer: 59 | Admitting: Sports Medicine

## 2018-10-04 ENCOUNTER — Encounter: Payer: Self-pay | Admitting: Sports Medicine

## 2018-10-04 DIAGNOSIS — K639 Disease of intestine, unspecified: Secondary | ICD-10-CM | POA: Insufficient documentation

## 2018-10-04 DIAGNOSIS — R0781 Pleurodynia: Secondary | ICD-10-CM

## 2018-10-04 HISTORY — DX: Disease of intestine, unspecified: K63.9

## 2018-10-04 NOTE — Assessment & Plan Note (Signed)
I explained to Melvin Rich the benign nature of this. We are going to get a left-sided rib series with chest x-ray. The pain is noncardiac. No symptoms of GERD. No melena or hematochezia, no hematemesis. We can simply watch this for now.
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
## 2018-10-04 NOTE — Progress Notes (Signed)
Subjective:    CC: Left-sided chest wall pain  HPI: For the past several days to weeks this pleasant 58 year old male has had discomfort that he localizes on his left rib cage in the mid axillary line, approximately the level of the xiphoid process.  It is not pleuritic, its not associated with exertion or better with rest, it does not radiate.  No nausea, diaphoresis.  No presyncope.  No palpitations.  He exercises at a high level, runs/walks several miles per day without any discomfort.  No long car drives, no leg swelling.  No skin rash in the side of the chest wall pain.  It is intermittent and not associated with any foods, no melena, hematochezia, hematemesis.  I reviewed the past medical history, family history, social history, surgical history, and allergies today and no changes were needed.  Please see the problem list section below in epic for further details.  Past Medical History: Past Medical History:  Diagnosis Date  . Alcohol use disorder   . Chronic active hepatitis (HCC) 04/23/2017  . Chronic hepatitis C virus infection (HCC) 04/23/2017  . Degenerative disc disease, cervical   . Degenerative disc disease, cervical   . History of drug use disorder    Heroin, IVDA  . Hypertension   . 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 Rheumatoid arthritis (HCC)   . Substance abuse (HCC)   . Transaminitis 04/23/2017   Past Surgical History: Past Surgical History:  Procedure Laterality Date  . HIP SURGERY  2005  . SHOULDER SURGERY  1985   Social History: Social History   Socioeconomic History  . Marital status: Married    Spouse name: Not on file  . Number of children: Not on file  . Years of education: Not on file  . Highest education level: Not on file  Occupational History  . Not on file  Social Needs  . Financial resource strain: Not on file  . Food insecurity:    Worry: Not on file    Inability: Not on file  . Transportation needs:    Medical: Not on file   Non-medical: Not on file  Tobacco Use  . Smoking status: Current Every Day Smoker    Packs/day: 0.50    Years: 47.00    Pack years: 23.50    Types: Cigarettes  . Smokeless tobacco: Never Used  Substance and Sexual Activity  . Alcohol use: Yes    Alcohol/week: 4.0 standard drinks    Types: 4 Cans of beer per week    Comment: 20 beers per week  . Drug use: No  . Sexual activity: Yes  Lifestyle  . Physical activity:    Days per week: Not on file    Minutes per session: Not on file  . Stress: Not on file  Relationships  . Social connections:    Talks on phone: Not on file    Gets together: Not on file    Attends religious service: Not on file    Active member of club or organization: Not on file    Attends meetings of clubs or organizations: Not on file    Relationship status: Not on file  Other Topics Concern  . Not on file  Social History Narrative  . Not on file   Family History: Family History  Adopted: Yes  Family history unknown: Yes   Allergies: No Known Allergies Medications: See med rec.  Review of Systems: No fevers, chills, night sweats, weight loss, chest pain, or shortness of breath.  Objective:    General: Well Developed, well nourished, and in no acute distress.  Neuro: Alert and oriented x3, extra-ocular muscles intact, sensation grossly intact.  HEENT: Normocephalic, atraumatic, pupils equal round reactive to light, neck supple, no masses, no lymphadenopathy, thyroid nonpalpable.  Skin: Warm and dry, no rashes. Cardiac: Regular rate and rhythm, no murmurs rubs or gallops, no lower extremity edema.  Respiratory: Clear to auscultation bilaterally. Not using accessory muscles, speaking in full sentences.  No tenderness whatsoever along the left chest wall. Abdomen: Soft, nontender, nondistended, no bowel sounds, no palpable masses, no guarding, rigidity, rebound tenderness.  Impression and Recommendations:    Splenic flexure syndrome I explained to  Dorinda Hill the benign nature of this. We are going to get a left-sided rib series with chest x-ray. The pain is noncardiac. No symptoms of GERD. No melena or hematochezia, no hematemesis. We can simply watch this for now. ___________________________________________ [name]. Benjamin Stain, M.D., ABFM., CAQSM. Primary Care and Sports Medicine [facility]  Adjunct Professor of Family Medicine  [employer]

## 2018-11-22 ENCOUNTER — Encounter: Payer: Self-pay | Admitting: Physician Assistant

## 2018-11-23 ENCOUNTER — Other Ambulatory Visit: Payer: Self-pay

## 2018-11-23 ENCOUNTER — Encounter: Payer: Self-pay | Admitting: Sports Medicine

## 2018-11-23 ENCOUNTER — Ambulatory Visit: Payer: 59 | Admitting: Sports Medicine

## 2018-11-23 DIAGNOSIS — M5030 Other cervical disc degeneration, unspecified cervical region: Secondary | ICD-10-CM

## 2018-11-23 DIAGNOSIS — G959 Disease of spinal cord, unspecified: Secondary | ICD-10-CM | POA: Diagnosis not present

## 2018-11-23 MED ORDER — PREDNISONE 50 MG PO TABS: ORAL_TABLET | ORAL | 0 refills | Status: DC

## 2018-11-23 NOTE — Assessment & Plan Note (Signed)
I think Melvin Rich has developed some cervical myelopathy at this point. Adding 5 days of prednisone, home rehab exercises. He already had x-rays done at the chiropractor office, adding an MRI. He has bilateral C7 radicular symptoms, weakness, as well as a positive Hoffmann sign on the left.

## 2018-11-23 NOTE — Progress Notes (Signed)
Subjective:    CC:   Bilateral elbow pain, hand numbness  HPI: For several months now this pleasant 58 year old male has had pain in both elbows, with radiation over the dorsum of both hands with numbness and tingling.  He does have progressive weakness.  Does have a long history of cervical DDD from years ago.  He has seen a chiropractor, has had greater than 6 weeks of treatment, chiropractor did do x-rays which showed some widespread degenerative changes.  Symptoms are moderate, worsening, localized in both elbows with radiation down to the dorsum of the hands.  I reviewed the past medical history, family history, social history, surgical history, and allergies today and no changes were needed.  Please see the problem list section below in epic for further details.  Past Medical History: Past Medical History:  Diagnosis Date  . Alcohol use disorder   . Chronic active hepatitis (HCC) 04/23/2017  . Chronic hepatitis C virus infection (HCC) 04/23/2017  . Degenerative disc disease, cervical   . Degenerative disc disease, cervical   . History of drug use disorder    Heroin, IVDA  . Hypertension   . 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 Rheumatoid arthritis (HCC)   . Substance abuse (HCC)   . Transaminitis 04/23/2017   Past Surgical History: Past Surgical History:  Procedure Laterality Date  . HIP SURGERY  2005  . SHOULDER SURGERY  1985   Social History: Social History   Socioeconomic History  . Marital status: Married    Spouse name: Not on file  . Number of children: Not on file  . Years of education: Not on file  . Highest education level: Not on file  Occupational History  . Not on file  Social Needs  . Financial resource strain: Not on file  . Food insecurity:    Worry: Not on file    Inability: Not on file  . Transportation needs:    Medical: Not on file    Non-medical: Not on file  Tobacco Use  . Smoking status: Current Every Day Smoker    Packs/day: 0.50   Years: 47.00    Pack years: 23.50    Types: Cigarettes  . Smokeless tobacco: Never Used  Substance and Sexual Activity  . Alcohol use: Yes    Alcohol/week: 4.0 standard drinks    Types: 4 Cans of beer per week    Comment: 20 beers per week  . Drug use: No  . Sexual activity: Yes  Lifestyle  . Physical activity:    Days per week: Not on file    Minutes per session: Not on file  . Stress: Not on file  Relationships  . Social connections:    Talks on phone: Not on file    Gets together: Not on file    Attends religious service: Not on file    Active member of club or organization: Not on file    Attends meetings of clubs or organizations: Not on file    Relationship status: Not on file  Other Topics Concern  . Not on file  Social History Narrative  . Not on file   Family History: Family History  Adopted: Yes  Family history unknown: Yes   Allergies: No Known Allergies Medications: See med rec.  Review of Systems: No fevers, chills, night sweats, weight loss, chest pain, or shortness of breath.   Objective:    General: Well Developed, well nourished, and in no acute distress.  Neuro: Alert and oriented x3,  extra-ocular muscles intact, sensation grossly intact.  HEENT: Normocephalic, atraumatic, pupils equal round reactive to light, neck supple, no masses, no lymphadenopathy, thyroid nonpalpable.  Skin: Warm and dry, no rashes. Cardiac: Regular rate and rhythm, no murmurs rubs or gallops, no lower extremity edema.  Respiratory: Clear to auscultation bilaterally. Not using accessory muscles, speaking in full sentences. Neck: Negative spurling's Full neck range of motion Grip strength and sensation normal in bilateral hands Strength good C4 to T1 distribution No sensory change to C4 to T1 Hypoactive reflexes throughout, positive Hoffmann sign on the left  Impression and Recommendations:    DDD (degenerative disc disease), cervical I think Melvin Rich has developed some  cervical myelopathy at this point. Adding 5 days of prednisone, home rehab exercises. He already had x-rays done at the chiropractor office, adding an MRI. He has bilateral C7 radicular symptoms, weakness, as well as a positive Hoffmann sign on the left.    ___________________________________________ [name]. Benjamin Stain, M.D., ABFM., CAQSM. Primary Care and Sports Medicine [facility]  Adjunct Professor of Family Medicine  [employer]

## 2018-11-28 ENCOUNTER — Ambulatory Visit (INDEPENDENT_AMBULATORY_CARE_PROVIDER_SITE_OTHER): Payer: 59

## 2018-11-28 ENCOUNTER — Other Ambulatory Visit: Payer: Self-pay

## 2018-11-28 DIAGNOSIS — M4802 Spinal stenosis, cervical region: Secondary | ICD-10-CM

## 2018-11-28 DIAGNOSIS — M47812 Spondylosis without myelopathy or radiculopathy, cervical region: Secondary | ICD-10-CM | POA: Diagnosis not present

## 2018-11-28 DIAGNOSIS — G959 Disease of spinal cord, unspecified: Secondary | ICD-10-CM

## 2018-11-28 DIAGNOSIS — M5030 Other cervical disc degeneration, unspecified cervical region: Secondary | ICD-10-CM

## 2018-11-29 ENCOUNTER — Encounter: Payer: Self-pay | Admitting: Sports Medicine

## 2018-12-14 ENCOUNTER — Encounter: Payer: Self-pay | Admitting: Sports Medicine

## 2018-12-14 ENCOUNTER — Ambulatory Visit (INDEPENDENT_AMBULATORY_CARE_PROVIDER_SITE_OTHER): Payer: 59 | Admitting: Sports Medicine

## 2018-12-14 DIAGNOSIS — M5030 Other cervical disc degeneration, unspecified cervical region: Secondary | ICD-10-CM

## 2018-12-14 MED ORDER — GABAPENTIN 800 MG PO TABS: 800.0000 mg | ORAL_TABLET | Freq: Two times a day (BID) | ORAL | 1 refills | Status: DC

## 2018-12-14 NOTE — Progress Notes (Signed)
Virtual Visit via Telephone   I connected with  Melvin Rich  on 12/14/18 by telephone and verified that I am speaking with the correct person using two identifiers.   I discussed the limitations, risks, security and privacy concerns of performing an evaluation and management service by telephone, including the higher likelihood of inaccurate diagnosis and treatment, and the availability of in person appointments.  We also discussed the likely need of an additional face to face encounter for complete and high quality delivery of care.  I also discussed with the patient that there may be a patient responsible charge related to this service. The patient expressed understanding and wishes to proceed.  Subjective:    CC: Go over cervical spine MRI  HPI: [name] is a pleasant 58 year old male with a history of cervical degenerative disc disease, we obtained an MRI due to some weakness, his principal issue is C7 radicular pain with numbness and tingling.  The MRI will be dictated below.  Overall he is doing okay after the burst of prednisone, still has some paresthesias, but overall better.  He does recall being on gabapentin approximately 3 months ago with good efficacy at 900 mg twice daily.  He would like to try this again.  No progressive weakness, no constitutional symptoms or trauma.  I reviewed the past medical history, family history, social history, surgical history, and allergies today and no changes were needed.  Please see the problem list section below in epic for further details.  Past Medical History: Past Medical History:  Diagnosis Date  . Alcohol use disorder   . Chronic active hepatitis (HCC) 04/23/2017  . Chronic hepatitis C virus infection (HCC) 04/23/2017  . Degenerative disc disease, cervical   . Degenerative disc disease, cervical   . History of drug use disorder    Heroin, IVDA  . Hypertension   . 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 Rheumatoid arthritis (HCC)    . Substance abuse (HCC)   . Transaminitis 04/23/2017   Past Surgical History: Past Surgical History:  Procedure Laterality Date  . HIP SURGERY  2005  . SHOULDER SURGERY  1985   Social History: Social History   Socioeconomic History  . Marital status: Married    Spouse name: Not on file  . Number of children: Not on file  . Years of education: Not on file  . Highest education level: Not on file  Occupational History  . Not on file  Social Needs  . Financial resource strain: Not on file  . Food insecurity:    Worry: Not on file    Inability: Not on file  . Transportation needs:    Medical: Not on file    Non-medical: Not on file  Tobacco Use  . Smoking status: Current Every Day Smoker    Packs/day: 0.50    Years: 47.00    Pack years: 23.50    Types: Cigarettes  . Smokeless tobacco: Never Used  Substance and Sexual Activity  . Alcohol use: Yes    Alcohol/week: 4.0 standard drinks    Types: 4 Cans of beer per week    Comment: 20 beers per week  . Drug use: No  . Sexual activity: Yes  Lifestyle  . Physical activity:    Days per week: Not on file    Minutes per session: Not on file  . Stress: Not on file  Relationships  . Social connections:    Talks on phone: Not on file    Gets  together: Not on file    Attends religious service: Not on file    Active member of club or organization: Not on file    Attends meetings of clubs or organizations: Not on file    Relationship status: Not on file  Other Topics Concern  . Not on file  Social History Narrative  . Not on file   Family History: Family History  Adopted: Yes  Family history unknown: Yes   Allergies: No Known Allergies Medications: See med rec.  Review of Systems: No fevers, chills, night sweats, weight loss, chest pain, or shortness of breath.   Objective:    General: Speaking full sentences, no audible heavy breathing.  Sounds alert and appropriately interactive.  No other physical exam  performed due to the non-face to face nature of this visit.  MRI personally reviewed, he does have widespread cervical DDD with multilevel cervical spinal stenosis, likely there is no abnormal cord signal/myelomalacia.  Impression and Recommendations:    DDD (degenerative disc disease), cervical [name] has multilevel cervical DDD evidenced on MRI with bilateral C7 radicular seroma did have a bit of weakness in the Hoffmann sign, likely there was no signs of myelomalacia abnormal cord signal. He has been off of gabapentin for 3 months now but seemed to do well with it. He told me he was taking 900 mg twice daily. I am going to restart gabapentin at 800 mg twice a day, he can go up on the dose if he desires to a maximum of 2 tabs twice a day.  We will do another telephone encounter in 1 month.  Patient will call to make the appointment.   I discussed the above assessment and treatment plan with the patient. The patient was provided an opportunity to ask questions and all were answered. The patient agreed with the plan and demonstrated an understanding of the instructions.   The patient was advised to call back or seek an in-person evaluation if the symptoms worsen or if the condition fails to improve as anticipated.   I provided 21 minutes of non-face-to-face time during this encounter, less than 50% of this was time needed to gather information, review chart, records, and complete documentation.   ___________________________________________ [name]. Benjamin Stain, M.D., ABFM., CAQSM. Primary Care and Sports Medicine [facility]  Adjunct Professor of Family Medicine  [employer]

## 2018-12-14 NOTE — Assessment & Plan Note (Signed)
Melvin Rich has multilevel cervical DDD evidenced on MRI with bilateral C7 radicular seroma did have a bit of weakness in the Hoffmann sign, likely there was no signs of myelomalacia abnormal cord signal. He has been off of gabapentin for 3 months now but seemed to do well with it. He told me he was taking 900 mg twice daily. I am going to restart gabapentin at 800 mg twice a day, he can go up on the dose if he desires to a maximum of 2 tabs twice a day.  We will do another telephone encounter in 1 month.  Patient will call to make the appointment.

## 2019-01-05 ENCOUNTER — Ambulatory Visit: Payer: 59 | Admitting: Sports Medicine

## 2019-01-05 ENCOUNTER — Encounter: Payer: Self-pay | Admitting: Sports Medicine

## 2019-01-05 DIAGNOSIS — M5030 Other cervical disc degeneration, unspecified cervical region: Secondary | ICD-10-CM | POA: Diagnosis not present

## 2019-01-05 MED ORDER — HYDROCODONE-ACETAMINOPHEN 5-325 MG PO TABS: 1.0000 | ORAL_TABLET | Freq: Three times a day (TID) | ORAL | 0 refills | Status: DC | PRN

## 2019-01-05 NOTE — Assessment & Plan Note (Addendum)
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 Bilateral left worse than right C6 and C7 radiculopathy. At this point we are going to proceed with a left C6-C7 interlaminar epidural injection. Continue gabapentin, 800 mg twice a day is not effective. Return to see me 1 month after the injection, I do anticipate that he will need a C5-C7 ACDF in the future. Adding a bit of hydrocodone for pain relief in the meantime.

## 2019-01-05 NOTE — Progress Notes (Signed)
Subjective:    CC: Follow-up  HPI: Melvin Rich is a pleasant 58 year old male with severe cervical DDD, central stenosis at multiple levels.  Bilateral C7 radiculitis, no progressive weakness, more recently gabapentin at 800 mg twice daily has not been effective, and he has now permanent numbness in his left hand in a C6 and C7 distribution.  I reviewed the past medical history, family history, social history, surgical history, and allergies today and no changes were needed.  Please see the problem list section below in epic for further details.  Past Medical History: Past Medical History:  Diagnosis Date  . Alcohol use disorder   . Chronic active hepatitis (HCC) 04/23/2017  . Chronic hepatitis C virus infection (HCC) 04/23/2017  . Degenerative disc disease, cervical   . Degenerative disc disease, cervical   . History of drug use disorder    Heroin, IVDA  . Hypertension   . 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 Rheumatoid arthritis (HCC)   . Substance abuse (HCC)   . Transaminitis 04/23/2017   Past Surgical History: Past Surgical History:  Procedure Laterality Date  . HIP SURGERY  2005  . SHOULDER SURGERY  1985   Social History: Social History   Socioeconomic History  . Marital status: Married    Spouse name: Not on file  . Number of children: Not on file  . Years of education: Not on file  . Highest education level: Not on file  Occupational History  . Not on file  Social Needs  . Financial resource strain: Not on file  . Food insecurity:    Worry: Not on file    Inability: Not on file  . Transportation needs:    Medical: Not on file    Non-medical: Not on file  Tobacco Use  . Smoking status: Current Every Day Smoker    Packs/day: 0.50    Years: 47.00    Pack years: 23.50    Types: Cigarettes  . Smokeless tobacco: Never Used  Substance and Sexual Activity  . Alcohol use: Yes    Alcohol/week: 4.0 standard drinks    Types: 4 Cans of beer per week    Comment: 20  beers per week  . Drug use: No  . Sexual activity: Yes  Lifestyle  . Physical activity:    Days per week: Not on file    Minutes per session: Not on file  . Stress: Not on file  Relationships  . Social connections:    Talks on phone: Not on file    Gets together: Not on file    Attends religious service: Not on file    Active member of club or organization: Not on file    Attends meetings of clubs or organizations: Not on file    Relationship status: Not on file  Other Topics Concern  . Not on file  Social History Narrative  . Not on file   Family History: Family History  Adopted: Yes  Family history unknown: Yes   Allergies: No Known Allergies Medications: See med rec.  Review of Systems: No fevers, chills, night sweats, weight loss, chest pain, or shortness of breath.   Objective:    General: Well Developed, well nourished, and in no acute distress.  Neuro: Alert and oriented x3, extra-ocular muscles intact, sensation grossly intact.  HEENT: Normocephalic, atraumatic, pupils equal round reactive to light, neck supple, no masses, no lymphadenopathy, thyroid nonpalpable.  Skin: Warm and dry, no rashes. Cardiac: Regular rate and rhythm, no murmurs rubs or  gallops, no lower extremity edema.  Respiratory: Clear to auscultation bilaterally. Not using accessory muscles, speaking in full sentences.  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 Bilateral left worse than right C6 and C7 radiculopathy. At this point we are going to proceed with a left C6-C7 interlaminar epidural injection. Continue gabapentin, 800 mg twice a day is not effective. Return to see me 1 month after the injection, I do anticipate that he will need a C5-C7 ACDF in the future. Adding a bit of hydrocodone for pain relief in the meantime.  I spent 25 minutes with this patient, greater than 50%  was face-to-face time counseling regarding the above diagnoses.  ___________________________________________ [name]. Benjamin Stain, M.D., ABFM., CAQSM. Primary Care and Sports Medicine [facility]  Adjunct Professor of Family Medicine  [employer]

## 2019-01-07 ENCOUNTER — Encounter: Payer: Self-pay | Admitting: Sports Medicine

## 2019-01-10 MED ORDER — HYDROCODONE-ACETAMINOPHEN 10-325 MG PO TABS: 1.0000 | ORAL_TABLET | Freq: Three times a day (TID) | ORAL | 0 refills | Status: DC | PRN

## 2019-01-10 NOTE — Addendum Note (Signed)
Addended by: Monica Becton on: 01/10/2019 11:31 AM   Modules accepted: Orders

## 2019-01-18 ENCOUNTER — Encounter: Payer: Self-pay | Admitting: Sports Medicine

## 2019-01-18 DIAGNOSIS — M5030 Other cervical disc degeneration, unspecified cervical region: Secondary | ICD-10-CM

## 2019-01-19 MED ORDER — HYDROCODONE-ACETAMINOPHEN 10-325 MG PO TABS: 1.0000 | ORAL_TABLET | Freq: Three times a day (TID) | ORAL | 0 refills | Status: DC | PRN

## 2019-01-19 NOTE — Addendum Note (Signed)
Addended by: Monica Becton on: 01/19/2019 01:23 PM   Modules accepted: Orders

## 2019-02-02 ENCOUNTER — Other Ambulatory Visit: Payer: Self-pay | Admitting: Orthopedic Surgery

## 2019-02-14 ENCOUNTER — Other Ambulatory Visit (HOSPITAL_COMMUNITY)
Admission: RE | Admit: 2019-02-14 | Discharge: 2019-02-14 | Disposition: A | Discharge status: Home / Self Care | Payer: 59 | Source: Ambulatory Visit | Attending: Orthopedic Surgery | Admitting: Orthopedic Surgery

## 2019-02-14 ENCOUNTER — Other Ambulatory Visit: Payer: Self-pay

## 2019-02-14 ENCOUNTER — Encounter (HOSPITAL_COMMUNITY): Payer: Self-pay

## 2019-02-14 ENCOUNTER — Encounter (HOSPITAL_COMMUNITY)
Admission: RE | Admit: 2019-02-14 | Discharge: 2019-02-14 | Disposition: A | Discharge status: Home / Self Care | Payer: 59 | Source: Ambulatory Visit | Attending: Orthopedic Surgery | Admitting: Orthopedic Surgery

## 2019-02-14 DIAGNOSIS — M069 Rheumatoid arthritis, unspecified: Secondary | ICD-10-CM | POA: Insufficient documentation

## 2019-02-14 DIAGNOSIS — R9431 Abnormal electrocardiogram [ECG] [EKG]: Secondary | ICD-10-CM | POA: Insufficient documentation

## 2019-02-14 DIAGNOSIS — Z1159 Encounter for screening for other viral diseases: Secondary | ICD-10-CM | POA: Insufficient documentation

## 2019-02-14 DIAGNOSIS — Z8619 Personal history of other infectious and parasitic diseases: Secondary | ICD-10-CM | POA: Insufficient documentation

## 2019-02-14 DIAGNOSIS — M79602 Pain in left arm: Secondary | ICD-10-CM | POA: Insufficient documentation

## 2019-02-14 DIAGNOSIS — Z01818 Encounter for other preprocedural examination: Secondary | ICD-10-CM | POA: Insufficient documentation

## 2019-02-14 DIAGNOSIS — H9192 Unspecified hearing loss, left ear: Secondary | ICD-10-CM | POA: Insufficient documentation

## 2019-02-14 DIAGNOSIS — Z79899 Other long term (current) drug therapy: Secondary | ICD-10-CM | POA: Insufficient documentation

## 2019-02-14 DIAGNOSIS — Z7951 Long term (current) use of inhaled steroids: Secondary | ICD-10-CM | POA: Insufficient documentation

## 2019-02-14 DIAGNOSIS — F17200 Nicotine dependence, unspecified, uncomplicated: Secondary | ICD-10-CM | POA: Insufficient documentation

## 2019-02-14 DIAGNOSIS — I10 Essential (primary) hypertension: Secondary | ICD-10-CM | POA: Insufficient documentation

## 2019-02-14 HISTORY — DX: Other seasonal allergic rhinitis: J30.2

## 2019-02-14 HISTORY — DX: Pain in left arm: M79.602

## 2019-02-14 HISTORY — DX: Unspecified hearing loss, unspecified ear: H91.90

## 2019-02-14 LAB — COMPREHENSIVE METABOLIC PANEL
ALT: 29 U/L (ref 0–44)
AST: 33 U/L (ref 15–41)
Albumin: 3.9 g/dL (ref 3.5–5.0)
Alkaline Phosphatase: 143 U/L — ABNORMAL HIGH (ref 38–126)
Anion gap: 8 (ref 5–15)
BUN: 14 mg/dL (ref 6–20)
CO2: 25 mmol/L (ref 22–32)
Calcium: 9.5 mg/dL (ref 8.9–10.3)
Chloride: 102 mmol/L (ref 98–111)
Creatinine, Ser: 0.63 mg/dL (ref 0.61–1.24)
GFR calc Af Amer: >60 mL/min (ref >60)
GFR calc non Af Amer: >60 mL/min (ref >60)
Glucose, Bld: 106 mg/dL — ABNORMAL HIGH (ref 70–99)
Potassium: 4.1 mmol/L (ref 3.5–5.1)
Sodium: 135 mmol/L (ref 135–145)
Total Bilirubin: 1.2 mg/dL (ref 0.3–1.2)
Total Protein: 6.6 g/dL (ref 6.5–8.1)

## 2019-02-14 LAB — CBC WITH DIFFERENTIAL/PLATELET
Abs Immature Granulocytes: 0.02 10*3/uL (ref 0.00–0.07)
Basophils Absolute: 0 10*3/uL (ref 0.0–0.1)
Basophils Relative: 1 %
Eosinophils Absolute: 0.2 10*3/uL (ref 0.0–0.5)
Eosinophils Relative: 4 %
HCT: 39.1 % (ref 39.0–52.0)
Hemoglobin: 13.2 g/dL (ref 13.0–17.0)
Immature Granulocytes: 0 %
Lymphocytes Relative: 34 %
Lymphs Abs: 1.9 10*3/uL (ref 0.7–4.0)
MCH: 29.3 pg (ref 26.0–34.0)
MCHC: 33.8 g/dL (ref 30.0–36.0)
MCV: 86.9 fL (ref 80.0–100.0)
Monocytes Absolute: 0.5 10*3/uL (ref 0.1–1.0)
Monocytes Relative: 9 %
Neutro Abs: 2.8 10*3/uL (ref 1.7–7.7)
Neutrophils Relative %: 52 %
Platelets: 213 10*3/uL (ref 150–400)
RBC: 4.5 MIL/uL (ref 4.22–5.81)
RDW: 11.7 % (ref 11.5–15.5)
WBC: 5.5 10*3/uL (ref 4.0–10.5)
nRBC: 0 % (ref 0.0–0.2)

## 2019-02-14 LAB — URINALYSIS, ROUTINE W REFLEX MICROSCOPIC
Bilirubin Urine: NEGATIVE
Glucose, UA: NEGATIVE mg/dL
Hgb urine dipstick: NEGATIVE
Ketones, ur: NEGATIVE mg/dL
Leukocytes,Ua: NEGATIVE
Nitrite: NEGATIVE
Protein, ur: NEGATIVE mg/dL
Specific Gravity, Urine: 1.003 — ABNORMAL LOW (ref 1.005–1.030)
pH: 6 (ref 5.0–8.0)

## 2019-02-14 LAB — SURGICAL PCR SCREEN
MRSA, PCR: NEGATIVE
Staphylococcus aureus: POSITIVE — AB

## 2019-02-14 LAB — PROTIME-INR
INR: 1 (ref 0.8–1.2)
Prothrombin Time: 13.5 seconds (ref 11.4–15.2)

## 2019-02-14 LAB — APTT: aPTT: 30 seconds (ref 24–36)

## 2019-02-14 NOTE — Pre-Procedure Instructions (Signed)
   Melvin Rich  02/14/2019     [id number] - [name], [name] - [address] [name] [address] Phone: [phone] Fax: [phone]   Your procedure is scheduled on Thursday, February 17, 2019  Report to [facility] Admitting at 10:30 A.M.  Call this number if you have problems the morning of surgery:  [phone]   Remember: Brush your teeth the morning of surgery with yor regular toothpaste  Do not eat after midnight.  You may drink clear liquids until 4:30 A.M. .  Clear liquids allowed are:  Water, Juice (non-citric and without pulp), Carbonated beverages, Clear Tea, Black Coffee only, Plain Jell-O only, Gatorade and Plain Popsicles only Please drink your Ensure Pre-Surgery clear carbohydrate drink by 4:30 A.M. the morning of surgery.   Take these medicines the morning of surgery with A SIP OF WATER: gabapentin (NEURONTIN) If needed: HYDROcodone-acetaminophen (NORCO) for pain, fluticasone (FLONASE) 50 MCG/ACT nasal spray  Stop taking Aspirin (unless otherwise advised by surgeon), vitamins, fish oil and herbal medications. Do not take any NSAIDs ie: Ibuprofen, Advil, Naproxen (Aleve), Motrin, BC and Goody Powder; stop now.    Do not wear jewelry, make-up or nail polish.  Do not wear lotions, powders, or perfumes, or deodorant.  Do not shave 48 hours prior to surgery.  Men may shave face and neck.  Do not bring valuables to the hospital.  [facility] is not responsible for any belongings or valuables.  Contacts, dentures or bridgework may not be worn into surgery. For patients admitted to the hospital, discharge time will be determined by your treatment team. Patients discharged the day of surgery will not be allowed to drive home.  Special instructions: See " [facility] Preparing for Surgery " sheet. Please read over the following fact sheets that you were given. Pain Booklet,  Coughing and Deep Breathing and Surgical Site Infection Prevention

## 2019-02-14 NOTE — Progress Notes (Signed)
Pt denies SOB, chest pain, and being under the care of a cardiologist. Pt denies having a stress test, echo and cardiac cath. Pt denies having an EKG and chest x ray within the last year. Pt denies recent labs.   Pt denies that he and S/O tested positive for COVID-19 ( pt reminded to quarantine after being tested today after pre-op appointment).  Pt denies that he and S/O experienced the following symptoms:  Cough yes/no: No Fever (>100.58F)  yes/no: No Runny nose yes/no: No Sore throat yes/no: No Difficulty breathing/shortness of breath  yes/no: No  Have you or a family member traveled in the last 14 days and where? yes/no: No  Pt reminded that hospital visitation restrictions are in effect and the importance of the restrictions.    Pt verbalized understanding of all pre-op instructions.  Pt chart forwarded to PA, Anesthesiology, to review EKG.

## 2019-02-15 ENCOUNTER — Encounter (HOSPITAL_COMMUNITY): Payer: Self-pay
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
## 2019-02-16 LAB — NOVEL CORONAVIRUS, NAA (HOSP ORDER, SEND-OUT TO REF LAB; TAT 18-24 HRS): SARS-CoV-2, NAA: NOT DETECTED

## 2019-02-17 ENCOUNTER — Inpatient Hospital Stay (HOSPITAL_COMMUNITY)
Admission: AD | Disposition: A | Discharge status: Home / Self Care | Payer: Self-pay | Source: Home / Self Care | Attending: Orthopedic Surgery

## 2019-02-17 ENCOUNTER — Other Ambulatory Visit: Payer: Self-pay

## 2019-02-17 ENCOUNTER — Ambulatory Visit (HOSPITAL_COMMUNITY): Payer: 59 | Admitting: Certified Registered"

## 2019-02-17 ENCOUNTER — Ambulatory Visit (HOSPITAL_COMMUNITY): Payer: 59

## 2019-02-17 ENCOUNTER — Inpatient Hospital Stay (HOSPITAL_COMMUNITY)
Admission: AD | Admit: 2019-02-17 | Discharge: 2019-02-18 | DRG: 473 | Disposition: A | Discharge status: Home / Self Care | Payer: 59 | Attending: Orthopedic Surgery | Admitting: Orthopedic Surgery

## 2019-02-17 ENCOUNTER — Encounter (HOSPITAL_COMMUNITY): Payer: Self-pay | Admitting: *Deleted

## 2019-02-17 ENCOUNTER — Ambulatory Visit (HOSPITAL_COMMUNITY): Payer: 59 | Admitting: Vascular Surgery

## 2019-02-17 DIAGNOSIS — M069 Rheumatoid arthritis, unspecified: Secondary | ICD-10-CM | POA: Diagnosis present

## 2019-02-17 DIAGNOSIS — Z1159 Encounter for screening for other viral diseases: Secondary | ICD-10-CM | POA: Diagnosis not present

## 2019-02-17 DIAGNOSIS — H8093 Unspecified otosclerosis, bilateral: Secondary | ICD-10-CM | POA: Diagnosis present

## 2019-02-17 DIAGNOSIS — Z79899 Other long term (current) drug therapy: Secondary | ICD-10-CM | POA: Diagnosis not present

## 2019-02-17 DIAGNOSIS — F1010 Alcohol abuse, uncomplicated: Secondary | ICD-10-CM | POA: Diagnosis present

## 2019-02-17 DIAGNOSIS — I10 Essential (primary) hypertension: Secondary | ICD-10-CM | POA: Diagnosis present

## 2019-02-17 DIAGNOSIS — M5410 Radiculopathy, site unspecified: Secondary | ICD-10-CM

## 2019-02-17 DIAGNOSIS — Z7951 Long term (current) use of inhaled steroids: Secondary | ICD-10-CM

## 2019-02-17 DIAGNOSIS — F1910 Other psychoactive substance abuse, uncomplicated: Secondary | ICD-10-CM | POA: Diagnosis present

## 2019-02-17 DIAGNOSIS — J302 Other seasonal allergic rhinitis: Secondary | ICD-10-CM | POA: Diagnosis present

## 2019-02-17 DIAGNOSIS — M5412 Radiculopathy, cervical region: Secondary | ICD-10-CM | POA: Diagnosis present

## 2019-02-17 DIAGNOSIS — F17210 Nicotine dependence, cigarettes, uncomplicated: Secondary | ICD-10-CM | POA: Diagnosis present

## 2019-02-17 DIAGNOSIS — M4802 Spinal stenosis, cervical region: Principal | ICD-10-CM | POA: Diagnosis present

## 2019-02-17 DIAGNOSIS — H9190 Unspecified hearing loss, unspecified ear: Secondary | ICD-10-CM | POA: Diagnosis present

## 2019-02-17 DIAGNOSIS — Z419 Encounter for procedure for purposes other than remedying health state, unspecified: Secondary | ICD-10-CM

## 2019-02-17 HISTORY — PX: ANTERIOR CERVICAL DECOMP/DISCECTOMY FUSION: SHX1161

## 2019-02-17 HISTORY — DX: Radiculopathy, site unspecified: M54.10

## 2019-02-17 SURGERY — ANTERIOR CERVICAL DECOMPRESSION/DISCECTOMY FUSION 2 LEVELS
Anesthesia: General | Site: Spine Cervical

## 2019-02-17 MED ORDER — FLEET ENEMA 7-19 GM/118ML RE ENEM: 1.0000 | ENEMA | Freq: Once | RECTAL | Status: DC | PRN

## 2019-02-17 MED ORDER — SODIUM CHLORIDE 0.9 % IV SOLN: 250.0000 mL | INTRAVENOUS | Status: DC

## 2019-02-17 MED ORDER — SODIUM CHLORIDE 0.9% FLUSH
3.0000 mL | Freq: Two times a day (BID) | INTRAVENOUS | Status: DC
  Administered 2019-02-17: 22:00:00 3 mL via INTRAVENOUS

## 2019-02-17 MED ORDER — ONDANSETRON HCL 4 MG PO TABS: 4.0000 mg | ORAL_TABLET | Freq: Four times a day (QID) | ORAL | Status: DC | PRN

## 2019-02-17 MED ORDER — DIAZEPAM 5 MG PO TABS: 5.0000 mg | ORAL_TABLET | Freq: Four times a day (QID) | ORAL | Status: DC | PRN

## 2019-02-17 MED ORDER — ROCURONIUM BROMIDE 10 MG/ML (PF) SYRINGE
PREFILLED_SYRINGE | INTRAVENOUS | Status: DC | PRN
  Administered 2019-02-17: 40 mg via INTRAVENOUS
  Administered 2019-02-17: 10 mg via INTRAVENOUS
  Administered 2019-02-17: 20 mg via INTRAVENOUS
  Administered 2019-02-17: 50 mg via INTRAVENOUS

## 2019-02-17 MED ORDER — POVIDONE-IODINE 7.5 % EX SOLN
Freq: Once | CUTANEOUS | Status: DC
  Filled 2019-02-17: qty 118

## 2019-02-17 MED ORDER — SUGAMMADEX SODIUM 200 MG/2ML IV SOLN
INTRAVENOUS | Status: DC | PRN
  Administered 2019-02-17: 140 mg via INTRAVENOUS

## 2019-02-17 MED ORDER — FENTANYL CITRATE (PF) 100 MCG/2ML IJ SOLN
INTRAMUSCULAR | Status: DC | PRN
  Administered 2019-02-17: 50 ug via INTRAVENOUS
  Administered 2019-02-17: 100 ug via INTRAVENOUS

## 2019-02-17 MED ORDER — IRBESARTAN 300 MG PO TABS
300.0000 mg | ORAL_TABLET | Freq: Every day | ORAL | Status: DC
  Administered 2019-02-17 – 2019-02-18 (×2): 300 mg via ORAL
  Filled 2019-02-17 (×2): qty 1

## 2019-02-17 MED ORDER — THROMBIN 20000 UNITS EX SOLR
CUTANEOUS | Status: DC | PRN
  Administered 2019-02-17: 20000 [IU] via TOPICAL

## 2019-02-17 MED ORDER — PANTOPRAZOLE SODIUM 40 MG IV SOLR
40.0000 mg | Freq: Every day | INTRAVENOUS | Status: DC
  Administered 2019-02-17: 22:00:00 40 mg via INTRAVENOUS
  Filled 2019-02-17: qty 40

## 2019-02-17 MED ORDER — SENNOSIDES-DOCUSATE SODIUM 8.6-50 MG PO TABS: 1.0000 | ORAL_TABLET | Freq: Every evening | ORAL | Status: DC | PRN

## 2019-02-17 MED ORDER — LIDOCAINE 2% (20 MG/ML) 5 ML SYRINGE
INTRAMUSCULAR | Status: DC | PRN
  Administered 2019-02-17: 100 mg via INTRAVENOUS

## 2019-02-17 MED ORDER — SODIUM CHLORIDE 0.9 % IV SOLN
INTRAVENOUS | Status: DC | PRN
  Administered 2019-02-17: 25 ug/min via INTRAVENOUS

## 2019-02-17 MED ORDER — ONDANSETRON HCL 4 MG/2ML IJ SOLN: 4.0000 mg | Freq: Four times a day (QID) | INTRAMUSCULAR | Status: DC | PRN

## 2019-02-17 MED ORDER — FLUTICASONE PROPIONATE 50 MCG/ACT NA SUSP: 1.0000 | Freq: Every day | NASAL | Status: DC | PRN

## 2019-02-17 MED ORDER — SUCCINYLCHOLINE CHLORIDE 200 MG/10ML IV SOSY
PREFILLED_SYRINGE | INTRAVENOUS | Status: AC
  Filled 2019-02-17: qty 10

## 2019-02-17 MED ORDER — ROCURONIUM BROMIDE 10 MG/ML (PF) SYRINGE
PREFILLED_SYRINGE | INTRAVENOUS | Status: AC
  Filled 2019-02-17: qty 10

## 2019-02-17 MED ORDER — LACTATED RINGERS IV SOLN
INTRAVENOUS | Status: DC
  Administered 2019-02-17: 15:00:00 via INTRAVENOUS
  Administered 2019-02-17: 1000 mL via INTRAVENOUS

## 2019-02-17 MED ORDER — THROMBIN (RECOMBINANT) 20000 UNITS EX SOLR
CUTANEOUS | Status: AC
  Filled 2019-02-17: qty 20000

## 2019-02-17 MED ORDER — ACETAMINOPHEN 650 MG RE SUPP: 650.0000 mg | RECTAL | Status: DC | PRN

## 2019-02-17 MED ORDER — ALUM & MAG HYDROXIDE-SIMETH 200-200-20 MG/5ML PO SUSP: 30.0000 mL | Freq: Four times a day (QID) | ORAL | Status: DC | PRN

## 2019-02-17 MED ORDER — DOCUSATE SODIUM 100 MG PO CAPS
100.0000 mg | ORAL_CAPSULE | Freq: Two times a day (BID) | ORAL | Status: DC
  Administered 2019-02-17 – 2019-02-18 (×2): 100 mg via ORAL
  Filled 2019-02-17 (×2): qty 1

## 2019-02-17 MED ORDER — PHENYLEPHRINE 40 MCG/ML (10ML) SYRINGE FOR IV PUSH (FOR BLOOD PRESSURE SUPPORT)
PREFILLED_SYRINGE | INTRAVENOUS | Status: AC
  Filled 2019-02-17: qty 20

## 2019-02-17 MED ORDER — SODIUM CHLORIDE 0.9% FLUSH: 3.0000 mL | INTRAVENOUS | Status: DC | PRN

## 2019-02-17 MED ORDER — SUCCINYLCHOLINE CHLORIDE 20 MG/ML IJ SOLN
INTRAMUSCULAR | Status: DC | PRN
  Administered 2019-02-17: 120 mg via INTRAVENOUS

## 2019-02-17 MED ORDER — MIDAZOLAM HCL 2 MG/2ML IJ SOLN
INTRAMUSCULAR | Status: AC
  Filled 2019-02-17: qty 2

## 2019-02-17 MED ORDER — MIDAZOLAM HCL 2 MG/2ML IJ SOLN
INTRAMUSCULAR | Status: DC | PRN
  Administered 2019-02-17: 2 mg via INTRAVENOUS

## 2019-02-17 MED ORDER — 0.9 % SODIUM CHLORIDE (POUR BTL) OPTIME
TOPICAL | Status: DC | PRN
  Administered 2019-02-17: 4000 mL

## 2019-02-17 MED ORDER — FENTANYL CITRATE (PF) 250 MCG/5ML IJ SOLN
INTRAMUSCULAR | Status: AC
  Filled 2019-02-17: qty 5

## 2019-02-17 MED ORDER — DEXAMETHASONE SODIUM PHOSPHATE 10 MG/ML IJ SOLN
INTRAMUSCULAR | Status: AC
  Filled 2019-02-17: qty 1

## 2019-02-17 MED ORDER — ONDANSETRON HCL 4 MG/2ML IJ SOLN
INTRAMUSCULAR | Status: DC | PRN
  Administered 2019-02-17: 4 mg via INTRAVENOUS

## 2019-02-17 MED ORDER — PHENYLEPHRINE 40 MCG/ML (10ML) SYRINGE FOR IV PUSH (FOR BLOOD PRESSURE SUPPORT)
PREFILLED_SYRINGE | INTRAVENOUS | Status: DC | PRN
  Administered 2019-02-17 (×2): 80 ug via INTRAVENOUS

## 2019-02-17 MED ORDER — PROPOFOL 10 MG/ML IV BOLUS
INTRAVENOUS | Status: DC | PRN
  Administered 2019-02-17: 160 mg via INTRAVENOUS

## 2019-02-17 MED ORDER — ACETAMINOPHEN 325 MG PO TABS: 650.0000 mg | ORAL_TABLET | ORAL | Status: DC | PRN

## 2019-02-17 MED ORDER — GABAPENTIN 400 MG PO CAPS
800.0000 mg | ORAL_CAPSULE | Freq: Two times a day (BID) | ORAL | Status: DC
  Administered 2019-02-17 – 2019-02-18 (×2): 800 mg via ORAL
  Filled 2019-02-17 (×2): qty 2

## 2019-02-17 MED ORDER — BUPIVACAINE-EPINEPHRINE 0.25% -1:200000 IJ SOLN
INTRAMUSCULAR | Status: DC | PRN
  Administered 2019-02-17: 4 mL

## 2019-02-17 MED ORDER — ONDANSETRON HCL 4 MG/2ML IJ SOLN
INTRAMUSCULAR | Status: AC
  Filled 2019-02-17: qty 2

## 2019-02-17 MED ORDER — PROPOFOL 10 MG/ML IV BOLUS
INTRAVENOUS | Status: AC
  Filled 2019-02-17: qty 20

## 2019-02-17 MED ORDER — PROMETHAZINE HCL 25 MG/ML IJ SOLN: 6.2500 mg | INTRAMUSCULAR | Status: DC | PRN

## 2019-02-17 MED ORDER — CEFAZOLIN SODIUM-DEXTROSE 2-4 GM/100ML-% IV SOLN
2.0000 g | INTRAVENOUS | Status: AC
  Administered 2019-02-17: 2 g via INTRAVENOUS

## 2019-02-17 MED ORDER — CEFAZOLIN SODIUM-DEXTROSE 2-4 GM/100ML-% IV SOLN
INTRAVENOUS | Status: AC
  Filled 2019-02-17: qty 100

## 2019-02-17 MED ORDER — BUPIVACAINE-EPINEPHRINE (PF) 0.25% -1:200000 IJ SOLN
INTRAMUSCULAR | Status: AC
  Filled 2019-02-17: qty 30

## 2019-02-17 MED ORDER — ZOLPIDEM TARTRATE 5 MG PO TABS
5.0000 mg | ORAL_TABLET | Freq: Every evening | ORAL | Status: DC | PRN
  Administered 2019-02-17: 22:00:00 5 mg via ORAL
  Filled 2019-02-17: qty 1

## 2019-02-17 MED ORDER — BISACODYL 5 MG PO TBEC: 5.0000 mg | DELAYED_RELEASE_TABLET | Freq: Every day | ORAL | Status: DC | PRN

## 2019-02-17 MED ORDER — FENTANYL CITRATE (PF) 100 MCG/2ML IJ SOLN: 25.0000 ug | INTRAMUSCULAR | Status: DC | PRN

## 2019-02-17 MED ORDER — MENTHOL 3 MG MT LOZG: 1.0000 | LOZENGE | OROMUCOSAL | Status: DC | PRN

## 2019-02-17 MED ORDER — OXYCODONE-ACETAMINOPHEN 5-325 MG PO TABS
1.0000 | ORAL_TABLET | ORAL | Status: DC | PRN
  Administered 2019-02-17 – 2019-02-18 (×3): 2 via ORAL
  Filled 2019-02-17 (×3): qty 2

## 2019-02-17 MED ORDER — DEXAMETHASONE SODIUM PHOSPHATE 10 MG/ML IJ SOLN
INTRAMUSCULAR | Status: DC | PRN
  Administered 2019-02-17: 8 mg via INTRAVENOUS

## 2019-02-17 MED ORDER — PHENOL 1.4 % MT LIQD: 1.0000 | OROMUCOSAL | Status: DC | PRN

## 2019-02-17 MED ORDER — CEFAZOLIN SODIUM-DEXTROSE 2-4 GM/100ML-% IV SOLN
2.0000 g | Freq: Three times a day (TID) | INTRAVENOUS | Status: AC
  Administered 2019-02-17 – 2019-02-18 (×2): 2 g via INTRAVENOUS
  Filled 2019-02-17 (×2): qty 100

## 2019-02-17 SURGICAL SUPPLY — 77 items
BENZOIN TINCTURE PRP APPL 2/3 (GAUZE/BANDAGES/DRESSINGS) ×2 IMPLANT
BIT DRILL NEURO 2X3.1 SFT TUCH (MISCELLANEOUS) ×1 IMPLANT
BIT DRILL SRG 14X2.2XFLT CHK (BIT) ×1 IMPLANT
BIT DRL SRG 14X2.2XFLT CHK (BIT) ×1
BLADE CLIPPER SURG (BLADE) ×2 IMPLANT
BLADE SURG 15 STRL LF DISP TIS (BLADE) ×1 IMPLANT
BLADE SURG 15 STRL SS (BLADE) ×1
BONE VIVIGEN FORMABLE 1.3CC (Bone Implant) ×4 IMPLANT
BUR MATCHSTICK NEURO 3.0 LAGG (BURR) IMPLANT
CARTRIDGE OIL MAESTRO DRILL (MISCELLANEOUS) ×1 IMPLANT
CLSR STERI-STRIP ANTIMIC 1/2X4 (GAUZE/BANDAGES/DRESSINGS) ×2 IMPLANT
COLLAR CERV LO CONTOUR FIRM DE (SOFTGOODS) IMPLANT
CORDS BIPOLAR (ELECTRODE) ×2 IMPLANT
COVER SURGICAL LIGHT HANDLE (MISCELLANEOUS) ×2 IMPLANT
COVER WAND RF STERILE (DRAPES) ×2 IMPLANT
CRADLE DONUT ADULT HEAD (MISCELLANEOUS) ×2 IMPLANT
DECANTER SPIKE VIAL GLASS SM (MISCELLANEOUS) ×2 IMPLANT
DEVICE ENDSKLTN IMPLNT MED 6MM (Orthopedic Implant) ×1 IMPLANT
DIFFUSER DRILL AIR PNEUMATIC (MISCELLANEOUS) ×2 IMPLANT
DRAIN JACKSON RD 7FR 3/32 (WOUND CARE) IMPLANT
DRAPE C-ARM 42X72 X-RAY (DRAPES) ×2 IMPLANT
DRAPE POUCH INSTRU U-SHP 10X18 (DRAPES) ×2 IMPLANT
DRAPE SURG 17X23 STRL (DRAPES) ×8 IMPLANT
DRILL BIT SKYLINE 14MM (BIT) ×1
DRILL NEURO 2X3.1 SOFT TOUCH (MISCELLANEOUS) ×2
DURAPREP 26ML APPLICATOR (WOUND CARE) ×2 IMPLANT
ELECT COATED BLADE 2.86 ST (ELECTRODE) ×2 IMPLANT
ELECT REM PT RETURN 9FT ADLT (ELECTROSURGICAL) ×2
ELECTRODE REM PT RTRN 9FT ADLT (ELECTROSURGICAL) ×1 IMPLANT
ENDOSKELETON IMPLANT MED 6MM (Orthopedic Implant) ×2 IMPLANT
EVACUATOR SILICONE 100CC (DRAIN) IMPLANT
GAUZE 4X4 16PLY RFD (DISPOSABLE) ×2 IMPLANT
GAUZE SPONGE 4X4 12PLY STRL (GAUZE/BANDAGES/DRESSINGS) ×2 IMPLANT
GLOVE BIO SURGEON STRL SZ7 (GLOVE) ×2 IMPLANT
GLOVE BIO SURGEON STRL SZ8 (GLOVE) ×2 IMPLANT
GLOVE BIOGEL PI IND STRL 7.0 (GLOVE) ×2 IMPLANT
GLOVE BIOGEL PI IND STRL 8 (GLOVE) ×1 IMPLANT
GLOVE BIOGEL PI INDICATOR 7.0 (GLOVE) ×2
GLOVE BIOGEL PI INDICATOR 8 (GLOVE) ×1
GOWN STRL REUS W/ TWL LRG LVL3 (GOWN DISPOSABLE) ×1 IMPLANT
GOWN STRL REUS W/ TWL XL LVL3 (GOWN DISPOSABLE) ×1 IMPLANT
GOWN STRL REUS W/TWL LRG LVL3 (GOWN DISPOSABLE) ×1
GOWN STRL REUS W/TWL XL LVL3 (GOWN DISPOSABLE) ×1
INTERLOCK LRDTC CRVCL VBR 6MM (Bone Implant) ×1 IMPLANT
IV CATH 14GX2 1/4 (CATHETERS) ×2 IMPLANT
KIT BASIN OR (CUSTOM PROCEDURE TRAY) ×2 IMPLANT
KIT TURNOVER KIT B (KITS) ×2 IMPLANT
LORDOTIC CERVICAL VBR 6MM SM (Bone Implant) ×2 IMPLANT
MANIFOLD NEPTUNE II (INSTRUMENTS) ×2 IMPLANT
NEEDLE PRECISIONGLIDE 27X1.5 (NEEDLE) ×2 IMPLANT
NEEDLE SPNL 20GX3.5 QUINCKE YW (NEEDLE) ×2 IMPLANT
NS IRRIG 1000ML POUR BTL (IV SOLUTION) ×2 IMPLANT
OIL CARTRIDGE MAESTRO DRILL (MISCELLANEOUS) ×2
PACK ORTHO CERVICAL (CUSTOM PROCEDURE TRAY) ×2 IMPLANT
PAD ARMBOARD 7.5X6 YLW CONV (MISCELLANEOUS) ×4 IMPLANT
PATTIES SURGICAL .5 X.5 (GAUZE/BANDAGES/DRESSINGS) IMPLANT
PATTIES SURGICAL .5 X1 (DISPOSABLE) ×2 IMPLANT
PIN DISTRACTION 14 (PIN) ×4 IMPLANT
PLATE SKYLINE TWO LEVEL 28MM (Plate) ×2 IMPLANT
SCREW SKYLINE VAR OS 14MM (Screw) ×12 IMPLANT
SPONGE INTESTINAL PEANUT (DISPOSABLE) ×2 IMPLANT
SPONGE SURGIFOAM ABS GEL 100 (HEMOSTASIS) ×2 IMPLANT
STRIP CLOSURE SKIN 1/2X4 (GAUZE/BANDAGES/DRESSINGS) ×2 IMPLANT
SURGIFLO W/THROMBIN 8M KIT (HEMOSTASIS) IMPLANT
SUT MNCRL AB 4-0 PS2 18 (SUTURE) ×2 IMPLANT
SUT SILK 4 0 (SUTURE)
SUT SILK 4-0 18XBRD TIE 12 (SUTURE) IMPLANT
SUT VIC AB 2-0 CT2 18 VCP726D (SUTURE) ×2 IMPLANT
SYR BULB IRRIGATION 50ML (SYRINGE) ×2 IMPLANT
SYR CONTROL 10ML LL (SYRINGE) ×6 IMPLANT
TAPE CLOTH 4X10 WHT NS (GAUZE/BANDAGES/DRESSINGS) ×2 IMPLANT
TAPE CLOTH SURG 4X10 WHT LF (GAUZE/BANDAGES/DRESSINGS) ×2 IMPLANT
TAPE UMBILICAL COTTON 1/8X30 (MISCELLANEOUS) ×2 IMPLANT
TOWEL GREEN STERILE FF (TOWEL DISPOSABLE) ×2 IMPLANT
TOWEL OR 17X26 10 PK STRL BLUE (TOWEL DISPOSABLE) ×2 IMPLANT
WATER STERILE IRR 1000ML POUR (IV SOLUTION) ×2 IMPLANT
YANKAUER SUCT BULB TIP NO VENT (SUCTIONS) ×2 IMPLANT

## 2019-02-17 NOTE — H&P (Signed)
PREOPERATIVE H&P  Chief Complaint: Left arm pain  HPI: Melvin Rich is a 58 y.o. male who presents with ongoing pain in the left arm  MRI reveals NF stenosis spanning C5-C7  Patient has failed multiple forms of conservative care and continues to have pain (see office notes for additional details regarding the patient's full course of treatment)  Past Medical History:  Diagnosis Date  . Alcohol use disorder   . Chronic active hepatitis (HCC) 04/23/2017   Pt stated that he had the treatment  . Chronic hepatitis C virus infection (HCC) 04/23/2017  . Degenerative disc disease, cervical   . Degenerative disc disease, cervical   . History of drug use disorder    Heroin, IVDA  . 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 Hypertension   . Left arm pain   . 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 Rheumatoid arthritis (HCC)   . Seasonal allergies   . Substance abuse (HCC)   . Transaminitis 04/23/2017   Past Surgical History:  Procedure Laterality Date  . HIP SURGERY  2005  . SHOULDER SURGERY  1985   Social History   Socioeconomic History  . Marital status: Married    Spouse name: Not on file  . Number of children: Not on file  . Years of education: Not on file  . Highest education level: Not on file  Occupational History  . Not on file  Social Needs  . Financial resource strain: Not on file  . Food insecurity    Worry: Not on file    Inability: Not on file  . Transportation needs    Medical: Not on file    Non-medical: Not on file  Tobacco Use  . Smoking status: Current Every Day Smoker    Packs/day: 0.50    Years: 47.00    Pack years: 23.50    Types: Cigarettes  . Smokeless tobacco: Never Used  Substance and Sexual Activity  . Alcohol use: Yes    Alcohol/week: 4.0 standard drinks    Types: 4 Cans of beer per week    Comment: daily beer  . Drug use: No    Comment: " clean since 2004 "  . Sexual activity: Yes  Lifestyle  . Physical activity   Days per week: Not on file    Minutes per session: Not on file  . Stress: Not on file  Relationships  . Social Musician on phone: Not on file    Gets together: Not on file    Attends religious service: Not on file    Active member of club or organization: Not on file    Attends meetings of clubs or organizations: Not on file    Relationship status: Not on file  Other Topics Concern  . Not on file  Social History Narrative  . Not on file   Family History  Adopted: Yes  Family history unknown: Yes   No Known Allergies Prior to Admission medications   Medication Sig Start Date End Date Taking? Authorizing Provider  fluticasone (FLONASE) 50 MCG/ACT nasal spray Place 1 spray into both nostrils daily. Patient taking differently: Place 1 spray into both nostrils daily as needed for allergies.  02/23/18  Yes Carlis Stable, PA-C  gabapentin (NEURONTIN) 800 MG tablet Take 1-2 tablets (800-1,600 mg total) by mouth 2 (two) times daily. Patient taking differently: Take 1,600 mg by mouth 2 (two) times daily.  12/14/18  Yes  Silverio Decamp, MD  HYDROcodone-acetaminophen [facility]) 10-325 MG tablet Take 1 tablet by mouth every 8 (eight) hours as needed. 01/19/19  Yes Silverio Decamp, MD  Multiple Vitamins-Minerals (CENTRUM ADULTS PO) Take 1 tablet by mouth daily.    Yes [provider]  valsartan (DIOVAN) 320 MG tablet Take 1 tablet (320 mg total) by mouth daily. 09/29/18  Yes Trixie Dredge, PA-C  cetirizine-pseudoephedrine (ZYRTEC-D) 5-120 MG tablet Take 1 tablet by mouth 2 (two) times daily.    [provider]  sildenafil (VIAGRA) 100 MG tablet Take 1 tablet (100 mg total) by mouth as needed for erectile dysfunction (for use prior to sexual activity). 07/20/18   Trixie Dredge, PA-C     All other systems have been reviewed and were otherwise negative with the exception of those mentioned in the HPI and as above.   Physical Exam: There were no vitals filed for this visit.  There is no height or weight on file to calculate BMI.  General: Alert, no acute distress Cardiovascular: No pedal edema Respiratory: No cyanosis, no use of accessory musculature Skin: No lesions in the area of chief complaint Neurologic: Sensation intact distally Psychiatric: Patient is competent for consent with normal mood and affect Lymphatic: No axillary or cervical lymphadenopathy  MUSCULOSKELETAL: + spurling sign on the left  Assessment/Plan: LEFT ARM PAIN Plan for Procedure(s): ANTERIOR CERVICAL DECOMPRESSION FUSION, CERVICAL 5-6, CERVICAL 6-7 WITH INSTRUMENTATION AND ALLOGRAFT   Norva Karvonen, MD 02/17/2019 7:17 AM

## 2019-02-17 NOTE — Anesthesia Procedure Notes (Signed)
Procedure Name: Intubation Date/Time: 02/17/2019 1:57 PM Performed by: Barrington Ellison, CRNA Pre-anesthesia Checklist: Patient identified, Emergency Drugs available, Suction available and Patient being monitored Patient Re-evaluated:Patient Re-evaluated prior to induction Oxygen Delivery Method: Circle System Utilized Preoxygenation: Pre-oxygenation with 100% oxygen Induction Type: IV induction Ventilation: Mask ventilation without difficulty Laryngoscope Size: Glidescope and 4 Grade View: Grade I Tube type: Oral Tube size: 7.5 mm Number of attempts: 1 Airway Equipment and Method: Stylet and Oral airway Placement Confirmation: ETT inserted through vocal cords under direct vision,  positive ETCO2 and breath sounds checked- equal and bilateral Secured at: 22 cm Tube secured with: Tape Dental Injury: Teeth and Oropharynx as per pre-operative assessment  Difficulty Due To: Difficulty was anticipated, Difficult Airway- due to reduced neck mobility and Difficult Airway-  due to neck instability

## 2019-02-17 NOTE — Op Note (Signed)
PATIENT NAME: Melvin Rich   MEDICAL RECORD NO.:   [id number]    PHYSICIAN:  Phylliss Bob, MD      DATE OF BIRTH: 04-11-1961  ASSISTANT: Pricilla Holm, PA-C   DATE OF PROCEDURE: 02/17/2019                               OPERATIVE REPORT     PREOPERATIVE DIAGNOSES: 1. Left > right cervical radiculopathy. 2. Spinal stenosis spanning C5-C7.   POSTOPERATIVE DIAGNOSES: 1. Left > right cervical radiculopathy. 2. Spinal stenosis spanning C5-C7.   PROCEDURE: 1. Anterior cervical decompression and fusion C5/6, C6/7. 2. Placement of anterior instrumentation, C5-C7. 3. Insertion of interbody device x 2 (Titan intervertebral spacers). 4. Intraoperative use of fluoroscopy. 5. Use of morselized allograft   SURGEON:  Phylliss Bob, MD   ASSISTANT:  Pricilla Holm, PA-C.   ANESTHESIA:  General endotracheal anesthesia.   COMPLICATIONS:  None.   DISPOSITION:  Stable.   ESTIMATED BLOOD LOSS:  Minimal.   INDICATIONS FOR SURGERY:  Briefly, Mr. Granquist is a pleasant 58 y.o. year- old male, who did present to me with severe pain in the neck and bilateral arms.  The patient's MRI did reveal the findings noted above.  Given the patient's ongoing rather debilitating pain and lack of improvement with appropriate treatment measures, we did discuss proceeding with the procedure noted above.  The patient was fully aware of the risks and limitations of surgery as outlined in my preoperative note.   OPERATIVE DETAILS:  On 02/17/2019  the patient was brought to surgery and general endotracheal anesthesia was administered.  The patient was placed supine on the hospital bed. The neck was gently extended.  All bony prominences were meticulously padded.  The neck was prepped and draped in the usual sterile fashion.  At this point, I did make a left-sided transverse incision.  The platysma was incised.  A Smith-Robinson approach was used and the anterior spine was identified. A  self-retaining retractor was placed.  I then subperiosteally exposed the vertebral bodies from C5-C7.  Caspar pins were then placed into the C6 and C7 vertebral bodies and distraction was applied.  A thorough and complete C6-7 intervertebral diskectomy was performed.  The posterior longitudinal ligament was identified and entered using a nerve hook.  I then used #1 followed by #2 Kerrison to perform a thorough and complete intervertebral diskectomy.  The spinal canal was thoroughly decompressed, as was the right and left neuroforamena.  The endplates were then prepared and the appropriate-sized intervertebral spacer was then packed with allograft and tamped into position in the usual fashion.  The lower Caspar pin was then removed and placed into the C5 vertebral body and once again, distraction was applied across the C5-6 intervertebral space.  I then again performed a thorough and complete diskectomy, thoroughly decompressing the spinal canal and bilateral neuroforamena.  After preparing the endplates, the appropriate-sized intervertebral spacer was packed with allograft and tamped into position. The Caspar pins then were removed and bone wax was placed in their place.  The appropriate-sized anterior cervical plate was placed over the anterior spine.  14 mm variable angle screws were placed, 2 in each vertebral body from C5-C7 for a total of 6 vertebral body screws.  The screws were then locked to the plate using the Cam locking mechanism.  I was very pleased with the final fluoroscopic images.  The wound was then irrigated.  The  wound was then explored for any undue bleeding and there was no bleeding noted. The wound was then closed in layers using 2-0 Vicryl, followed by 4-0 Monocryl.  Benzoin and Steri-Strips were applied, followed by sterile dressing.  All instrument counts were correct at the termination of the procedure.   Of note, Jason Coop, PA-C, was my assistant throughout  surgery, and did aid in retraction, suctioning, and closure from start to finish.         Estill Bamberg, MD

## 2019-02-17 NOTE — Transfer of Care (Signed)
Immediate Anesthesia Transfer of Care Note  Patient: Melvin Rich  Procedure(s) Performed: ANTERIOR CERVICAL DECOMPRESSION FUSION, CERVICAL 5-6, CERVICAL 6-7 WITH INSTRUMENTATION AND ALLOGRAFT (N/A Spine Cervical)  Patient Location: PACU  Anesthesia Type:General  Level of Consciousness: awake and oriented  Airway & Oxygen Therapy: Patient Spontanous Breathing and Patient connected to face mask oxygen  Post-op Assessment: Report given to RN and Patient moving all extremities X 4  Post vital signs: Reviewed and stable  Last Vitals:  Vitals Value Taken Time  BP 122/79 02/17/19 1648  Temp    Pulse 86 02/17/19 1649  Resp 9 02/17/19 1649  SpO2 100 % 02/17/19 1649  Vitals shown include unvalidated device data.  Last Pain:  Vitals:   02/17/19 1104  TempSrc: Oral  PainSc: 3       Patients Stated Pain Goal: 4 (91/91/66 0600)  Complications: No apparent anesthesia complications

## 2019-02-17 NOTE — Anesthesia Preprocedure Evaluation (Addendum)
Anesthesia Evaluation  Patient identified by MRN, date of birth, ID band Patient awake    Reviewed: Allergy & Precautions, NPO status , Patient's Chart, lab work & pertinent test results  Airway Mallampati: II  TM Distance: >3 FB Neck ROM: Limited    Dental  (+) Teeth Intact, Poor Dentition, Chipped, Dental Advisory Given   Pulmonary Current Smoker,    breath sounds clear to auscultation       Cardiovascular hypertension,  Rhythm:Regular Rate:Normal     Neuro/Psych    GI/Hepatic negative GI ROS, (+) Hepatitis -  Endo/Other    Renal/GU negative Renal ROS     Musculoskeletal   Abdominal   Peds  Hematology   Anesthesia Other Findings   Reproductive/Obstetrics                            Anesthesia Physical Anesthesia Plan  ASA: III  Anesthesia Plan: General   Post-op Pain Management:    Induction: Intravenous  PONV Risk Score and Plan: 2  Airway Management Planned: Oral ETT  Additional Equipment:   Intra-op Plan:   Post-operative Plan: Extubation in OR  Informed Consent: I have reviewed the patients History and Physical, chart, labs and discussed the procedure including the risks, benefits and alternatives for the proposed anesthesia with the patient or authorized representative who has indicated his/her understanding and acceptance.     Dental advisory given  Plan Discussed with: Anesthesiologist and CRNA  Anesthesia Plan Comments:         Anesthesia Quick Evaluation

## 2019-02-17 NOTE — Anesthesia Postprocedure Evaluation (Signed)
Anesthesia Post Note  Patient: Melvin Rich  Procedure(s) Performed: ANTERIOR CERVICAL DECOMPRESSION FUSION, CERVICAL 5-6, CERVICAL 6-7 WITH INSTRUMENTATION AND ALLOGRAFT (N/A Spine Cervical)     Patient location during evaluation: PACU Anesthesia Type: General Level of consciousness: awake Pain management: pain level controlled Vital Signs Assessment: post-procedure vital signs reviewed and stable Respiratory status: spontaneous breathing Cardiovascular status: stable Postop Assessment: no apparent nausea or vomiting Anesthetic complications: no    Last Vitals:  Vitals:   02/17/19 1733 02/17/19 1801  BP: 134/88   Pulse: 81   Resp: 12   Temp:  37 C  SpO2: 95%     Last Pain:  Vitals:   02/17/19 1813  TempSrc:   PainSc: 8                  Shakena Callari

## 2019-02-18 ENCOUNTER — Encounter (HOSPITAL_COMMUNITY): Payer: Self-pay | Admitting: Orthopedic Surgery

## 2019-02-18 ENCOUNTER — Encounter: Payer: Self-pay | Admitting: Physician Assistant

## 2019-02-18 MED ORDER — OXYCODONE-ACETAMINOPHEN 5-325 MG PO TABS: 1.0000 | ORAL_TABLET | ORAL | 0 refills | Status: DC | PRN

## 2019-02-18 MED ORDER — DIAZEPAM 5 MG PO TABS: 5.0000 mg | ORAL_TABLET | Freq: Four times a day (QID) | ORAL | 0 refills | Status: DC | PRN

## 2019-02-18 MED FILL — Thrombin (Recombinant) For Soln 20000 Unit: CUTANEOUS | Qty: 1 | Status: AC

## 2019-02-18 NOTE — Progress Notes (Signed)
    Patient doing well  Denies right arm pain, does report residual tingling in left arm into left hand Tolerating PO well   Physical Exam: Vitals:   02/17/19 2350 02/18/19 0412  BP: 138/90 (!) 152/103  Pulse: 68 66  Resp: 17   Temp: 98.4 F (36.9 C) 98.4 F (36.9 C)  SpO2: 100% 100%    Neck soft/supple Dressing in place NVI  POD #1 s/p ACDF, doing well  - encourage ambulation - Percocet for pain, Valium for muscle spasms - d/c home today with f/u in 2 weeks - will follow left arm symptoms, as they should improve in time as nerves have been thoroughly decompressed

## 2019-02-18 NOTE — Progress Notes (Signed)
Discharge instructions given. Pt verbalized understanding and all questions were answered.  

## 2019-02-21 NOTE — Telephone Encounter (Signed)
Note sent to Jessica to schedule 

## 2019-02-22 ENCOUNTER — Encounter: Payer: Self-pay | Admitting: Physician Assistant

## 2019-02-22 ENCOUNTER — Ambulatory Visit (INDEPENDENT_AMBULATORY_CARE_PROVIDER_SITE_OTHER): Payer: 59 | Admitting: Physician Assistant

## 2019-02-22 ENCOUNTER — Ambulatory Visit: Payer: 59 | Admitting: Physician Assistant

## 2019-02-22 DIAGNOSIS — Z981 Arthrodesis status: Secondary | ICD-10-CM | POA: Insufficient documentation

## 2019-02-22 DIAGNOSIS — G479 Sleep disorder, unspecified: Secondary | ICD-10-CM

## 2019-02-22 DIAGNOSIS — M5030 Other cervical disc degeneration, unspecified cervical region: Secondary | ICD-10-CM

## 2019-02-22 DIAGNOSIS — R454 Irritability and anger: Secondary | ICD-10-CM | POA: Insufficient documentation

## 2019-02-22 DIAGNOSIS — F1111 Opioid abuse, in remission: Secondary | ICD-10-CM

## 2019-02-22 DIAGNOSIS — F17210 Nicotine dependence, cigarettes, uncomplicated: Secondary | ICD-10-CM | POA: Diagnosis not present

## 2019-02-22 DIAGNOSIS — G8929 Other chronic pain: Secondary | ICD-10-CM | POA: Insufficient documentation

## 2019-02-22 DIAGNOSIS — M542 Cervicalgia: Secondary | ICD-10-CM

## 2019-02-22 MED ORDER — GABAPENTIN 600 MG PO TABS: ORAL_TABLET | ORAL | 1 refills | Status: DC

## 2019-02-22 MED ORDER — TEMAZEPAM 7.5 MG PO CAPS: 7.5000 mg | ORAL_CAPSULE | Freq: Every evening | ORAL | 0 refills | Status: DC | PRN

## 2019-02-22 NOTE — Progress Notes (Signed)
Virtual Visit via Video Note  I connected with Melvin Rich on 02/22/19 at  2:40 PM EDT by a video enabled telemedicine application and verified that I am speaking with the correct person using two identifiers.   I discussed the limitations of evaluation and management by telemedicine and the availability of in person appointments. The patient expressed understanding and agreed to proceed.  History of Present Illness: HPI:                                                                Melvin Rich is a 58 y.o. male   CC: smoking cessation/sleep disturbance  Recently underwent cervical discectomy and fusion with Dr. Marissa Nestle on 02/17/19. He states his neck pain has improved compared to pre-op. It was recommended that he quit smoking, but he doesn't feel he smokes very much and he does not think now is a good time to quit.   Reports he has been feeling very irritable and short-fused. Reports neck discomfort and wearing hard cervical collar makes it very hard to fall asleep and he has frequent nighttime awakening. He is currently taking Percocet 5-325 mg, 3-4 tablets per day and Gabapentin 1600 mg twice a day. He doesn't feel Gabapentin is helpful for pain at all He took Valium 10 mg last night and reports it was not helpful for sleep.  He is not combining valium with Percocet. Usually takes last dose of Percocet in the early afternoon.    Currently smokes 1/2 ppd. Does not vape or use or other nicotine products. Quit many years back, cold [place] while he was in prison. The longest he went without smoking was about 2 years. He would like to know more about Chantix as an option in the future, but he does not feel ready to quit right now.   Depression screen [facility] 2/9 07/20/2018 07/20/2018 12/23/2017 05/07/2017 05/01/2017  Decreased Interest 0 0 0 0 0  Down, Depressed, Hopeless 0 0 0 0 0  PHQ - 2 Score 0 0 0 0 0  Altered sleeping - - 1 - -  Tired, decreased energy - - 0 - -  Change  in appetite - - 0 - -  Feeling bad or failure about yourself  - - 0 - -  Trouble concentrating - - 0 - -  Moving slowly or fidgety/restless - - 0 - -  Suicidal thoughts - - 0 - -  PHQ-9 Score - - 1 - -    No flowsheet data found.    Past Medical History:  Diagnosis Date  . Alcohol use disorder   . Chronic active hepatitis (HCC) 04/23/2017   Pt stated that he had the treatment  . Chronic hepatitis C virus infection (HCC) 04/23/2017  . Degenerative disc disease, cervical   . Degenerative disc disease, cervical   . History of drug use disorder    Heroin, IVDA  . 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 Hypertension   . Left arm pain   . 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 Rheumatoid arthritis (HCC)   . Seasonal allergies   . Substance abuse (HCC)   . Transaminitis 04/23/2017   Past Surgical History:  Procedure Laterality Date  . ANTERIOR CERVICAL DECOMP/DISCECTOMY FUSION N/A  02/17/2019   Procedure: ANTERIOR CERVICAL DECOMPRESSION FUSION, CERVICAL 5-6, CERVICAL 6-7 WITH INSTRUMENTATION AND ALLOGRAFT;  Surgeon: Phylliss Bob, MD;  Location: [facility];  Service: Orthopedics;  Laterality: N/A;  . HIP SURGERY  2005  . SHOULDER SURGERY  1985   Social History   Tobacco Use  . Smoking status: Current Every Day Smoker    Packs/day: 0.50    Years: 47.00    Pack years: 23.50    Types: Cigarettes  . Smokeless tobacco: Never Used  Substance Use Topics  . Alcohol use: Yes    Alcohol/week: 4.0 standard drinks    Types: 4 Cans of beer per week    Comment: daily beer   He was adopted. Family history is unknown by patient.    ROS: negative except as noted in the HPI  Medications: Current Outpatient Medications  Medication Sig Dispense Refill  . cetirizine-pseudoephedrine (ZYRTEC-D) 5-120 MG tablet Take 1 tablet by mouth 2 (two) times daily.    . fluticasone (FLONASE) 50 MCG/ACT nasal spray Place 1 spray into both nostrils daily. (Patient taking differently: Place 1 spray into  both nostrils daily as needed for allergies. ) 16 g 3  . gabapentin (NEURONTIN) 600 MG tablet Day 1-4: 2 tabs QAM, 1 tab afternoon, and 2 tabs QHS. Day 5-8: 2 tabs twice a day. Day 9-12: take 1 tab QAM, 1 tab afternoon, and 1 tab QHS; Day 13-16: 1 tab twice a day 60 tablet 1  . Multiple Vitamins-Minerals (CENTRUM ADULTS PO) Take 1 tablet by mouth daily.     Marland Kitchen oxyCODONE-acetaminophen (PERCOCET/ROXICET) 5-325 MG tablet Take 1-2 tablets by mouth every 4 (four) hours as needed for moderate pain or severe pain. 30 tablet 0  . sildenafil (VIAGRA) 100 MG tablet Take 1 tablet (100 mg total) by mouth as needed for erectile dysfunction (for use prior to sexual activity). 30 tablet 11  . valsartan (DIOVAN) 320 MG tablet Take 1 tablet (320 mg total) by mouth daily. 90 tablet 1  . temazepam (RESTORIL) 7.5 MG capsule Take 1-2 capsules (7.5-15 mg total) by mouth at bedtime as needed for sleep. 30 capsule 0   No current facility-administered medications for this visit.    No Known Allergies     Objective:  There were no vitals taken for this visit. Gen:  alert, not ill-appearing, no distress, appropriate for age 58: head normocephalic without obvious abnormality, conjunctiva and cornea clear, trachea midline Pulm: Normal work of breathing, normal phonation Neuro: alert and oriented x 3 Psych: cooperative, "irritable" mood, speech is articulate, normal rate and volume; thought processes clear and goal-directed, normal judgment, good insight, denies SI/HI   No results found for this or any previous visit (from the past 72 hour(s)). No results found.    Assessment and Plan: 58 y.o. male with   .Diagnoses and all orders for this visit:  Cigarette nicotine dependence without complication  Irritability and anger  Sleep disturbance -     temazepam (RESTORIL) 7.5 MG capsule; Take 1-2 capsules (7.5-15 mg total) by mouth at bedtime as needed for sleep.  Status post cervical spinal fusion  Chronic  neck pain -     gabapentin (NEURONTIN) 600 MG tablet; Day 1-4: 2 tabs QAM, 1 tab afternoon, and 2 tabs QHS. Day 5-8: 2 tabs twice a day. Day 9-12: take 1 tab QAM, 1 tab afternoon, and 1 tab QHS; Day 13-16: 1 tab twice a day  DDD (degenerative disc disease), cervical -     gabapentin (NEURONTIN)  600 MG tablet; Day 1-4: 2 tabs QAM, 1 tab afternoon, and 2 tabs QHS. Day 5-8: 2 tabs twice a day. Day 9-12: take 1 tab QAM, 1 tab afternoon, and 1 tab QHS; Day 13-16: 1 tab twice a day  History of opioid abuse (HCC)     Patient counseled on smoking cessation today.  He declined a prescription for Chantix at this time.  He is not ready to quit.  He would like to taper off of gabapentin because he does not feel it is helping his neck pain.  New prescription sent for gabapentin 600 mg tablets with taper instructions.  Patient was advised to gradually decrease his dose every 4 days.  Taper instructions were also sent via my chart.  He was advised that he may experience increased withdrawal symptoms despite the taper including some of the symptoms he is already struggling with such as agitation, irritability, sleep disturbance.  Adding Restoril at bedtime for sleep disturbance.  Start with 7-1/2 mg for 3 nights and may increase to max dose of 15 mg nightly.  He was advised to discontinue Valium.  He was counseled not to combine this medication with Percocet or alcohol.  He was counseled at that sleep disturbance can take weeks to months to improve.  Gabapentin taper may worsen current symptoms.  He was advised to go back to previous dose of gabapentin should this occur.  Patient is forthcoming about remote history of heroin abuse and opioid dependence. Checked PDMP, no red flags. Percocet is necessary at this time for post-op pain control, but I did recommend reducing dose as soon as possible.  Follow-up in 2 weeks or sooner as needed if symptoms worsen or fail to improve  Follow Up Instructions:    I  discussed the assessment and treatment plan with the patient. The patient was provided an opportunity to ask questions and all were answered. The patient agreed with the plan and demonstrated an understanding of the instructions.   The patient was advised to call back or seek an in-person evaluation if the symptoms worsen or if the condition fails to improve as anticipated.  I provided 11-20 minutes of non-face-to-face time during this encounter.   Carlis Stable, [place]

## 2019-02-24 NOTE — Discharge Summary (Signed)
Patient ID: Melvin Rich MRN: [id number] DOB/AGE: Dec 06, 1960 59 y.o.  Admit date: 02/17/2019 Discharge date: 02/16/2019  Admission Diagnoses:  Active Problems:   Radiculopathy   Discharge Diagnoses:  Same  Past Medical History:  Diagnosis Date   Alcohol use disorder    Chronic active hepatitis ([place]) 04/23/2017   Pt stated that he had the treatment   Chronic hepatitis C virus infection (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 male who was admitted 02/17/2019 for operative treatment of radiculopathy. Patient has severe unremitting pain that affects sleep, daily activities, and work/hobbies. After pre-op clearance the patient was taken to the operating room on 02/17/2019 and underwent  Procedure(s): ANTERIOR CERVICAL DECOMPRESSION FUSION, CERVICAL 5-6, CERVICAL 6-7 WITH INSTRUMENTATION AND ALLOGRAFT.    Patient was given perioperative antibiotics:  Anti-infectives (From admission, onward)   Start     Dose/Rate Route Frequency Ordered Stop   02/17/19 2200  ceFAZolin (ANCEF) IVPB 2g/100 mL premix     2 g 200 mL/hr over 30 Minutes Intravenous Every 8 hours 02/17/19 1757 02/18/19 0534   02/17/19 1145  ceFAZolin (ANCEF) IVPB 2g/100 mL premix     2 g 200 mL/hr over 30 Minutes Intravenous On call to O.R. 02/17/19 1137 02/17/19 1400   02/17/19 1050  ceFAZolin (ANCEF) 2-4 GM/100ML-% IVPB    Note to  Pharmacy: Marga Melnick   : cabinet override      02/17/19 1050 02/17/19 1400       Patient was given sequential compression devices, early ambulation to prevent DVT.  Patient benefited maximally from hospital stay and there were no complications.    Recent vital signs: BP (!) 152/103 (BP Location: Right Arm)    Pulse 66    Temp 98.4 F (36.9 C) (Oral)    Resp 17    Ht 5' 9.5" (1.765 m)    Wt 71.2 kg    SpO2 100%    BMI 22.85 kg/m    Discharge Medications:   Allergies as of 02/18/2019   No Known Allergies     Medication List    TAKE these medications   CENTRUM ADULTS PO Take 1 tablet by mouth daily.   cetirizine-pseudoephedrine 5-120 MG tablet Commonly known as: ZYRTEC-D Take 1 tablet by mouth 2 (two) times daily.   fluticasone 50 MCG/ACT nasal spray Commonly known as: FLONASE Place 1 spray into both nostrils daily. What changed:   when to take this  reasons to take this   oxyCODONE-acetaminophen 5-325 MG tablet Commonly known as: PERCOCET/ROXICET Take 1-2 tablets by mouth every 4 (four) hours as needed for moderate pain or severe pain.   sildenafil 100 MG tablet Commonly known as: VIAGRA Take 1 tablet (100 mg total) by mouth as needed for erectile dysfunction (for use prior to sexual activity).   valsartan 320 MG tablet Commonly known as: DIOVAN Take 1 tablet (320 mg  total) by mouth daily.       Diagnostic Studies: Dg Cervical Spine 1 View  Result Date: 02/17/2019 CLINICAL DATA:  ACDF C5-C7 EXAM: DG C-ARM 61-120 MIN; DG CERVICAL SPINE - 1 VIEW FLUOROSCOPY TIME:  9 seconds COMPARISON:  Cervical spine MRI-11/28/2018 FINDINGS: 2 spot lateral projection intraoperative fluoroscopic images of the cervical spine are provided for review. Image #1 demonstrates a radiopaque marking instrument overlying the soft tissues anterior to the C6 vertebral body. An endotracheal tube tip terminates below the field of view. Image #2 demonstrates the sequela of C5-C7 ACDF and  intervertebral disc space replacement with restoration of the C5-C6 and C6-C7 intervertebral disc space heights. Expected adjacent prevertebral soft tissue swelling. Radiopaque surgical support apparatus is noted anterior to the operative site. No definite radiopaque foreign body. IMPRESSION: Post C5-C7 ACDF and intervertebral disc space replacement without evidence of complication. Electronically Signed   By: Simonne Come M.D.   On: 02/17/2019 16:49   Dg C-arm 1-60 Min  Result Date: 02/17/2019 CLINICAL DATA:  ACDF C5-C7 EXAM: DG C-ARM 61-120 MIN; DG CERVICAL SPINE - 1 VIEW FLUOROSCOPY TIME:  9 seconds COMPARISON:  Cervical spine MRI-11/28/2018 FINDINGS: 2 spot lateral projection intraoperative fluoroscopic images of the cervical spine are provided for review. Image #1 demonstrates a radiopaque marking instrument overlying the soft tissues anterior to the C6 vertebral body. An endotracheal tube tip terminates below the field of view. Image #2 demonstrates the sequela of C5-C7 ACDF and intervertebral disc space replacement with restoration of the C5-C6 and C6-C7 intervertebral disc space heights. Expected adjacent prevertebral soft tissue swelling. Radiopaque surgical support apparatus is noted anterior to the operative site. No definite radiopaque foreign body. IMPRESSION: Post C5-C7 ACDF and intervertebral disc space replacement without evidence of complication. Electronically Signed   By: Simonne Come M.D.   On: 02/17/2019 16:49    Disposition:    POD #1 s/p ACDF, doing well  - encourage ambulation - Percocet for pain, Valium for muscle spasms - d/c home today with f/u in 2 weeks -D/C instructions sheet printed and in chart  Signed: Eilene Ghazi Rondell Pardon 02/24/2019, 10:06 AM

## 2019-03-22 ENCOUNTER — Ambulatory Visit (INDEPENDENT_AMBULATORY_CARE_PROVIDER_SITE_OTHER): Payer: 59 | Admitting: Sports Medicine

## 2019-03-22 ENCOUNTER — Ambulatory Visit (INDEPENDENT_AMBULATORY_CARE_PROVIDER_SITE_OTHER): Payer: 59

## 2019-03-22 ENCOUNTER — Other Ambulatory Visit: Payer: Self-pay

## 2019-03-22 DIAGNOSIS — M5030 Other cervical disc degeneration, unspecified cervical region: Secondary | ICD-10-CM

## 2019-03-22 DIAGNOSIS — M65312 Trigger thumb, left thumb: Secondary | ICD-10-CM | POA: Insufficient documentation

## 2019-03-22 HISTORY — DX: Trigger thumb, left thumb: M65.312

## 2019-03-22 NOTE — Progress Notes (Signed)
Subjective:    CC: Neck pain  HPI: This is a pleasant 58 year old male, he is 1 month post C5-C7 ACDF, recently he got into a hornet's nest, jerked back and felt some pain in the back of his neck.  He still has some paresthesias going into the hands and fingertips but understands that this can take several months to improve.  Addition he has pain in his thumb, left-sided, volar MCP with triggering.  Moderate, persistent, localized without radiation.  I reviewed the past medical history, family history, social history, surgical history, and allergies today and no changes were needed.  Please see the problem list section below in epic for further details.  Past Medical History: Past Medical History:  Diagnosis Date  . Alcohol use disorder   . Chronic active hepatitis (HCC) 04/23/2017   Pt stated that he had the treatment  . Chronic hepatitis C virus infection (HCC) 04/23/2017  . Degenerative disc disease, cervical   . Degenerative disc disease, cervical   . History of drug use disorder    Heroin, IVDA  . 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 Hypertension   . Left arm pain   . 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 Rheumatoid arthritis (HCC)   . Seasonal allergies   . Substance abuse (HCC)   . Transaminitis 04/23/2017   Past Surgical History: Past Surgical History:  Procedure Laterality Date  . ANTERIOR CERVICAL DECOMP/DISCECTOMY FUSION N/A 02/17/2019   Procedure: ANTERIOR CERVICAL DECOMPRESSION FUSION, CERVICAL 5-6, CERVICAL 6-7 WITH INSTRUMENTATION AND ALLOGRAFT;  Surgeon: Estill Bamberg, MD;  Location: [facility];  Service: Orthopedics;  Laterality: N/A;  . HIP SURGERY  2005  . SHOULDER SURGERY  1985   Social History: Social History   Socioeconomic History  . Marital status: Married    Spouse name: Not on file  . Number of children: Not on file  . Years of education: Not on file  . Highest education level: Not on file  Occupational History  . Not on file  Social Needs   . Financial resource strain: Not on file  . Food insecurity    Worry: Not on file    Inability: Not on file  . Transportation needs    Medical: Not on file    Non-medical: Not on file  Tobacco Use  . Smoking status: Current Every Day Smoker    Packs/day: 0.50    Years: 47.00    Pack years: 23.50    Types: Cigarettes  . Smokeless tobacco: Never Used  Substance and Sexual Activity  . Alcohol use: Yes    Alcohol/week: 4.0 standard drinks    Types: 4 Cans of beer per week    Comment: daily beer  . Drug use: Not Currently    Types: Heroin    Comment: sober since 2004  . Sexual activity: Yes    Birth control/protection: None  Lifestyle  . Physical activity    Days per week: Not on file    Minutes per session: Not on file  . Stress: Not on file  Relationships  . Social Musician on phone: Not on file    Gets together: Not on file    Attends religious service: Not on file    Active member of club or organization: Not on file    Attends meetings of clubs or organizations: Not on file    Relationship status: Not on file  Other Topics Concern  . Not on file  Social History Narrative  . Not on file   Family History: Family History  Adopted: Yes  Family history unknown: Yes   Allergies: No Known Allergies Medications: See med rec.  Review of Systems: No fevers, chills, night sweats, weight loss, chest pain, or shortness of breath.   Objective:    General: Well Developed, well nourished, and in no acute distress.  Neuro: Alert and oriented x3, extra-ocular muscles intact, sensation grossly intact.  HEENT: Normocephalic, atraumatic, pupils equal round reactive to light, neck supple, no masses, no lymphadenopathy, thyroid nonpalpable.  Skin: Warm and dry, no rashes. Cardiac: Regular rate and rhythm, no murmurs rubs or gallops, no lower extremity edema.  Respiratory: Clear to auscultation bilaterally. Not using accessory muscles, speaking in full sentences.  Left hand: Palpable flexor tendon nodule at the FPL.  Procedure: Real-time Ultrasound Guided injection of the left flexor pollicis longus tendon sheath Device: GE Logiq E  Verbal informed consent obtained.  Time-out conducted.  Noted no overlying erythema, induration, or other signs of local infection.  Skin prepped in a sterile fashion.  Local anesthesia: Topical Ethyl chloride.  With sterile technique and under real time ultrasound guidance: 1/2 cc Kenalog 40, 1/2 cc lidocaine injected easily into the FPL tendon sheath. Completed without difficulty  Pain immediately resolved suggesting accurate placement of the medication.  Advised to call if fevers/chills, erythema, induration, drainage, or persistent bleeding.  Images permanently stored and available for review in the ultrasound unit.  Impression: Technically successful ultrasound guided injection.  Impression and Recommendations:    Trigger thumb, left thumb FPL tendon sheath injection. Return in 1 month for this.  DDD (degenerative disc disease), cervical Post C5-C7 ACDF, continues to improve, he did get into hornets nest and I think strained his neck. Adding cervical spine flexion/extension x-rays, but this will continue to improve on his own. I have advised him to keep his neck care with Dr. Lynann Bologna.   ___________________________________________ Gwen Her. Dianah Field, M.D., ABFM., CAQSM. Primary Care and Sports Medicine [facility]  Adjunct Professor of [employer] [facility] [employer] [employer]

## 2019-03-22 NOTE — Assessment & Plan Note (Signed)
FPL tendon sheath injection. Return in 1 month for this.

## 2019-03-22 NOTE — Assessment & Plan Note (Signed)
Post C5-C7 ACDF, continues to improve, he did get into hornets nest and I think strained his neck. Adding cervical spine flexion/extension x-rays, but this will continue to improve on his own. I have advised him to keep his neck care with Dr. Lynann Bologna.

## 2019-03-23 ENCOUNTER — Encounter: Payer: Self-pay | Admitting: Sports Medicine

## 2019-04-01 ENCOUNTER — Encounter: Payer: Self-pay | Admitting: Sports Medicine

## 2019-04-01 DIAGNOSIS — G8929 Other chronic pain: Secondary | ICD-10-CM

## 2019-04-01 DIAGNOSIS — M542 Cervicalgia: Secondary | ICD-10-CM

## 2019-04-01 DIAGNOSIS — Z981 Arthrodesis status: Secondary | ICD-10-CM

## 2019-04-01 DIAGNOSIS — M5030 Other cervical disc degeneration, unspecified cervical region: Secondary | ICD-10-CM

## 2019-04-05 ENCOUNTER — Other Ambulatory Visit: Payer: Self-pay

## 2019-04-05 ENCOUNTER — Encounter: Payer: Self-pay | Admitting: Physical Therapy

## 2019-04-05 ENCOUNTER — Ambulatory Visit: Payer: 59 | Admitting: Physical Therapy

## 2019-04-05 DIAGNOSIS — M6281 Muscle weakness (generalized): Secondary | ICD-10-CM | POA: Diagnosis not present

## 2019-04-05 DIAGNOSIS — M542 Cervicalgia: Secondary | ICD-10-CM

## 2019-04-05 DIAGNOSIS — M436 Torticollis: Secondary | ICD-10-CM | POA: Diagnosis not present

## 2019-04-05 NOTE — Therapy (Signed)
[facility] [address] [place], [place], [address] Phone: [phone]   Fax:  [phone]  Physical Therapy Evaluation  Patient Details  Name: Melvin Rich MRN: [id number] Date of Birth: 09-Jan-1961 Referring Provider (PT): Estill Bamberg, MD   Encounter Date: 04/05/2019  PT End of Session - 04/05/19 0823    Visit Number  1    Number of Visits  8    Date for PT Re-Evaluation  06/03/19    Authorization Type  [employer]    PT Start Time  0803    PT Stop Time  0845    PT Time Calculation (min)  42 min    Activity Tolerance  Patient tolerated treatment well    Behavior During Therapy  [facility] for tasks assessed/performed       Past Medical History:  Diagnosis Date  . Alcohol use disorder   . Chronic active hepatitis (HCC) 04/23/2017   Pt stated that he had the treatment  . Chronic hepatitis C virus infection (HCC) 04/23/2017  . Degenerative disc disease, cervical   . Degenerative disc disease, cervical   . History of drug use disorder    Heroin, IVDA  . 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 Hypertension   . Left arm pain   . 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 Rheumatoid arthritis (HCC)   . Seasonal allergies   . Substance abuse (HCC)   . Transaminitis 04/23/2017    Past Surgical History:  Procedure Laterality Date  . ANTERIOR CERVICAL DECOMP/DISCECTOMY FUSION N/A 02/17/2019   Procedure: ANTERIOR CERVICAL DECOMPRESSION FUSION, CERVICAL 5-6, CERVICAL 6-7 WITH INSTRUMENTATION AND ALLOGRAFT;  Surgeon: Estill Bamberg, MD;  Location: [facility] OR;  Service: Orthopedics;  Laterality: N/A;  . HIP SURGERY  2005  . SHOULDER SURGERY  1985    There were no vitals filed for this visit.   Subjective Assessment - 04/05/19 0814    Subjective  Pt arriving to therpay s/p cervical C4-7 fusion on 02/17/2019. Pt still reporting 5/10 pain in his neck with numbness and tingling in his bilateral hands and fingers. Pt did report he is  doing better since surgery, but hasn't returned to 100% prior to injury.    Limitations  Sitting    Patient Stated Goals  Get back to work, ride my motorcycle without problems    Currently in Pain?  Yes    Pain Score  5     Pain Location  Neck    Pain Descriptors / Indicators  Aching    Pain Type  Acute pain    Pain Radiating Towards  towards finger tips and hands    Pain Onset  More than a month ago    Pain Frequency  Constant    Aggravating Factors   sitting    Pain Relieving Factors  medication, tylenol    Effect of Pain on Daily Activities  unable to work         [facility] PT Assessment - 04/05/19 0001      Assessment   Medical Diagnosis  M50.30, M54.2, Z98.1   DDD cervical, chronic neck pain, s/p cervical fusion   Referring Provider (PT)  Estill Bamberg, MD    Onset Date/Surgical Date  02/17/19    Hand Dominance  Right    Prior Therapy  no      Precautions   Precaution Comments  10 pound lifting restriction      Restrictions   Weight Bearing Restrictions  No      Balance Screen   Has the patient fallen in the past 6 months  No    Is the patient reluctant to leave their home because of a fear of falling?   No      Home Public house manager residence      Prior Function   Level of Independence  Independent    Vocation  Full time employment    Garment/textile technologist    Leisure  work on motorcycles      Cognition   Overall Cognitive Status  Within Functional Limits for tasks assessed      Observation/Other Assessments   Focus on Therapeutic Outcomes (FOTO)   deferred due to decreased frequency due to high copay      Posture/Postural Control   Posture/Postural Control  Postural limitations    Postural Limitations  Rounded Shoulders;Forward head      ROM / Strength   AROM / PROM / Strength  AROM;Strength      AROM   AROM Assessment Site  Cervical    Cervical Flexion  15 degrees    Cervical Extension  5 degrees    Cervical - Right  Side Bend  10 degrees    Cervical - Left Side Bend  12 degrees    Cervical - Right Rotation  20 degrees    Cervical - Left Rotation  40 degrees      Strength   Overall Strength  --   all cervical MMT was limited by pain   Overall Strength Comments  R grip:80ppsi  L grip:80ppsi    Strength Assessment Site  Hand;Cervical    Cervical Flexion  3/5    Cervical Extension  3/5    Cervical - Right Rotation  3/5    Cervical - Left Rotation  3/5      Palpation   Palpation comment  Pt with active trigger points noted in bilateral upper trap and along medial scapular borders, TTP  thoracic paraspinals T2-T7      Special Tests   Other special tests  thumb to finger opposition intact bilaterally      Ambulation/Gait   Gait Comments  decreased arm swing on the R compared to left                Objective measurements completed on examination: See above findings.              PT Education - 04/05/19 0822    Education Details  PT POC, posture, cervical retraction, UT stretches, levator stretch, door stretch    Person(s) Educated  Patient    Methods  Explanation;Demonstration;Verbal cues    Comprehension  Verbalized understanding;Returned demonstration          PT Long Term Goals - 04/05/19 0824      PT LONG TERM GOAL #1   Title  Pt will be indepdent in his HEP and progression.    Baseline  initial HEP issued on 04/05/19    Time  8    Period  Weeks    Status  New    Target Date  06/03/19      PT LONG TERM GOAL #2   Title  Pt will be able to return to work with pain </= 3/10.    Baseline  reporting 5/10 at rest and unable to return to work currently    Time  8    Period  Weeks  Status  New    Target Date  06/03/19      PT LONG TERM GOAL #3   Title  Pt will improve cervical rotation AROM to >/= 50 degrees bilaterally.    Baseline  R: 20 degrees, L: 40 degrees    Time  8    Period  Weeks    Status  New    Target Date  06/03/19      PT LONG TERM GOAL #4    Title  Pt will be able to improve bilateral cervical SB to 15 degrees bilaterally in order to return to work and improve functional mobility.    Baseline  L SB: 12 degrees, R SB: 10 degrees    Time  8    Period  Weeks    Status  New    Target Date  06/03/19             Plan - 04/05/19 1016    Clinical Impression Statement  Pt arriving to therapy s/p Cervical fusion C4-7 on 02/17/2019. Pt reporting 5/10 pain in neck with radiation into arms down to fingers. Pt reporting pain has improved since surgery, but he is not back to work or his PLOF. 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 Pt with grip strength of 80 ppsi bilaterally. Pt with active trigger points noted in bilateral upper traps. Pt presenting with limited ROM in cervical rotation, SB, and extension. Pt still with lifting restriction of 10 pounds. Skilled PT needed to address pt impairments with the below interventiosn.    Personal Factors and Comorbidities  Comorbidity 1    Comorbidities  HTN    Examination-Activity Limitations  Carry    Examination-Participation Restrictions  Yard Work;Other    Stability/Clinical Decision Making  Stable/Uncomplicated    Clinical Decision Making  Low    Rehab Potential  Good    PT Frequency  1x / week   due to $50 copay, pt with decreased frequency to 1x/week to once every other week secondary to financial strains.   PT Duration  8 weeks    PT Treatment/Interventions  Electrical Stimulation;Therapeutic activities;Therapeutic exercise;Patient/family education;Manual techniques;Passive range of motion;Taping;Ultrasound;Moist Heat    PT Next Visit Plan  cervical ROM/strengthening, shoulder ROM, STM and modalites as tolerated    PT Home Exercise Plan  UT stretch, Levator stretch, door stretch, cervical retraction in supine    Consulted and Agree with Plan of Care  Patient       Patient will benefit from skilled  therapeutic intervention in order to improve the following deficits and impairments:  Decreased mobility, Decreased range of motion, Pain, Decreased strength, Decreased activity tolerance, Impaired UE functional use  Visit Diagnosis: 1. Cervicalgia   2. Muscle weakness (generalized)   3. Stiffness of neck        Problem List Patient Active Problem List   Diagnosis Date Noted  . Trigger thumb, left thumb 03/22/2019  . Status post cervical spinal fusion 02/22/2019  . Chronic neck pain 02/22/2019  . Irritability and anger 02/22/2019  . History of opioid abuse (HCC) 02/22/2019  . Radiculopathy 02/17/2019  . 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 BPPV (benign paroxysmal positional vertigo), right 07/27/2018  . Hearing loss of left ear 07/20/2018  . Eustachian tube dysfunction, bilateral 02/23/2018  . Cigarette nicotine dependence without complication 12/26/2017  . Sleep disturbance 12/26/2017  . Encounter for medication  management 12/26/2017  . Paresthesias 12/26/2017  . 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 Colon cancer screening 08/03/2017  . Poor dentition 05/05/2017  . 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 Transaminitis 04/23/2017  . Hypertension goal BP (blood pressure) < 130/80 04/18/2017  . Drinks three beers per day 04/18/2017  . DDD (degenerative disc disease), cervical 04/18/2017  . Rheumatoid arthritis involving both elbows ([place]) 04/18/2017  . H/O drug abuse ([place]) 07/27/2012    Oretha Caprice, PT 04/05/2019, 10:33 AM  [facility] [place], [place], [address] Phone: [phone]   Fax:  [phone]  Name: DMETRIUS AMBS MRN: [id number] Date of Birth: 11/18/1960

## 2019-04-08 ENCOUNTER — Other Ambulatory Visit: Payer: Self-pay

## 2019-04-08 DIAGNOSIS — I10 Essential (primary) hypertension: Secondary | ICD-10-CM

## 2019-04-08 MED ORDER — VALSARTAN 320 MG PO TABS: 320.0000 mg | ORAL_TABLET | Freq: Every day | ORAL | 1 refills | Status: DC

## 2019-04-12 ENCOUNTER — Ambulatory Visit (INDEPENDENT_AMBULATORY_CARE_PROVIDER_SITE_OTHER): Payer: 59 | Admitting: Rehabilitative and Restorative Service Providers"

## 2019-04-12 ENCOUNTER — Other Ambulatory Visit: Payer: Self-pay

## 2019-04-12 ENCOUNTER — Encounter: Payer: Self-pay | Admitting: Rehabilitative and Restorative Service Providers"

## 2019-04-12 DIAGNOSIS — M6281 Muscle weakness (generalized): Secondary | ICD-10-CM

## 2019-04-12 DIAGNOSIS — M436 Torticollis: Secondary | ICD-10-CM | POA: Diagnosis not present

## 2019-04-12 DIAGNOSIS — M542 Cervicalgia: Secondary | ICD-10-CM

## 2019-04-12 NOTE — Therapy (Addendum)
[facility] [place], [place], [address] Phone: [phone]   Fax:  [phone]  Physical Therapy Treatment  Patient Details  Name: Melvin Rich MRN: [id number] Date of Birth: 12-May-1961 Referring Provider (PT): Phylliss Bob, MD   Encounter Date: 04/12/2019  PT End of Session - 04/12/19 0814    Visit Number  2    Number of Visits  8    Date for PT Re-Evaluation  06/03/19    PT Start Time  0814    PT Stop Time  0845    PT Time Calculation (min)  31 min    Activity Tolerance  Patient tolerated treatment well       Past Medical History:  Diagnosis Date  . Alcohol use disorder   . Chronic active hepatitis ([place]) 04/23/2017   Pt stated that he had the treatment  . Chronic hepatitis C virus infection ([place]) 04/23/2017  . Degenerative disc disease, cervical   . Degenerative disc disease, cervical   . History of drug use disorder    Heroin, IVDA  . 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 Hypertension   . Left arm pain   . 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 Rheumatoid arthritis ([place])   . Seasonal allergies   . Substance abuse ([place])   . Transaminitis 04/23/2017    Past Surgical History:  Procedure Laterality Date  . ANTERIOR CERVICAL DECOMP/DISCECTOMY FUSION N/A 02/17/2019   Procedure: ANTERIOR CERVICAL DECOMPRESSION FUSION, CERVICAL 5-6, CERVICAL 6-7 WITH INSTRUMENTATION AND ALLOGRAFT;  Surgeon: Phylliss Bob, MD;  Location: [place];  Service: Orthopedics;  Laterality: N/A;  . HIP SURGERY  2005  . SHOULDER SURGERY  1985    There were no vitals filed for this visit.  Subjective Assessment - 04/12/19 0818    Subjective  Patient reports continued pain at about the same level. The exercises seem to irritate the neck some so he is doing them every other day.    Currently in Pain?  Yes    Pain Score  5     Pain Location  Neck    Pain Orientation  --   center back btn shoulder blades   Pain  Descriptors / Indicators  Aching    Pain Type  Acute pain                       [facility] Adult PT Treatment/Exercise - 04/12/19 0001      Neuro Re-ed    Neuro Re-ed Details   education re- cervical and thoracic posture and alignment       Shoulder Exercises: Standing   Other Standing Exercises  axial extension 5 sec x 10 reps; scap squeeze 10 sec x 10; L's x 10 with noodle       Shoulder Exercises: Stretch   Other Shoulder Stretches  3 way doorway stretch 30 sec x 2 reps each position       Manual Therapy   Manual therapy comments  pt supine hooklying     Soft tissue mobilization  working through the posterior cervical and thoracic spine paraspinals; anterior/lateral/posterior cervica lmusculature     Myofascial Release  anterior chest/pecs     Scapular Mobilization  PA and lateral mobs mid thoracic spine in area of pain/tightness              PT Education - 04/12/19 0826    Education Details  HEP    Person(s)  Educated  Patient    Methods  Explanation;Demonstration;Tactile cues;Verbal cues;Handout    Comprehension  Verbalized understanding;Returned demonstration;Verbal cues required;Tactile cues required          PT Long Term Goals - 04/05/19 0824      PT LONG TERM GOAL #1   Title  Pt will be indepdent in his HEP and progression.    Baseline  initial HEP issued on 04/05/19    Time  8    Period  Weeks    Status  New    Target Date  06/03/19      PT LONG TERM GOAL #2   Title  Pt will be able to return to work with pain </= 3/10.    Baseline  reporting 5/10 at rest and unable to return to work currently    Time  8    Period  Weeks    Status  New    Target Date  06/03/19      PT LONG TERM GOAL #3   Title  Pt will improve cervical rotation AROM to >/= 50 degrees bilaterally.    Baseline  R: 20 degrees, L: 40 degrees    Time  8    Period  Weeks    Status  New    Target Date  06/03/19      PT LONG TERM GOAL #4   Title  Pt will be able to improve  bilateral cervical SB to 15 degrees bilaterally in order to return to work and improve functional mobility.    Baseline  L SB: 12 degrees, R SB: 10 degrees    Time  8    Period  Weeks    Status  New    Target Date  06/03/19            Plan - 04/12/19 0818    Clinical Impression Statement  Patient demonstrates poor posture and alignment in sitting and standing. He has muscular tightness through the cervical and thoracic spine. Will benefit from postural correction; neuromuscular re-ed; stretching; strengthening; manual work - not currently interested in DN    Personal Factors and Comorbidities  Comorbidity 1    Comorbidities  HTN    Examination-Activity Limitations  Carry    Examination-Participation Restrictions  Tour manager    Stability/Clinical Decision Making  Stable/Uncomplicated    Rehab Potential  Good    PT Frequency  1x / week   due to $50 copay, pt with decreased frequency to 1x/week to once every other week secondary to financial strains.   PT Duration  8 weeks    PT Treatment/Interventions  Electrical Stimulation;Therapeutic activities;Therapeutic exercise;Patient/family education;Manual techniques;Passive range of motion;Taping;Ultrasound;Moist Heat    PT Next Visit Plan  cervical ROM/strengthening, shoulder ROM, STM and modalites as tolerated    PT Home Exercise Plan  UT stretch, Levator stretch, door stretch, cervical retraction in supine(last visit)   [id number] - today    Consulted and Agree with Plan of Care  Patient       Patient will benefit from skilled therapeutic intervention in order to improve the following deficits and impairments:  Decreased mobility, Decreased range of motion, Pain, Decreased strength, Decreased activity tolerance, Impaired UE functional use  Visit Diagnosis: 1. Cervicalgia   2. Muscle weakness (generalized)   3. Stiffness of neck        Problem List Patient Active Problem List   Diagnosis Date Noted  . Trigger thumb, left  thumb 03/22/2019  . Status post cervical spinal  fusion 02/22/2019  . Chronic neck pain 02/22/2019  . Irritability and anger 02/22/2019  . History of opioid abuse ([place]) 02/22/2019  . Radiculopathy 02/17/2019  . 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 BPPV (benign paroxysmal positional vertigo), right 07/27/2018  . Hearing loss of left ear 07/20/2018  . Eustachian tube dysfunction, bilateral 02/23/2018  . Cigarette nicotine dependence without complication 82/04/1387  . Sleep disturbance 12/26/2017  . Encounter for medication management 12/26/2017  . Paresthesias 12/26/2017  . 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 Colon cancer screening 08/03/2017  . Poor dentition 05/05/2017  . 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 Transaminitis 04/23/2017  . Hypertension goal BP (blood pressure) < 130/80 04/18/2017  . Drinks three beers per day 04/18/2017  . DDD (degenerative disc disease), cervical 04/18/2017  . Rheumatoid arthritis involving both elbows ([place]) 04/18/2017  . H/O drug abuse ([place]) 07/27/2012    Yanai Hobson Nilda Simmer PT, MPH  04/12/2019, 8:48 AM  [facility], [place], [address] Phone: [phone]   Fax:  [phone]  Name: TEIGE ROUNTREE MRN: [id number] Date of Birth: 12-17-1960  PHYSICAL THERAPY DISCHARGE SUMMARY  Visits from Start of Care: 2  Current functional level related to goals / functional outcomes: See last progress note for discharge status   Remaining deficits: Unknown    Education / Equipment: HEP  Plan: Patient agrees to discharge.  Patient goals were not met. Patient is being discharged due to not returning since the last visit.  ?????     Jameson Tormey P. Helene Kelp PT, MPH 05/06/19 9:08 AM

## 2019-04-12 NOTE — Patient Instructions (Signed)
Access Code: [email or web address]  URL: [email or web address]  Date: 04/12/2019  Prepared by: Gillermo Murdoch   Exercises  Seated Cervical Retraction - 10 reps - 1 sets - 3x daily - 7x weekly  Standing Scapular Retraction - 10 reps - 1 sets - 10 hold - 3x daily - 7x weekly  Shoulder External Rotation and Scapular Retraction - 10 reps - 1 sets - hold - 3x daily - 7x weekly  Doorway Pec Stretch at 60 Degrees Abduction - 3 reps - 1 sets - 3x daily - 7x weekly  Doorway Pec Stretch at 90 Degrees Abduction - 3 reps - 1 sets - 30 seconds hold - 3x daily - 7x weekly  Doorway Pec Stretch at 120 Degrees Abduction - 3 reps - 1 sets - 30 second hold hold - 3x daily - 7x weekly

## 2019-04-18 ENCOUNTER — Encounter: Payer: 59 | Admitting: Rehabilitative and Restorative Service Providers"

## 2019-04-19 ENCOUNTER — Ambulatory Visit: Payer: 59 | Admitting: Sports Medicine

## 2019-05-23 ENCOUNTER — Encounter: Payer: Self-pay | Admitting: Physician Assistant

## 2019-05-24 ENCOUNTER — Encounter: Payer: Self-pay | Admitting: Sports Medicine

## 2019-05-24 DIAGNOSIS — I10 Essential (primary) hypertension: Secondary | ICD-10-CM

## 2019-05-24 MED ORDER — VALSARTAN 320 MG PO TABS: 320.0000 mg | ORAL_TABLET | Freq: Every day | ORAL | 0 refills | Status: DC

## 2019-10-05 ENCOUNTER — Ambulatory Visit: Payer: 59 | Admitting: Sports Medicine

## 2019-11-15 ENCOUNTER — Emergency Department
Admission: EM | Admit: 2019-11-15 | Discharge: 2019-11-15 | Disposition: A | Discharge status: Home / Self Care | Payer: Self-pay | Source: Home / Self Care

## 2019-11-15 ENCOUNTER — Emergency Department (INDEPENDENT_AMBULATORY_CARE_PROVIDER_SITE_OTHER): Payer: Self-pay

## 2019-11-15 ENCOUNTER — Telehealth: Payer: Self-pay | Admitting: Emergency Medicine

## 2019-11-15 ENCOUNTER — Other Ambulatory Visit: Payer: Self-pay

## 2019-11-15 DIAGNOSIS — R0602 Shortness of breath: Secondary | ICD-10-CM

## 2019-11-15 DIAGNOSIS — I517 Cardiomegaly: Secondary | ICD-10-CM

## 2019-11-15 DIAGNOSIS — I444 Left anterior fascicular block: Secondary | ICD-10-CM

## 2019-11-15 DIAGNOSIS — J810 Acute pulmonary edema: Secondary | ICD-10-CM

## 2019-11-15 DIAGNOSIS — I4510 Unspecified right bundle-branch block: Secondary | ICD-10-CM

## 2019-11-15 MED ORDER — ASPIRIN 81 MG PO CHEW
324.0000 mg | CHEWABLE_TABLET | Freq: Once | ORAL | Status: AC
  Administered 2019-11-15: 324 mg via ORAL

## 2019-11-15 NOTE — ED Triage Notes (Signed)
Pt c/o fatigue and inability to catch his breath x 4 days. Mentions feeling achy, but is always achy and a runny nose. Denies any other sxs.

## 2019-11-15 NOTE — Discharge Instructions (Signed)
  You have declined EMS transport but it is still advised you have your friend drive you directly to [facility] for further evaluation and treatment of your new onset of symptoms due to enlarged heart and fluid on your lungs.

## 2019-11-15 NOTE — ED Notes (Signed)
Pt to [facility] ED -will go via private vehicle - per pt a friend will drive him. Pt verbalized an understanding that he should go straight to the hospital for further blood work and tests. Baby ASA (324mg ) given as documented. 12 lead EKG in chart done today at Urgent Care.

## 2019-11-15 NOTE — ED Provider Notes (Signed)
[facility]    CSN: [id number] Arrival date & time: 11/15/19  1318      History   Chief Complaint Chief Complaint  Patient presents with  . Shortness of Breath  . Fatigue    HPI BRICEN VICTORY is a 59 y.o. male.   HPI BRITTIN JANIK is a 59 y.o. male presenting to UC with c/o 4 days of gradually worsening fatigue, shortness of breath, DOE and a runny nose. He also feels achy but reports always feeling achy. Denies HA, sore throat, fever, chills, n/v/d. No known sick contacts. Denies chest pain. No hx of CAD or asthma. No medication tried PTA.    Past Medical History:  Diagnosis Date  . Alcohol use disorder   . Chronic active hepatitis (HCC) 04/23/2017   Pt stated that he had the treatment  . Chronic hepatitis C virus infection (HCC) 04/23/2017  . Degenerative disc disease, cervical   . Degenerative disc disease, cervical   . History of drug use disorder    Heroin, IVDA  . 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 Hypertension   . Left arm pain   . 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 Rheumatoid arthritis (HCC)   . Seasonal allergies   . Substance abuse (HCC)   . Transaminitis 04/23/2017    Patient Active Problem List   Diagnosis Date Noted  . Trigger thumb, left thumb 03/22/2019  . Status post cervical spinal fusion 02/22/2019  . Chronic neck pain 02/22/2019  . Irritability and anger 02/22/2019  . History of opioid abuse (HCC) 02/22/2019  . Radiculopathy 02/17/2019  . 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 BPPV (benign paroxysmal positional vertigo), right 07/27/2018  . Hearing loss of left ear 07/20/2018  . Eustachian tube dysfunction, bilateral 02/23/2018  . Cigarette nicotine dependence without complication 12/26/2017  . Sleep disturbance 12/26/2017  . Encounter for medication management 12/26/2017  . Paresthesias 12/26/2017  . 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 Colon cancer  screening 08/03/2017  . Poor dentition 05/05/2017  . 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 Transaminitis 04/23/2017  . Hypertension goal BP (blood pressure) < 130/80 04/18/2017  . Drinks three beers per day 04/18/2017  . DDD (degenerative disc disease), cervical 04/18/2017  . Rheumatoid arthritis involving both elbows (HCC) 04/18/2017  . H/O drug abuse (HCC) 07/27/2012    Past Surgical History:  Procedure Laterality Date  . ANTERIOR CERVICAL DECOMP/DISCECTOMY FUSION N/A 02/17/2019   Procedure: ANTERIOR CERVICAL DECOMPRESSION FUSION, CERVICAL 5-6, CERVICAL 6-7 WITH INSTRUMENTATION AND ALLOGRAFT;  Surgeon: Estill Bamberg, MD;  Location: [facility] OR;  Service: Orthopedics;  Laterality: N/A;  . HIP SURGERY  2005  . SHOULDER SURGERY  1985       Home Medications    Prior to Admission medications   Medication Sig Start Date End Date Taking? Authorizing Provider  cetirizine-pseudoephedrine (ZYRTEC-D) 5-120 MG tablet Take 1 tablet by mouth 2 (two) times daily.    [provider]  fluticasone (FLONASE) 50 MCG/ACT nasal spray Place 1 spray into both nostrils daily. Patient not taking: Reported on 04/05/2019 02/23/18   Carlis Stable, PA-C  gabapentin (NEURONTIN) 600 MG tablet Day 1-4: 2 tabs QAM, 1 tab afternoon, and 2 tabs QHS. Day 5-8: 2 tabs twice a day. Day 9-12: take 1 tab QAM, 1 tab afternoon, and 1 tab QHS; Day 13-16: 1 tab twice a day Patient not taking: Reported on  04/05/2019 02/22/19   Carlis Stable, PA-C  Multiple Vitamins-Minerals (CENTRUM ADULTS PO) Take 1 tablet by mouth daily.     [provider]  oxyCODONE-acetaminophen (PERCOCET/ROXICET) 5-325 MG tablet Take 1-2 tablets by mouth every 4 (four) hours as needed for moderate pain or severe pain. 02/18/19   Estill Bamberg, MD  sildenafil (VIAGRA) 100 MG tablet Take 1 tablet (100 mg total) by mouth as needed for erectile dysfunction (for use prior to sexual  activity). 07/20/18   Carlis Stable, PA-C  temazepam (RESTORIL) 7.5 MG capsule Take 1-2 capsules (7.5-15 mg total) by mouth at bedtime as needed for sleep. Patient not taking: Reported on 04/05/2019 02/22/19   Carlis Stable, PA-C  valsartan (DIOVAN) 320 MG tablet Take 1 tablet (320 mg total) by mouth daily. 05/24/19   Monica Becton, MD    Family History Family History  Adopted: Yes  Family history unknown: Yes    Social History Social History   Tobacco Use  . Smoking status: Current Every Day Smoker    Packs/day: 0.50    Years: 47.00    Pack years: 23.50    Types: Cigarettes  . Smokeless tobacco: Never Used  Substance Use Topics  . Alcohol use: Yes    Alcohol/week: 4.0 standard drinks    Types: 4 Cans of beer per week    Comment: daily beer  . Drug use: Not Currently    Types: Heroin    Comment: sober since 2004     Allergies   Patient has no known allergies.   Review of Systems Review of Systems  Constitutional: Positive for fatigue. Negative for chills and fever.  HENT: Negative for congestion, ear pain, sore throat, trouble swallowing and voice change.   Respiratory: Positive for shortness of breath. Negative for cough.   Cardiovascular: Negative for chest pain and palpitations.  Gastrointestinal: Negative for abdominal pain, diarrhea, nausea and vomiting.  Musculoskeletal: Positive for arthralgias, back pain and myalgias.       Body aches  Skin: Negative for rash.  Neurological: Negative for dizziness, light-headedness and headaches.  All other systems reviewed and are negative.    Physical Exam Triage Vital Signs ED Triage Vitals  Enc Vitals Group     BP 11/15/19 1332 (!) 142/99     Pulse Rate 11/15/19 1332 93     Resp 11/15/19 1332 16     Temp 11/15/19 1332 97.8 F (36.6 C)     Temp Source 11/15/19 1332 Oral     SpO2 11/15/19 1332 100 %     Weight 11/15/19 1333 158 lb 11.7 oz (72 kg)     Height 11/15/19 1333 5'  9.5" (1.765 m)     Head Circumference --      Peak Flow --      Pain Score 11/15/19 1333 0     Pain Loc --      Pain Edu? --      Excl. in GC? --    No data found.  Updated Vital Signs BP (!) 142/99 (BP Location: Right Arm)   Pulse 93   Temp 97.8 F (36.6 C) (Oral)   Resp 16   Ht 5' 9.5" (1.765 m)   Wt 158 lb 11.7 oz (72 kg)   SpO2 100%   BMI 23.10 kg/m   Visual Acuity Right Eye Distance:   Left Eye Distance:   Bilateral Distance:    Right Eye Near:   Left Eye Near:    Bilateral  Near:     Physical Exam Vitals and nursing note reviewed.  Constitutional:      Appearance: He is well-developed.  HENT:     Head: Normocephalic and atraumatic.  Cardiovascular:     Rate and Rhythm: Normal rate and regular rhythm.  Pulmonary:     Effort: Pulmonary effort is normal.     Breath sounds: Examination of the right-lower field reveals decreased breath sounds. Examination of the left-lower field reveals decreased breath sounds. Decreased breath sounds present. No wheezing, rhonchi or rales.  Musculoskeletal:        General: Normal range of motion.     Cervical back: Normal range of motion.  Skin:    General: Skin is warm and dry.  Neurological:     Mental Status: He is alert and oriented to person, place, and time.  Psychiatric:        Behavior: Behavior normal.      UC Treatments / Results  Labs (all labs ordered are listed, but only abnormal results are displayed) Labs Reviewed  NOVEL CORONAVIRUS, NAA    EKG Date/Time:11/15/2019   14:21:54 Ventricular Rate: 88 PR Interval: 126 QRS Duration: 152 QT Interval: 418 QTC Calculation: 505 P-R-T axes: 59   -71   -57 Text Interpretation: Normal sinus rhythm. Right bundle branch block. Left anterior fascicular block, *bifascicular block*. Minimal voltage criteria for LVH, may be normal variant . T wave abnormality, consider lateral ischemia. Abnormal EKG.   02/14/2019 EKG: Normal Sinus Rhythm, Left axis deviation,  nonspecific intra-ventricular conduction block, nonspecific T wave abnormality.   Radiology DG Chest 2 View  Result Date: 11/15/2019 CLINICAL DATA:  Increasing shortness of breath for 4 days. EXAM: CHEST - 2 VIEW COMPARISON:  Chest x-ray dated 10/04/2018 FINDINGS: There is new borderline cardiomegaly. The patient has interstitial pulmonary edema, right greater than left with Kerley B-lines at both lung bases. The pulmonary vascularity is normal. No effusions. No significant bone abnormality. IMPRESSION: New borderline cardiomegaly with interstitial pulmonary edema, right greater than left. Electronically Signed   By: Lorriane Shire M.D.   On: 11/15/2019 13:59    Procedures Procedures (including critical care time)  Medications Ordered in UC Medications  aspirin chewable tablet 324 mg (324 mg Oral Given 11/15/19 1430)    Initial Impression / Assessment and Plan / UC Course  I have reviewed the triage vital signs and the nursing notes.  Pertinent labs & imaging results that were available during my care of the patient were reviewed by me and considered in my medical decision making (see chart for details).     Due to new onset SOB, borderline cardiomegaly and abnormal EKG, recommend further evaluation in emergency department. Pt declined EMS transport. Stated he will have a friend drive him to [facility]. Pt given 324mg  aspirin prior to discharge.  AVS provided.  Final Clinical Impressions(s) / UC Diagnoses   Final diagnoses:  Shortness of breath  Cardiomegaly  Acute pulmonary edema (HCC)  Right bundle branch block  Left anterior fascicular block     Discharge Instructions      You have declined EMS transport but it is still advised you have your friend drive you directly to [facility] for further evaluation and treatment of your new onset of symptoms due to enlarged heart and fluid on your lungs.     ED Prescriptions    None     PDMP not  reviewed this encounter.   Noe Gens, [place] 11/15/19 1511

## 2019-11-16 ENCOUNTER — Encounter: Payer: Self-pay | Admitting: Sports Medicine

## 2019-11-16 ENCOUNTER — Ambulatory Visit (INDEPENDENT_AMBULATORY_CARE_PROVIDER_SITE_OTHER): Payer: Self-pay | Admitting: Sports Medicine

## 2019-11-16 DIAGNOSIS — I517 Cardiomegaly: Secondary | ICD-10-CM

## 2019-11-16 DIAGNOSIS — M5030 Other cervical disc degeneration, unspecified cervical region: Secondary | ICD-10-CM

## 2019-11-16 HISTORY — DX: Cardiomegaly: I51.7

## 2019-11-16 LAB — NOVEL CORONAVIRUS, NAA: SARS-CoV-2, NAA: NOT DETECTED

## 2019-11-16 NOTE — Progress Notes (Signed)
    Procedures performed today:    None.  Independent interpretation of notes and tests performed by another provider:   None.  Impression and Recommendations:    DDD (degenerative disc disease), cervical Melvin Rich returns, he is post ACDF, 2 level with Dr. Yevette Edwards in June 2020. His neck pain is much better but he continues to have paresthesias into both hands, he had seen Dr. Yevette Edwards who had recommended nerve conduction and EMG for further evaluation as to whether this represents carpal tunnel, cubital tunnel, or radiculitis. He never had this done so we will proceed with EMG nerve conduction with [facility], no medications for now. Follow-up with me after the nerve conduction study.  Cardiomegaly Yale was recently spliced for cardiomegaly, pulmonary edema, he was seen [name], PA-C, I certainly think he needs a work-up for this, including an echocardiogram, I would like him to establish care with Dr. Everrett Coombe.    ___________________________________________ Melvin Rich. Benjamin Stain, M.D., ABFM., CAQSM. Primary Care and Sports Medicine [facility]  Adjunct Instructor of Family Medicine  [employer]

## 2019-11-16 NOTE — Assessment & Plan Note (Signed)
Melvin Rich was recently spliced for cardiomegaly, pulmonary edema, he was seen [name], PA-C, I certainly think he needs a work-up for this, including an echocardiogram, I would like him to establish care with Dr. Everrett Coombe.

## 2019-11-16 NOTE — Assessment & Plan Note (Signed)
Shammond returns, he is post ACDF, 2 level with Dr. Yevette Edwards in June 2020. His neck pain is much better but he continues to have paresthesias into both hands, he had seen Dr. Yevette Edwards who had recommended nerve conduction and EMG for further evaluation as to whether this represents carpal tunnel, cubital tunnel, or radiculitis. He never had this done so we will proceed with EMG nerve conduction with [facility], no medications for now. Follow-up with me after the nerve conduction study.

## 2019-11-18 ENCOUNTER — Other Ambulatory Visit: Payer: Self-pay

## 2019-11-18 ENCOUNTER — Ambulatory Visit (INDEPENDENT_AMBULATORY_CARE_PROVIDER_SITE_OTHER): Payer: Self-pay | Admitting: Family Medicine

## 2019-11-18 ENCOUNTER — Encounter: Payer: Self-pay | Admitting: Family Medicine

## 2019-11-18 VITALS — BP 150/97 | HR 99 | Temp 97.9°F | Ht 69.0 in | Wt 158.0 lb

## 2019-11-18 DIAGNOSIS — I10 Essential (primary) hypertension: Secondary | ICD-10-CM

## 2019-11-18 DIAGNOSIS — R0600 Dyspnea, unspecified: Secondary | ICD-10-CM

## 2019-11-18 DIAGNOSIS — I517 Cardiomegaly: Secondary | ICD-10-CM

## 2019-11-18 MED ORDER — VALSARTAN 320 MG PO TABS: 320.0000 mg | ORAL_TABLET | Freq: Every day | ORAL | 1 refills | Status: DC

## 2019-11-18 NOTE — Assessment & Plan Note (Signed)
Cardiomegaly noted on recent xray as well as elevated BNP and signs of pulmonary edema consistent with CHF.  Will have him restart valsartan. Continue furosemide at current dose.  Discussed monitoring for signs of fluid retention including increased weight, increasing sob or difficulty laying flat, or swelling of his legs.  ECHO ordered

## 2019-11-18 NOTE — Assessment & Plan Note (Signed)
Blood pressure is at goal at for age and co-morbidities.  I recommend that he restart valsartan.  In addition they were instructed to follow a low sodium diet with regular exercise to help to maintain adequate control of blood pressure.

## 2019-11-18 NOTE — Progress Notes (Signed)
Melvin Rich - 59 y.o. male MRN [id number]  Date of birth: [birth year]  Subjective No chief complaint on file.   HPI Melvin Rich is a 59 y.o. male with history of HTN, prior drug use, alcohol use and DDD s/p recent ACDF here today for f/u of recent urgent care/ER visit. He was seen in Urgent care for complaint of shortness of breath and referred to ED due cardiomegaly and abnormal EKG with RBBB.  In ED CXR suggestive of edema vs atypical infectious process. Labs significant for elevated BNP.  Normal WBC and negative troponin.  He was started on doxycycline as well as lasix.  He reports that dyspnea has improved some but he still feels fatigued.  He has stopped taking his blood pressure medication as he just didn't feel like taking it right now.  He denies chest pain, edema, dizziness, orthopna or palpitations at this time.   ROS:  A comprehensive ROS was completed and negative except as noted per HPI  No Known Allergies  Past Medical History:  Diagnosis Date  . Alcohol use disorder   . Chronic active hepatitis (HCC) 04/23/2017   Pt stated that he had the treatment  . Chronic hepatitis C virus infection (HCC) 04/23/2017  . Degenerative disc disease, cervical   . Degenerative disc disease, cervical   . History of drug use disorder    Heroin, IVDA  . 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 Hypertension   . Left arm pain   . 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 Rheumatoid arthritis (HCC)   . Seasonal allergies   . Substance abuse (HCC)   . Transaminitis 04/23/2017    Past Surgical History:  Procedure Laterality Date  . ANTERIOR CERVICAL DECOMP/DISCECTOMY FUSION N/A 02/17/2019   Procedure: ANTERIOR CERVICAL DECOMPRESSION FUSION, CERVICAL 5-6, CERVICAL 6-7 WITH INSTRUMENTATION AND ALLOGRAFT;  Surgeon: Estill Bamberg, MD;  Location: [facility] OR;  Service: Orthopedics;  Laterality: N/A;  . HIP SURGERY  2005  . SHOULDER SURGERY  1985    Social History   Socioeconomic  History  . Marital status: Married    Spouse name: Not on file  . Number of children: Not on file  . Years of education: Not on file  . Highest education level: Not on file  Occupational History  . Not on file  Tobacco Use  . Smoking status: Current Every Day Smoker    Packs/day: 0.50    Years: 47.00    Pack years: 23.50    Types: Cigarettes  . Smokeless tobacco: Never Used  Substance and Sexual Activity  . Alcohol use: Yes    Alcohol/week: 4.0 standard drinks    Types: 4 Cans of beer per week    Comment: daily beer  . Drug use: Not Currently    Types: Heroin    Comment: sober since 2004  . Sexual activity: Yes    Birth control/protection: None  Other Topics Concern  . Not on file  Social History Narrative  . Not on file   Social Determinants of Health   Financial Resource Strain:   . Difficulty of Paying Living Expenses:   Food Insecurity:   . Worried About Programme researcher, broadcasting/film/video in the Last Year:   . Barista in the Last Year:   Transportation Needs:   . Freight forwarder (Medical):   Marland Kitchen Lack of Transportation (Non-Medical):   Physical Activity:   . Days of Exercise per Week:   .  Minutes of Exercise per Session:   Stress:   . Feeling of Stress :   Social Connections:   . Frequency of Communication with Friends and Family:   . Frequency of Social Gatherings with Friends and Family:   . Attends Religious Services:   . Active Member of Clubs or Organizations:   . Attends Archivist Meetings:   Marland Kitchen Marital Status:     Family History  Adopted: Yes  Family history unknown: Yes    Health Maintenance  Topic Date Due  . TETANUS/TDAP  Never done  . INFLUENZA VACCINE  Never done  . Fecal DNA (Cologuard)  08/02/2020  . Hepatitis C Screening  Completed  . HIV Screening  Completed      ----------------------------------------------------------------------------------------------------------------------------------------------------------------------------------------------------------------- Physical Exam BP (!) 150/97   Pulse 99   Temp 97.9 F (36.6 C) (Oral)   Ht 5\' 9"  (1.753 m)   Wt 158 lb (71.7 kg)   BMI 23.33 kg/m   Physical Exam Constitutional:      Appearance: Normal appearance.  Eyes:     General: No scleral icterus. Cardiovascular:     Rate and Rhythm: Normal rate and regular rhythm.  Pulmonary:     Effort: Pulmonary effort is normal.     Breath sounds: Normal breath sounds.  Musculoskeletal:     Cervical back: Neck supple.     Right lower leg: No edema.     Left lower leg: No edema.  Skin:    General: Skin is warm and dry.  Neurological:     General: No focal deficit present.     Mental Status: He is alert.     ------------------------------------------------------------------------------------------------------------------------------------------------------------------------------------------------------------------- Assessment and Plan  Cardiomegaly Cardiomegaly noted on recent xray as well as elevated BNP and signs of pulmonary edema consistent with CHF.  Will have him restart valsartan. Continue furosemide at current dose.  Discussed monitoring for signs of fluid retention including increased weight, increasing sob or difficulty laying flat, or swelling of his legs.  ECHO ordered   Hypertension goal BP (blood pressure) < 130/80 Blood pressure is at goal at for age and co-morbidities.  I recommend that he restart valsartan.  In addition they were instructed to follow a low sodium diet with regular exercise to help to maintain adequate control of blood pressure.     Meds ordered this encounter  Medications  . valsartan (DIOVAN) 320 MG tablet    Sig: Take 1 tablet (320 mg total) by mouth daily.    Dispense:  90 tablet     Refill:  1    Return in about 4 weeks (around 12/16/2019) for HTN.    This visit occurred during the SARS-CoV-2 public health emergency.  Safety protocols were in place, including screening questions prior to the visit, additional usage of staff PPE, and extensive cleaning of exam room while observing appropriate contact time as indicated for disinfecting solutions.

## 2019-11-18 NOTE — Patient Instructions (Signed)
Continue furosemide and complete doxycycline.  Restart valartan daily.  I have ordered an echocardiogram.  You will be contacted for an appointment.  See me again in 1 month to follow up blood pressure.  We'll recheck labs at this visit as well.

## 2019-11-22 ENCOUNTER — Encounter: Payer: Self-pay | Admitting: Family Medicine

## 2019-11-25 ENCOUNTER — Other Ambulatory Visit: Payer: Self-pay | Admitting: Family Medicine

## 2019-11-25 ENCOUNTER — Ambulatory Visit (HOSPITAL_COMMUNITY): Payer: Self-pay | Attending: Cardiology

## 2019-11-25 ENCOUNTER — Other Ambulatory Visit: Payer: Self-pay

## 2019-11-25 DIAGNOSIS — R0600 Dyspnea, unspecified: Secondary | ICD-10-CM

## 2019-11-25 DIAGNOSIS — I517 Cardiomegaly: Secondary | ICD-10-CM

## 2019-11-25 DIAGNOSIS — I5021 Acute systolic (congestive) heart failure: Secondary | ICD-10-CM

## 2019-11-28 ENCOUNTER — Encounter: Payer: Self-pay | Admitting: Family Medicine

## 2019-11-29 ENCOUNTER — Encounter: Payer: Self-pay | Admitting: *Deleted

## 2019-11-30 ENCOUNTER — Encounter: Payer: Self-pay | Admitting: Cardiology

## 2019-11-30 ENCOUNTER — Ambulatory Visit (INDEPENDENT_AMBULATORY_CARE_PROVIDER_SITE_OTHER): Payer: Self-pay | Admitting: Cardiology

## 2019-11-30 ENCOUNTER — Other Ambulatory Visit: Payer: Self-pay

## 2019-11-30 VITALS — BP 120/90 | HR 84 | Ht 69.0 in | Wt 149.0 lb

## 2019-11-30 DIAGNOSIS — Z7289 Other problems related to lifestyle: Secondary | ICD-10-CM

## 2019-11-30 DIAGNOSIS — Z789 Other specified health status: Secondary | ICD-10-CM

## 2019-11-30 DIAGNOSIS — R9431 Abnormal electrocardiogram [ECG] [EKG]: Secondary | ICD-10-CM | POA: Insufficient documentation

## 2019-11-30 DIAGNOSIS — I420 Dilated cardiomyopathy: Secondary | ICD-10-CM | POA: Insufficient documentation

## 2019-11-30 DIAGNOSIS — Z720 Tobacco use: Secondary | ICD-10-CM

## 2019-11-30 DIAGNOSIS — R0989 Other specified symptoms and signs involving the circulatory and respiratory systems: Secondary | ICD-10-CM

## 2019-11-30 DIAGNOSIS — I444 Left anterior fascicular block: Secondary | ICD-10-CM | POA: Insufficient documentation

## 2019-11-30 DIAGNOSIS — I10 Essential (primary) hypertension: Secondary | ICD-10-CM

## 2019-11-30 DIAGNOSIS — I4510 Unspecified right bundle-branch block: Secondary | ICD-10-CM | POA: Insufficient documentation

## 2019-11-30 DIAGNOSIS — F1090 Alcohol use, unspecified, uncomplicated: Secondary | ICD-10-CM | POA: Insufficient documentation

## 2019-11-30 MED ORDER — POTASSIUM CHLORIDE ER 20 MEQ PO TBCR: EXTENDED_RELEASE_TABLET | ORAL | 3 refills | Status: DC

## 2019-11-30 MED ORDER — SPIRONOLACTONE 25 MG PO TABS: 12.5000 mg | ORAL_TABLET | Freq: Every day | ORAL | 6 refills | Status: DC

## 2019-11-30 MED ORDER — METOPROLOL SUCCINATE ER 25 MG PO TB24: 12.5000 mg | ORAL_TABLET | Freq: Every day | ORAL | 6 refills | Status: DC

## 2019-11-30 MED ORDER — FUROSEMIDE 20 MG PO TABS: ORAL_TABLET | ORAL | Status: DC

## 2019-11-30 NOTE — H&P (View-Only) (Signed)
Cardiology Office Note:    Date:  11/30/2019   ID:  Melvin Rich, DOB 1961/07/09, MRN [id number]  PCP:  Everrett Coombe, DO  Cardiologist:  No primary care provider on file.  Electrophysiologist:  None   Referring MD: Everrett Coombe, DO   Chief Complaint  Patient presents with  . Shortness of Breath    History of Present Illness:    Melvin Rich is a 59 y.o. male with a hx of hypertension, depressed ejection fraction EF 25 to 30% on echocardiogram done on November 25, 2019, tobacco use, and heavy alcohol use.  The patient reports that he first presented to his PCP due to shortness of breath.  He tells me that at the end of his visit he was given Lasix 20 mg daily.  He states that this medication has not helped and his shortness of breath is getting worse.  He reports that with minimal exertion he gets short of breath and this is affecting his day-to-day activity especially his work as he cannot perform his duties due to shortness of breath.    He denies any chest pain, lightheadedness, dizziness.  Past Medical History:  Diagnosis Date  . Alcohol use disorder   . BPPV (benign paroxysmal positional vertigo), right 07/27/2018  . Cardiomegaly 11/16/2019  . Chronic active hepatitis (HCC) 04/23/2017   Pt stated that he had the treatment  . Chronic hepatitis C virus infection (HCC) 04/23/2017  . DDD (degenerative disc disease), cervical 04/18/2017   Post C5-C7 ACDF with Dr. Yevette Edwards June 2020  . Degenerative disc disease, cervical   . Degenerative disc disease, cervical   . Eustachian tube dysfunction, bilateral 02/23/2018  . Hearing loss of left ear 07/20/2018  . History of drug use disorder    Heroin, IVDA  . 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 Hypertension   . Hypertension goal BP (blood pressure) < 130/80 04/18/2017  . Left arm pain   . 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 Poor dentition 05/05/2017  . Radiculopathy 02/17/2019  . Rheumatoid arthritis (HCC)   .  Rheumatoid arthritis involving both elbows (HCC) 04/18/2017  . Seasonal allergies   . 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 Substance abuse (HCC)   . Transaminitis 04/23/2017  . Trigger thumb, left thumb 03/22/2019    Past Surgical History:  Procedure Laterality Date  . ANTERIOR CERVICAL DECOMP/DISCECTOMY FUSION N/A 02/17/2019   Procedure: ANTERIOR CERVICAL DECOMPRESSION FUSION, CERVICAL 5-6, CERVICAL 6-7 WITH INSTRUMENTATION AND ALLOGRAFT;  Surgeon: Estill Bamberg, MD;  Location: [facility] OR;  Service: Orthopedics;  Laterality: N/A;  . HIP SURGERY  2005  . SHOULDER SURGERY  1985    Current Medications: Current Meds  Medication Sig  . furosemide (LASIX) 20 MG tablet Take by mouth.  . Omega-3 Fatty Acids (FISH OIL PO) Take by mouth.  . valsartan (DIOVAN) 320 MG tablet Take 1 tablet (320 mg total) by mouth daily.     Allergies:   Patient has no known allergies.   Social History   Socioeconomic History  . Marital status: Married    Spouse name: Not on file  . Number of children: Not on file  . Years of education: Not on file  . Highest education level: Not on file  Occupational History  . Not on file  Tobacco Use  . Smoking status: Current Every Day Smoker    Packs/day: 0.50    Years: 47.00    Pack years: 23.50    Types: Cigarettes  .  Smokeless tobacco: Never Used  Substance and Sexual Activity  . Alcohol use: Yes    Alcohol/week: 14.0 standard drinks    Types: 14 Cans of beer per week    Comment: "drinks alot"  . Drug use: Not Currently    Types: Heroin    Comment: sober since 2004  . Sexual activity: Yes    Birth control/protection: None  Other Topics Concern  . Not on file  Social History Narrative  . Not on file   Social Determinants of Health   Financial Resource Strain:   . Difficulty of Paying Living Expenses:   Food Insecurity:   . Worried About Programme researcher, broadcasting/film/video in the Last Year:   . Barista in the Last Year:   Transportation Needs:   . Automotive engineer (Medical):   Marland Kitchen Lack of Transportation (Non-Medical):   Physical Activity:   . Days of Exercise per Week:   . Minutes of Exercise per Session:   Stress:   . Feeling of Stress :   Social Connections:   . Frequency of Communication with Friends and Family:   . Frequency of Social Gatherings with Friends and Family:   . Attends Religious Services:   . Active Member of Clubs or Organizations:   . Attends Banker Meetings:   Marland Kitchen Marital Status:      Family History: The patient's He was adopted. Family history is unknown by patient.  ROS:   Review of Systems  Constitution: Negative for decreased appetite, fever and weight gain.  HENT: Negative for congestion, ear discharge, hoarse voice and sore throat.   Eyes: Negative for discharge, redness, vision loss in right eye and visual halos.  Cardiovascular: Reports shortness of breath on exertion.  Negative for chest pain, dyspnea on exertion, leg swelling, orthopnea and palpitations.  Respiratory: Negative for cough, hemoptysis, shortness of breath and snoring.   Endocrine: Negative for heat intolerance and polyphagia.  Hematologic/Lymphatic: Negative for bleeding problem. Does not bruise/bleed easily.  Skin: Negative for flushing, nail changes, rash and suspicious lesions.  Musculoskeletal: Negative for arthritis, joint pain, muscle cramps, myalgias, neck pain and stiffness.  Gastrointestinal: Negative for abdominal pain, bowel incontinence, diarrhea and excessive appetite.  Genitourinary: Negative for decreased libido, genital sores and incomplete emptying.  Neurological: Negative for brief paralysis, focal weakness, headaches and loss of balance.  Psychiatric/Behavioral: Negative for altered mental status, depression and suicidal ideas.  Allergic/Immunologic: Negative for HIV exposure and persistent infections.    EKGs/Labs/Other Studies Reviewed:    The following studies were reviewed today:   EKG:  The  ekg ordered today demonstrates sinus rhythm, heart rate 89 bpm, underlying right bundle branch block with left anterior fascicular block T wave inversion in inferior lateral leads suggestive of myocardial ischemia.  EKG done in November 15, 2019 its similar however EKG done in February 14, 2019 T wave inversion in the inferolateral leads are new, right bundle branch block is also new.  TTE 11/25/2019 IMPRESSIONS  1. Left ventricular ejection fraction, by estimation, is 25 to 30%. The  left ventricle has severely decreased function. The left ventricle  demonstrates global hypokinesis. The left ventricular internal cavity size  was mildly dilated. Left ventricular  diastolic function could not be evaluated. The average left ventricular  global longitudinal strain is -9.9 %.  2. Right ventricular systolic function is normal. The right ventricular  size is normal.  3. The mitral valve is normal in structure. Mild mitral valve  regurgitation. No evidence of mitral stenosis.  4. The aortic valve is tricuspid. Aortic valve regurgitation is not  visualized. Mild to moderate aortic valve sclerosis/calcification is  present, without any evidence of aortic stenosis.  5. The inferior vena cava is normal in size with greater than 50%  respiratory variability, suggesting right atrial pressure of 3 mmHg.    Recent Labs: 02/14/2019: ALT 29; BUN 14; Creatinine, Ser 0.63; Hemoglobin 13.2; Platelets 213; Potassium 4.1; Sodium 135  Recent Lipid Panel    Component Value Date/Time   CHOL 153 07/23/2018 0802   TRIG 68 07/23/2018 0802   HDL 62 07/23/2018 0802   CHOLHDL 2.5 07/23/2018 0802   LDLCALC 77 07/23/2018 0802    Physical Exam:    VS:  BP 120/90 (BP Location: Left Arm, Patient Position: Sitting, Cuff Size: Normal)   Pulse 84   Ht 5\' 9"  (1.753 m)   Wt 149 lb (67.6 kg)   SpO2 98%   BMI 22.00 kg/m     Wt Readings from Last 3 Encounters:  11/30/19 149 lb (67.6 kg)  11/18/19 158 lb (71.7 kg)    11/15/19 158 lb 11.7 oz (72 kg)    GEN: Well nourished, well developed in no acute distress HEENT: Normal NECK: No JVD; No carotid bruits LYMPHATICS: No lymphadenopathy CARDIAC: S1S2 noted,RRR, no murmurs, rubs, gallops RESPIRATORY:  Clear to auscultation without rales, wheezing or rhonchi  ABDOMEN: Soft, non-tender, non-distended, +bowel sounds, no guarding. EXTREMITIES: No edema, No cyanosis, no clubbing MUSCULOSKELETAL:  No deformity  SKIN: Warm and dry NEUROLOGIC:  Alert and oriented x 3, non-focal PSYCHIATRIC:  Normal affect, good insight  ASSESSMENT:    1. Dilated cardiomyopathy ([place])   2. Depressed left ventricular ejection fraction   3. Hypertension goal BP (blood pressure) < 130/80   4. Tobacco use   5. Alcohol use   6. Heavy alcohol consumption   7. Abnormal EKG   8. Right bundle branch block   9. LAFB (left anterior fascicular block)    PLAN:    Dilated cardiomyopathy-ejection fraction is 25 to 30% on echocardiogram done last week.  For now I like to rule out ischemic etiology of his depressed ejection fraction therefore I will send the patient for a right and left heart cath.  I have educated patient about this testing.  The patient understands that risks include but are not limited to stroke (1 in 1000), death (1 in 64), kidney failure [usually temporary] (1 in 500), bleeding (1 in 200), allergic reaction [possibly serious] (1 in 200), and agrees to proceed.  Depressed left ventricle ejection fraction 25 to 30%-he is on valsartan 320 mg daily, I am going to add for XL 12.5 mg as well as Aldactone 12.5 mg daily.  I am hoping understand more about his ejection fraction.  With the above left heart catheterization to rule out ischemic etiology.  In the meantime we will add Farxiga hopefully at his next visit.  Heart failure with reduced ejection fraction-he appears to be well compensated. His lasix will be switched from daily to 3 times a week.  I am also going to add  potassium supplements for the patient to take with his Lasix doses.   Abnormal EKG with T wave inversion concerning of inferior lateral wall ischemia.  Plan as listed above with cardiac catheterization.  Blood work will be done today to assess CMP, mag.  Lipid profile will also be done before his next visit.  Alcohol consumption-he reports heavy alcohol use  drinking 6-12 beers daily.  I have also educated the patient that his increased alcohol use will be detrimental to his heart.  He states that he is considering cutting back and I advised him that maybe outpatient rehab will help do this safely.  Tobacco use-the patient was counseled on tobacco cessation today for 5 minutes.  Counseling included reviewing the risks of smoking tobacco products, how it impacts the patient's current medical diagnoses and different strategies for quitting.     Hypertension-still with some diastolic hypertension.  Hoping the addition of his current medical regimen will help with this.  The patient is in agreement with the above plan. The patient left the office in stable condition.  The patient will follow up in 1 month or sooner if needed  Medication Adjustments/Labs and Tests Ordered: Current medicines are reviewed at length with the patient today.  Concerns regarding medicines are outlined above.  No orders of the defined types were placed in this encounter.  No orders of the defined types were placed in this encounter.   There are no Patient Instructions on file for this visit.   Adopting a Healthy Lifestyle.  Know what a healthy weight is for you (roughly BMI <25) and aim to maintain this   Aim for 7+ servings of fruits and vegetables daily   65-80+ fluid ounces of water or unsweet tea for healthy kidneys   Limit to max 1 drink of alcohol per day; avoid smoking/tobacco   Limit animal fats in diet for cholesterol and heart health - choose grass fed whenever available   Avoid highly processed  foods, and foods high in saturated/trans fats   Aim for low stress - take time to unwind and care for your mental health   Aim for 150 min of moderate intensity exercise weekly for heart health, and weights twice weekly for bone health   Aim for 7-9 hours of sleep daily   When it comes to diets, agreement about the perfect plan isnt easy to find, even among the experts. Experts at the [employer] developed an idea known as the Healthy Eating Plate. Just imagine a plate divided into logical, healthy portions.   The emphasis is on diet quality:   Load up on vegetables and fruits - one-half of your plate: Aim for color and variety, and remember that potatoes dont count.   Go for whole grains - one-quarter of your plate: Whole wheat, barley, wheat berries, quinoa, oats, brown rice, and foods made with them. If you want pasta, go with whole wheat pasta.   Protein power - one-quarter of your plate: Fish, chicken, beans, and nuts are all healthy, versatile protein sources. Limit red meat.   The diet, however, does go beyond the plate, offering a few other suggestions.   Use healthy plant oils, such as olive, canola, soy, corn, sunflower and peanut. Check the labels, and avoid partially hydrogenated oil, which have unhealthy trans fats.   If youre thirsty, drink water. Coffee and tea are good in moderation, but skip sugary drinks and limit milk and dairy products to one or two daily servings.   The type of carbohydrate in the diet is more important than the amount. Some sources of carbohydrates, such as vegetables, fruits, whole grains, and beans-are healthier than others.   Finally, stay active  Signed, Ameen Mostafa, DO  11/30/2019 10:55 AM    [facility] 

## 2019-11-30 NOTE — Progress Notes (Signed)
Cardiology Office Note:    Date:  11/30/2019   ID:  Melvin Rich, DOB 1961/07/09, MRN [id number]  PCP:  Everrett Coombe, DO  Cardiologist:  No primary care provider on file.  Electrophysiologist:  None   Referring MD: Everrett Coombe, DO   Chief Complaint  Patient presents with  . Shortness of Breath    History of Present Illness:    Melvin Rich is a 59 y.o. male with a hx of hypertension, depressed ejection fraction EF 25 to 30% on echocardiogram done on November 25, 2019, tobacco use, and heavy alcohol use.  The patient reports that he first presented to his PCP due to shortness of breath.  He tells me that at the end of his visit he was given Lasix 20 mg daily.  He states that this medication has not helped and his shortness of breath is getting worse.  He reports that with minimal exertion he gets short of breath and this is affecting his day-to-day activity especially his work as he cannot perform his duties due to shortness of breath.    He denies any chest pain, lightheadedness, dizziness.  Past Medical History:  Diagnosis Date  . Alcohol use disorder   . BPPV (benign paroxysmal positional vertigo), right 07/27/2018  . Cardiomegaly 11/16/2019  . Chronic active hepatitis (HCC) 04/23/2017   Pt stated that he had the treatment  . Chronic hepatitis C virus infection (HCC) 04/23/2017  . DDD (degenerative disc disease), cervical 04/18/2017   Post C5-C7 ACDF with Dr. Yevette Edwards June 2020  . Degenerative disc disease, cervical   . Degenerative disc disease, cervical   . Eustachian tube dysfunction, bilateral 02/23/2018  . Hearing loss of left ear 07/20/2018  . History of drug use disorder    Heroin, IVDA  . 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 Hypertension   . Hypertension goal BP (blood pressure) < 130/80 04/18/2017  . Left arm pain   . 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 Poor dentition 05/05/2017  . Radiculopathy 02/17/2019  . Rheumatoid arthritis (HCC)   .  Rheumatoid arthritis involving both elbows (HCC) 04/18/2017  . Seasonal allergies   . 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 Substance abuse (HCC)   . Transaminitis 04/23/2017  . Trigger thumb, left thumb 03/22/2019    Past Surgical History:  Procedure Laterality Date  . ANTERIOR CERVICAL DECOMP/DISCECTOMY FUSION N/A 02/17/2019   Procedure: ANTERIOR CERVICAL DECOMPRESSION FUSION, CERVICAL 5-6, CERVICAL 6-7 WITH INSTRUMENTATION AND ALLOGRAFT;  Surgeon: Estill Bamberg, MD;  Location: [facility] OR;  Service: Orthopedics;  Laterality: N/A;  . HIP SURGERY  2005  . SHOULDER SURGERY  1985    Current Medications: Current Meds  Medication Sig  . furosemide (LASIX) 20 MG tablet Take by mouth.  . Omega-3 Fatty Acids (FISH OIL PO) Take by mouth.  . valsartan (DIOVAN) 320 MG tablet Take 1 tablet (320 mg total) by mouth daily.     Allergies:   Patient has no known allergies.   Social History   Socioeconomic History  . Marital status: Married    Spouse name: Not on file  . Number of children: Not on file  . Years of education: Not on file  . Highest education level: Not on file  Occupational History  . Not on file  Tobacco Use  . Smoking status: Current Every Day Smoker    Packs/day: 0.50    Years: 47.00    Pack years: 23.50    Types: Cigarettes  .  Smokeless tobacco: Never Used  Substance and Sexual Activity  . Alcohol use: Yes    Alcohol/week: 14.0 standard drinks    Types: 14 Cans of beer per week    Comment: "drinks alot"  . Drug use: Not Currently    Types: Heroin    Comment: sober since 2004  . Sexual activity: Yes    Birth control/protection: None  Other Topics Concern  . Not on file  Social History Narrative  . Not on file   Social Determinants of Health   Financial Resource Strain:   . Difficulty of Paying Living Expenses:   Food Insecurity:   . Worried About Programme researcher, broadcasting/film/video in the Last Year:   . Barista in the Last Year:   Transportation Needs:   . Automotive engineer (Medical):   Marland Kitchen Lack of Transportation (Non-Medical):   Physical Activity:   . Days of Exercise per Week:   . Minutes of Exercise per Session:   Stress:   . Feeling of Stress :   Social Connections:   . Frequency of Communication with Friends and Family:   . Frequency of Social Gatherings with Friends and Family:   . Attends Religious Services:   . Active Member of Clubs or Organizations:   . Attends Banker Meetings:   Marland Kitchen Marital Status:      Family History: The patient's He was adopted. Family history is unknown by patient.  ROS:   Review of Systems  Constitution: Negative for decreased appetite, fever and weight gain.  HENT: Negative for congestion, ear discharge, hoarse voice and sore throat.   Eyes: Negative for discharge, redness, vision loss in right eye and visual halos.  Cardiovascular: Reports shortness of breath on exertion.  Negative for chest pain, dyspnea on exertion, leg swelling, orthopnea and palpitations.  Respiratory: Negative for cough, hemoptysis, shortness of breath and snoring.   Endocrine: Negative for heat intolerance and polyphagia.  Hematologic/Lymphatic: Negative for bleeding problem. Does not bruise/bleed easily.  Skin: Negative for flushing, nail changes, rash and suspicious lesions.  Musculoskeletal: Negative for arthritis, joint pain, muscle cramps, myalgias, neck pain and stiffness.  Gastrointestinal: Negative for abdominal pain, bowel incontinence, diarrhea and excessive appetite.  Genitourinary: Negative for decreased libido, genital sores and incomplete emptying.  Neurological: Negative for brief paralysis, focal weakness, headaches and loss of balance.  Psychiatric/Behavioral: Negative for altered mental status, depression and suicidal ideas.  Allergic/Immunologic: Negative for HIV exposure and persistent infections.    EKGs/Labs/Other Studies Reviewed:    The following studies were reviewed today:   EKG:  The  ekg ordered today demonstrates sinus rhythm, heart rate 89 bpm, underlying right bundle branch block with left anterior fascicular block T wave inversion in inferior lateral leads suggestive of myocardial ischemia.  EKG done in November 15, 2019 its similar however EKG done in February 14, 2019 T wave inversion in the inferolateral leads are new, right bundle branch block is also new.  TTE 11/25/2019 IMPRESSIONS  1. Left ventricular ejection fraction, by estimation, is 25 to 30%. The  left ventricle has severely decreased function. The left ventricle  demonstrates global hypokinesis. The left ventricular internal cavity size  was mildly dilated. Left ventricular  diastolic function could not be evaluated. The average left ventricular  global longitudinal strain is -9.9 %.  2. Right ventricular systolic function is normal. The right ventricular  size is normal.  3. The mitral valve is normal in structure. Mild mitral valve  regurgitation. No evidence of mitral stenosis.  4. The aortic valve is tricuspid. Aortic valve regurgitation is not  visualized. Mild to moderate aortic valve sclerosis/calcification is  present, without any evidence of aortic stenosis.  5. The inferior vena cava is normal in size with greater than 50%  respiratory variability, suggesting right atrial pressure of 3 mmHg.    Recent Labs: 02/14/2019: ALT 29; BUN 14; Creatinine, Ser 0.63; Hemoglobin 13.2; Platelets 213; Potassium 4.1; Sodium 135  Recent Lipid Panel    Component Value Date/Time   CHOL 153 07/23/2018 0802   TRIG 68 07/23/2018 0802   HDL 62 07/23/2018 0802   CHOLHDL 2.5 07/23/2018 0802   LDLCALC 77 07/23/2018 0802    Physical Exam:    VS:  BP 120/90 (BP Location: Left Arm, Patient Position: Sitting, Cuff Size: Normal)   Pulse 84   Ht 5\' 9"  (1.753 m)   Wt 149 lb (67.6 kg)   SpO2 98%   BMI 22.00 kg/m     Wt Readings from Last 3 Encounters:  11/30/19 149 lb (67.6 kg)  11/18/19 158 lb (71.7 kg)    11/15/19 158 lb 11.7 oz (72 kg)    GEN: Well nourished, well developed in no acute distress HEENT: Normal NECK: No JVD; No carotid bruits LYMPHATICS: No lymphadenopathy CARDIAC: S1S2 noted,RRR, no murmurs, rubs, gallops RESPIRATORY:  Clear to auscultation without rales, wheezing or rhonchi  ABDOMEN: Soft, non-tender, non-distended, +bowel sounds, no guarding. EXTREMITIES: No edema, No cyanosis, no clubbing MUSCULOSKELETAL:  No deformity  SKIN: Warm and dry NEUROLOGIC:  Alert and oriented x 3, non-focal PSYCHIATRIC:  Normal affect, good insight  ASSESSMENT:    1. Dilated cardiomyopathy ([place])   2. Depressed left ventricular ejection fraction   3. Hypertension goal BP (blood pressure) < 130/80   4. Tobacco use   5. Alcohol use   6. Heavy alcohol consumption   7. Abnormal EKG   8. Right bundle branch block   9. LAFB (left anterior fascicular block)    PLAN:    Dilated cardiomyopathy-ejection fraction is 25 to 30% on echocardiogram done last week.  For now I like to rule out ischemic etiology of his depressed ejection fraction therefore I will send the patient for a right and left heart cath.  I have educated patient about this testing.  The patient understands that risks include but are not limited to stroke (1 in 1000), death (1 in 64), kidney failure [usually temporary] (1 in 500), bleeding (1 in 200), allergic reaction [possibly serious] (1 in 200), and agrees to proceed.  Depressed left ventricle ejection fraction 25 to 30%-he is on valsartan 320 mg daily, I am going to add for XL 12.5 mg as well as Aldactone 12.5 mg daily.  I am hoping understand more about his ejection fraction.  With the above left heart catheterization to rule out ischemic etiology.  In the meantime we will add Farxiga hopefully at his next visit.  Heart failure with reduced ejection fraction-he appears to be well compensated. His lasix will be switched from daily to 3 times a week.  I am also going to add  potassium supplements for the patient to take with his Lasix doses.   Abnormal EKG with T wave inversion concerning of inferior lateral wall ischemia.  Plan as listed above with cardiac catheterization.  Blood work will be done today to assess CMP, mag.  Lipid profile will also be done before his next visit.  Alcohol consumption-he reports heavy alcohol use  drinking 6-12 beers daily.  I have also educated the patient that his increased alcohol use will be detrimental to his heart.  He states that he is considering cutting back and I advised him that maybe outpatient rehab will help do this safely.  Tobacco use-the patient was counseled on tobacco cessation today for 5 minutes.  Counseling included reviewing the risks of smoking tobacco products, how it impacts the patient's current medical diagnoses and different strategies for quitting.     Hypertension-still with some diastolic hypertension.  Hoping the addition of his current medical regimen will help with this.  The patient is in agreement with the above plan. The patient left the office in stable condition.  The patient will follow up in 1 month or sooner if needed  Medication Adjustments/Labs and Tests Ordered: Current medicines are reviewed at length with the patient today.  Concerns regarding medicines are outlined above.  No orders of the defined types were placed in this encounter.  No orders of the defined types were placed in this encounter.   There are no Patient Instructions on file for this visit.   Adopting a Healthy Lifestyle.  Know what a healthy weight is for you (roughly BMI <25) and aim to maintain this   Aim for 7+ servings of fruits and vegetables daily   65-80+ fluid ounces of water or unsweet tea for healthy kidneys   Limit to max 1 drink of alcohol per day; avoid smoking/tobacco   Limit animal fats in diet for cholesterol and heart health - choose grass fed whenever available   Avoid highly processed  foods, and foods high in saturated/trans fats   Aim for low stress - take time to unwind and care for your mental health   Aim for 150 min of moderate intensity exercise weekly for heart health, and weights twice weekly for bone health   Aim for 7-9 hours of sleep daily   When it comes to diets, agreement about the perfect plan isnt easy to find, even among the experts. Experts at the [facility] of [employer] developed an idea known as the Healthy Eating Plate. Just imagine a plate divided into logical, healthy portions.   The emphasis is on diet quality:   Load up on vegetables and fruits - one-half of your plate: Aim for color and variety, and remember that potatoes dont count.   Go for whole grains - one-quarter of your plate: Whole wheat, barley, wheat berries, quinoa, oats, brown rice, and foods made with them. If you want pasta, go with whole wheat pasta.   Protein power - one-quarter of your plate: Fish, chicken, beans, and nuts are all healthy, versatile protein sources. Limit red meat.   The diet, however, does go beyond the plate, offering a few other suggestions.   Use healthy plant oils, such as olive, canola, soy, corn, sunflower and peanut. Check the labels, and avoid partially hydrogenated oil, which have unhealthy trans fats.   If youre thirsty, drink water. Coffee and tea are good in moderation, but skip sugary drinks and limit milk and dairy products to one or two daily servings.   The type of carbohydrate in the diet is more important than the amount. Some sources of carbohydrates, such as vegetables, fruits, whole grains, and beans-are healthier than others.   Finally, stay active  Signed, Thomasene Ripple, DO  11/30/2019 10:55 AM    [facility]

## 2019-11-30 NOTE — Addendum Note (Signed)
Addended by: Thomasene Ripple on: 11/30/2019 04:42 PM   Modules accepted: Orders, SmartSet

## 2019-11-30 NOTE — Patient Instructions (Signed)
Medication Instructions:  Your physician has recommended you make the following change in your medication:   Start Aldactatone 12. 5 mg daily Take Lasix only on Tuesday, Thursday and Saturdays Take KCL 20 MEQ on Tuesday, Thursday and Saturdays Start Toprol XL 12.5 mg daily    *If you need a refill on your cardiac medications before your next appointment, please call your pharmacy*   Lab Work:  Today you had a BMET, Mg and LFT. In the future you need to have your Lipids checked. Your physician recommends that you return for lab work in: before your next appointment. You need to be fasting for this lab.  You can come Monday through Friday 8:30 am to 12:00 pm and 1:15 to 4:30. You do not need to make an appointment as the order has already been placed.   If you have labs (blood work) drawn today and your tests are completely normal, you will receive your results only by: Marland Kitchen MyChart Message (if you have MyChart) OR . A paper copy in the mail If you have any lab test that is abnormal or we need to change your treatment, we will call you to review the results.   Testing/Procedures:    [facility] [place] [address], [address] [place] [address] Dept: [phone] Loc: [phone]  JAVARIUS TSOSIE  11/30/2019  You are scheduled for a Cardiac Catheterization on Wednesday, April 8 with Dr. Bryan Lemma.  1. Please arrive at the [facility] (Main Entrance A) at [facility]: [address] [place], [place] [address] at 5:30 AM (This time is two hours before your procedure to ensure your preparation). Free valet parking service is available.   Special note: Every effort is made to have your procedure done on time. Please understand that emergencies sometimes delay scheduled procedures.  2. Diet: Do not eat solid foods after midnight.  The patient may have clear liquids until 5am upon the day of  the procedure.  3. Labs: You had your labs done today. You need to go and have your COVID screening on 12/06/19.  4. Medication instructions in preparation for your procedure:   Contrast Allergy: No    Current Outpatient Medications (Cardiovascular):  .  furosemide (LASIX) 20 MG tablet, Take by mouth. .  valsartan (DIOVAN) 320 MG tablet, Take 1 tablet (320 mg total) by mouth daily.     Current Outpatient Medications (Other):  Marland Kitchen  Omega-3 Fatty Acids (FISH OIL PO), Take by mouth. *For reference purposes while preparing patient instructions.   Delete this med list prior to printing instructions for patient.*  Stop taking, Diovan (Valsartan) Tuesday, April 7,, Lasix (Furosemide)  Tuesday, April 7,     On the morning of your procedure, take your Aspirin and any morning medicines NOT listed above.  You may use sips of water.  5. Plan for one night stay--bring personal belongings. 6. Bring a current list of your medications and current insurance cards. 7. You MUST have a responsible person to drive you home. 8. Someone MUST be with you the first 24 hours after you arrive home or your discharge will be delayed. 9. Please wear clothes that are easy to get on and off and wear slip-on shoes.  Thank you for allowing [place] to care for you!   -- [facility]   Follow-Up: At [facility], you and your health needs are our priority.  As part  of our continuing mission to provide you with exceptional heart care, we have created designated Provider Care Teams.  These Care Teams include your primary Cardiologist (physician) and Advanced Practice Providers (APPs -  Physician Assistants and Nurse Practitioners) who all work together to provide you with the care you need, when you need it.  We recommend signing up for the patient portal called "MyChart".  Sign up information is provided on this After Visit Summary.  MyChart is used to connect with patients for Virtual  Visits (Telemedicine).  Patients are able to view lab/test results, encounter notes, upcoming appointments, etc.  Non-urgent messages can be sent to your provider as well.   To learn more about what you can do with MyChart, go to [email or web address].    Your next appointment:   1 month(s)  The format for your next appointment:   In Person  Provider:   Berniece Salines, DO   Other Instructions NA

## 2019-12-01 LAB — HEPATIC FUNCTION PANEL
ALT: 17 IU/L (ref 0–44)
AST: 24 IU/L (ref 0–40)
Albumin: 4.4 g/dL (ref 3.8–4.9)
Alkaline Phosphatase: 194 IU/L — ABNORMAL HIGH (ref 39–117)
Bilirubin Total: 1.2 mg/dL (ref 0.0–1.2)
Bilirubin, Direct: 0.29 mg/dL (ref 0.00–0.40)
Total Protein: 6.8 g/dL (ref 6.0–8.5)

## 2019-12-01 LAB — BASIC METABOLIC PANEL
BUN/Creatinine Ratio: 23 — ABNORMAL HIGH (ref 9–20)
BUN: 17 mg/dL (ref 6–24)
CO2: 21 mmol/L (ref 20–29)
Calcium: 9.5 mg/dL (ref 8.7–10.2)
Chloride: 101 mmol/L (ref 96–106)
Creatinine, Ser: 0.73 mg/dL — ABNORMAL LOW (ref 0.76–1.27)
GFR calc Af Amer: 118 mL/min/{1.73_m2} (ref >59)
GFR calc non Af Amer: 102 mL/min/{1.73_m2} (ref >59)
Glucose: 97 mg/dL (ref 65–99)
Potassium: 4.4 mmol/L (ref 3.5–5.2)
Sodium: 134 mmol/L (ref 134–144)

## 2019-12-01 LAB — MAGNESIUM: Magnesium: 1.8 mg/dL (ref 1.6–2.3)

## 2019-12-02 ENCOUNTER — Encounter: Payer: Self-pay | Admitting: Family Medicine

## 2019-12-06 ENCOUNTER — Telehealth: Payer: Self-pay | Admitting: *Deleted

## 2019-12-06 ENCOUNTER — Other Ambulatory Visit (HOSPITAL_COMMUNITY)
Admission: RE | Admit: 2019-12-06 | Discharge: 2019-12-06 | Disposition: A | Discharge status: Home / Self Care | Payer: HRSA Program | Source: Ambulatory Visit | Attending: Cardiology | Admitting: Cardiology

## 2019-12-06 DIAGNOSIS — Z20822 Contact with and (suspected) exposure to covid-19: Secondary | ICD-10-CM | POA: Insufficient documentation

## 2019-12-06 DIAGNOSIS — Z01812 Encounter for preprocedural laboratory examination: Secondary | ICD-10-CM | POA: Diagnosis present

## 2019-12-06 LAB — SARS CORONAVIRUS 2 (TAT 6-24 HRS): SARS Coronavirus 2: NEGATIVE

## 2019-12-06 NOTE — Telephone Encounter (Signed)
Pt contacted pre-catheterization scheduled at [facility] for: Thursday December 08, 2019 7:30 AM Verified arrival time and place: [facility] Main Entrance A [facility]) at: 5:30 AM   No solid food after midnight prior to cath, clear liquids until 5 AM day of procedure.  Hold: Spironolactone-AM of procedure Lasix/KCl- AM of procedure  Except hold medications AM meds can be  taken pre-cath with sip of water including: ASA 81 mg   Confirmed patient has responsible adult to drive home post procedure and observe 24 hours after arriving home: yes  Currently, due to Covid-19 pandemic, only one person will be allowed with patient. Must be the same person for patient's entire stay and will be required to wear a mask. They will be asked to wait in the waiting room for the duration of the patient's stay.  Patients are required to wear a mask when they enter the hospital.      COVID-19 Pre-Screening Questions:  . In the past 7 to 10 days have you had a cough,  shortness of breath, headache, congestion, fever (100 or greater) body aches, chills, sore throat, or sudden loss of taste or sense of smell? no . Have you been around anyone with known Covid 19 in the past 7-10 days? no . Have you been around anyone who is awaiting Covid 19 test results in the past 7 to 10 days? no . Have you been around anyone who has mentioned symptoms of Covid 19 within the past 7 to 10 days? no   Reviewed procedure/mask/visitor instructions, COVID-19 screening questions with patient.  CBC 11/15/19 Care Everywhere.

## 2019-12-08 ENCOUNTER — Ambulatory Visit (HOSPITAL_COMMUNITY)
Admission: RE | Admit: 2019-12-08 | Discharge: 2019-12-08 | Disposition: A | Discharge status: Home / Self Care | Payer: Self-pay | Attending: Cardiology | Admitting: Cardiology

## 2019-12-08 ENCOUNTER — Other Ambulatory Visit: Payer: Self-pay

## 2019-12-08 ENCOUNTER — Encounter (HOSPITAL_COMMUNITY)
Admission: RE | Disposition: A | Discharge status: Home / Self Care | Payer: Self-pay | Source: Home / Self Care | Attending: Cardiology

## 2019-12-08 DIAGNOSIS — H9192 Unspecified hearing loss, left ear: Secondary | ICD-10-CM | POA: Insufficient documentation

## 2019-12-08 DIAGNOSIS — Z79899 Other long term (current) drug therapy: Secondary | ICD-10-CM | POA: Insufficient documentation

## 2019-12-08 DIAGNOSIS — I5042 Chronic combined systolic (congestive) and diastolic (congestive) heart failure: Secondary | ICD-10-CM | POA: Insufficient documentation

## 2019-12-08 DIAGNOSIS — I420 Dilated cardiomyopathy: Secondary | ICD-10-CM | POA: Insufficient documentation

## 2019-12-08 DIAGNOSIS — I110 Hypertensive heart disease with heart failure: Secondary | ICD-10-CM | POA: Insufficient documentation

## 2019-12-08 DIAGNOSIS — F1090 Alcohol use, unspecified, uncomplicated: Secondary | ICD-10-CM | POA: Diagnosis present

## 2019-12-08 DIAGNOSIS — R0989 Other specified symptoms and signs involving the circulatory and respiratory systems: Secondary | ICD-10-CM

## 2019-12-08 DIAGNOSIS — Z789 Other specified health status: Secondary | ICD-10-CM | POA: Diagnosis present

## 2019-12-08 DIAGNOSIS — R9431 Abnormal electrocardiogram [ECG] [EKG]: Secondary | ICD-10-CM | POA: Insufficient documentation

## 2019-12-08 DIAGNOSIS — B182 Chronic viral hepatitis C: Secondary | ICD-10-CM | POA: Insufficient documentation

## 2019-12-08 DIAGNOSIS — I4510 Unspecified right bundle-branch block: Secondary | ICD-10-CM | POA: Insufficient documentation

## 2019-12-08 DIAGNOSIS — M069 Rheumatoid arthritis, unspecified: Secondary | ICD-10-CM | POA: Insufficient documentation

## 2019-12-08 DIAGNOSIS — R0602 Shortness of breath: Secondary | ICD-10-CM | POA: Insufficient documentation

## 2019-12-08 DIAGNOSIS — F17210 Nicotine dependence, cigarettes, uncomplicated: Secondary | ICD-10-CM | POA: Insufficient documentation

## 2019-12-08 HISTORY — PX: RIGHT/LEFT HEART CATH AND CORONARY ANGIOGRAPHY: CATH118266

## 2019-12-08 LAB — POCT I-STAT EG7
Acid-base deficit: 1 mmol/L (ref 0.0–2.0)
Acid-base deficit: 1 mmol/L (ref 0.0–2.0)
Bicarbonate: 24.4 mmol/L (ref 20.0–28.0)
Bicarbonate: 25 mmol/L (ref 20.0–28.0)
Calcium, Ion: 1.29 mmol/L (ref 1.15–1.40)
Calcium, Ion: 1.3 mmol/L (ref 1.15–1.40)
HCT: 36 % — ABNORMAL LOW (ref 39.0–52.0)
HCT: 36 % — ABNORMAL LOW (ref 39.0–52.0)
Hemoglobin: 12.2 g/dL — ABNORMAL LOW (ref 13.0–17.0)
Hemoglobin: 12.2 g/dL — ABNORMAL LOW (ref 13.0–17.0)
O2 Saturation: 71 %
O2 Saturation: 72 %
Potassium: 3.9 mmol/L (ref 3.5–5.1)
Potassium: 4 mmol/L (ref 3.5–5.1)
Sodium: 138 mmol/L (ref 135–145)
Sodium: 138 mmol/L (ref 135–145)
TCO2: 26 mmol/L (ref 22–32)
TCO2: 26 mmol/L (ref 22–32)
pCO2, Ven: 44.7 mmHg (ref 44.0–60.0)
pCO2, Ven: 45.3 mmHg (ref 44.0–60.0)
pH, Ven: 7.346 (ref 7.250–7.430)
pH, Ven: 7.351 (ref 7.250–7.430)
pO2, Ven: 39 mmHg (ref 32.0–45.0)
pO2, Ven: 40 mmHg (ref 32.0–45.0)

## 2019-12-08 LAB — POCT I-STAT 7, (LYTES, BLD GAS, ICA,H+H)
Acid-base deficit: 2 mmol/L (ref 0.0–2.0)
Bicarbonate: 23.4 mmol/L (ref 20.0–28.0)
Calcium, Ion: 1.3 mmol/L (ref 1.15–1.40)
HCT: 36 % — ABNORMAL LOW (ref 39.0–52.0)
Hemoglobin: 12.2 g/dL — ABNORMAL LOW (ref 13.0–17.0)
O2 Saturation: 97 %
Potassium: 4 mmol/L (ref 3.5–5.1)
Sodium: 137 mmol/L (ref 135–145)
TCO2: 25 mmol/L (ref 22–32)
pCO2 arterial: 40.9 mmHg (ref 32.0–48.0)
pH, Arterial: 7.365 (ref 7.350–7.450)
pO2, Arterial: 91 mmHg (ref 83.0–108.0)

## 2019-12-08 LAB — CBC
HCT: 40.5 % (ref 39.0–52.0)
Hemoglobin: 13.3 g/dL (ref 13.0–17.0)
MCH: 28.2 pg (ref 26.0–34.0)
MCHC: 32.8 g/dL (ref 30.0–36.0)
MCV: 86 fL (ref 80.0–100.0)
Platelets: 234 10*3/uL (ref 150–400)
RBC: 4.71 MIL/uL (ref 4.22–5.81)
RDW: 11.8 % (ref 11.5–15.5)
WBC: 6.5 10*3/uL (ref 4.0–10.5)
nRBC: 0 % (ref 0.0–0.2)

## 2019-12-08 SURGERY — RIGHT/LEFT HEART CATH AND CORONARY ANGIOGRAPHY
Anesthesia: LOCAL

## 2019-12-08 MED ORDER — LIDOCAINE HCL (PF) 1 % IJ SOLN
INTRAMUSCULAR | Status: DC | PRN
  Administered 2019-12-08 (×2): 2 mL

## 2019-12-08 MED ORDER — LABETALOL HCL 5 MG/ML IV SOLN: 10.0000 mg | INTRAVENOUS | Status: DC | PRN

## 2019-12-08 MED ORDER — MIDAZOLAM HCL 2 MG/2ML IJ SOLN
INTRAMUSCULAR | Status: AC
  Filled 2019-12-08: qty 2

## 2019-12-08 MED ORDER — SODIUM CHLORIDE 0.9 % IV SOLN: 250.0000 mL | INTRAVENOUS | Status: DC | PRN

## 2019-12-08 MED ORDER — HEPARIN (PORCINE) IN NACL 1000-0.9 UT/500ML-% IV SOLN
INTRAVENOUS | Status: DC | PRN
  Administered 2019-12-08 (×2): 500 mL

## 2019-12-08 MED ORDER — HEPARIN SODIUM (PORCINE) 1000 UNIT/ML IJ SOLN
INTRAMUSCULAR | Status: AC
  Filled 2019-12-08: qty 1

## 2019-12-08 MED ORDER — SODIUM CHLORIDE 0.9 % WEIGHT BASED INFUSION
3.0000 mL/kg/h | INTRAVENOUS | Status: AC
  Administered 2019-12-08: 3 mL/kg/h via INTRAVENOUS

## 2019-12-08 MED ORDER — FENTANYL CITRATE (PF) 100 MCG/2ML IJ SOLN
INTRAMUSCULAR | Status: DC | PRN
  Administered 2019-12-08: 50 ug via INTRAVENOUS

## 2019-12-08 MED ORDER — ASPIRIN 81 MG PO CHEW: 81.0000 mg | CHEWABLE_TABLET | ORAL | Status: DC

## 2019-12-08 MED ORDER — SODIUM CHLORIDE 0.9 % IV SOLN: INTRAVENOUS | Status: DC

## 2019-12-08 MED ORDER — FENTANYL CITRATE (PF) 100 MCG/2ML IJ SOLN
INTRAMUSCULAR | Status: AC
  Filled 2019-12-08: qty 2

## 2019-12-08 MED ORDER — FUROSEMIDE 10 MG/ML IJ SOLN
INTRAMUSCULAR | Status: AC
  Filled 2019-12-08: qty 4

## 2019-12-08 MED ORDER — SODIUM CHLORIDE 0.9% FLUSH: 3.0000 mL | INTRAVENOUS | Status: DC | PRN

## 2019-12-08 MED ORDER — MIDAZOLAM HCL 2 MG/2ML IJ SOLN
INTRAMUSCULAR | Status: DC | PRN
  Administered 2019-12-08: 1 mg via INTRAVENOUS

## 2019-12-08 MED ORDER — LIDOCAINE HCL (PF) 1 % IJ SOLN
INTRAMUSCULAR | Status: AC
  Filled 2019-12-08: qty 30

## 2019-12-08 MED ORDER — FUROSEMIDE 20 MG PO TABS: 20.0000 mg | ORAL_TABLET | Freq: Every day | ORAL | Status: DC

## 2019-12-08 MED ORDER — SODIUM CHLORIDE 0.9% FLUSH: 3.0000 mL | Freq: Two times a day (BID) | INTRAVENOUS | Status: DC

## 2019-12-08 MED ORDER — ACETAMINOPHEN 325 MG PO TABS: 650.0000 mg | ORAL_TABLET | ORAL | Status: DC | PRN

## 2019-12-08 MED ORDER — HEPARIN (PORCINE) IN NACL 1000-0.9 UT/500ML-% IV SOLN
INTRAVENOUS | Status: AC
  Filled 2019-12-08: qty 1000

## 2019-12-08 MED ORDER — VERAPAMIL HCL 2.5 MG/ML IV SOLN
INTRAVENOUS | Status: DC | PRN
  Administered 2019-12-08: 08:00:00 10 mL via INTRA_ARTERIAL

## 2019-12-08 MED ORDER — ONDANSETRON HCL 4 MG/2ML IJ SOLN: 4.0000 mg | Freq: Four times a day (QID) | INTRAMUSCULAR | Status: DC | PRN

## 2019-12-08 MED ORDER — HYDRALAZINE HCL 20 MG/ML IJ SOLN: 10.0000 mg | INTRAMUSCULAR | Status: DC | PRN

## 2019-12-08 MED ORDER — VERAPAMIL HCL 2.5 MG/ML IV SOLN
INTRAVENOUS | Status: AC
  Filled 2019-12-08: qty 2

## 2019-12-08 MED ORDER — IOHEXOL 350 MG/ML SOLN
INTRAVENOUS | Status: DC | PRN
  Administered 2019-12-08: 40 mL

## 2019-12-08 MED ORDER — SODIUM CHLORIDE 0.9 % WEIGHT BASED INFUSION: 1.0000 mL/kg/h | INTRAVENOUS | Status: DC

## 2019-12-08 MED ORDER — HEPARIN SODIUM (PORCINE) 1000 UNIT/ML IJ SOLN
INTRAMUSCULAR | Status: DC | PRN
  Administered 2019-12-08: 4000 [IU] via INTRAVENOUS

## 2019-12-08 SURGICAL SUPPLY — 12 items
CATH BALLN WEDGE 5F 110CM (CATHETERS) ×2 IMPLANT
CATH OPTITORQUE TIG 4.0 5F (CATHETERS) ×2 IMPLANT
DEVICE RAD COMP TR BAND LRG (VASCULAR PRODUCTS) ×2 IMPLANT
GLIDESHEATH SLEND SS 6F .021 (SHEATH) ×2 IMPLANT
GUIDEWIRE INQWIRE 1.5J.035X260 (WIRE) ×1 IMPLANT
INQWIRE 1.5J .035X260CM (WIRE) ×2
KIT HEART LEFT (KITS) ×2 IMPLANT
PACK CARDIAC CATHETERIZATION (CUSTOM PROCEDURE TRAY) ×2 IMPLANT
SHEATH GLIDE SLENDER 4/5FR (SHEATH) ×2 IMPLANT
SHEATH PROBE COVER 6X72 (BAG) ×2 IMPLANT
TRANSDUCER W/STOPCOCK (MISCELLANEOUS) ×2 IMPLANT
TUBING CIL FLEX 10 FLL-RA (TUBING) ×2 IMPLANT

## 2019-12-08 NOTE — Progress Notes (Signed)
Patient and wife given discharge instructions. Both verbalized understanding.  

## 2019-12-08 NOTE — Discharge Instructions (Signed)
Drink plenty of fluid for 48 hours and keep wrist elevated at heart level for 24 hours  Radial Site Care   This sheet gives you information about how to care for yourself after your procedure. Your health care provider may also give you more specific instructions. If you have problems or questions, contact your health care provider. What can I expect after the procedure? After the procedure, it is common to have:  Bruising and tenderness at the catheter insertion area. Follow these instructions at home: Medicines  Take over-the-counter and prescription medicines only as told by your health care provider. Insertion site care 1. Follow instructions from your health care provider about how to take care of your insertion site. Make sure you: ? Wash your hands with soap and water before you change your bandage (dressing). If soap and water are not available, use hand sanitizer. ? remove your dressing as told by your health care provider. In 24 hours 2. Check your insertion site every day for signs of infection. Check for: ? Redness, swelling, or pain. ? Fluid or blood. ? Pus or a bad smell. ? Warmth. 3. Do not take baths, swim, or use a hot tub until your health care provider approves. 4. You may shower 24-48 hours after the procedure, or as directed by your health care provider. ? Remove the dressing and gently wash the site with plain soap and water. ? Pat the area dry with a clean towel. ? Do not rub the site. That could cause bleeding. 5. Do not apply powder or lotion to the site. Activity   1. For 24 hours after the procedure, or as directed by your health care provider: ? Do not flex or bend the affected arm. ? Do not push or pull heavy objects with the affected arm. ? Do not drive yourself home from the hospital or clinic. You may drive 24 hours after the procedure unless your health care provider tells you not to. ? Do not operate machinery or power tools. 2. Do not lift  anything that is heavier than 10 lb (4.5 kg), or the limit that you are told, until your health care provider says that it is safe. For 4 days 3. Ask your health care provider when it is okay to: ? Return to work or school. ? Resume usual physical activities or sports. ? Resume sexual activity. General instructions  If the catheter site starts to bleed, raise your arm and put firm pressure on the site. If the bleeding does not stop, get help right away. This is a medical emergency.  If you went home on the same day as your procedure, a responsible adult should be with you for the first 24 hours after you arrive home.  Keep all follow-up visits as told by your health care provider. This is important. Contact a health care provider if:  You have a fever.  You have redness, swelling, or yellow drainage around your insertion site. Get help right away if:  You have unusual pain at the radial site.  The catheter insertion area swells very fast.  The insertion area is bleeding, and the bleeding does not stop when you hold steady pressure on the area.  Your arm or hand becomes pale, cool, tingly, or numb. These symptoms may represent a serious problem that is an emergency. Do not wait to see if the symptoms will go away. Get medical help right away. Call your local emergency services (911 in the [place] Do not   drive yourself to the hospital. Summary  After the procedure, it is common to have bruising and tenderness at the site.  Follow instructions from your health care provider about how to take care of your radial site wound. Check the wound every day for signs of infection.  Do not lift anything that is heavier than 10 lb (4.5 kg), or the limit that you are told, until your health care provider says that it is safe. This information is not intended to replace advice given to you by your health care provider. Make sure you discuss any questions you have with your health care  provider. Document Revised: 09/23/2017 Document Reviewed: 09/23/2017 Elsevier Patient Education  2020 [employer]  

## 2019-12-08 NOTE — Interval H&P Note (Signed)
History and Physical Interval Note:  12/08/2019 7:39 AM  Melvin Rich  has presented today for surgery, with the diagnosis of low ef  sob.  The various methods of treatment have been discussed with the patient and family. After consideration of risks, benefits and other options for treatment, the patient has consented to  Procedure(s): RIGHT/LEFT HEART CATH AND CORONARY ANGIOGRAPHY (N/A)  PERCUTANEOUS CORONARY INTERVENTION  as a surgical intervention.  The patient's history has been reviewed, patient examined, no change in status, stable for surgery.  I have reviewed the patient's chart and labs.  Questions were answered to the patient's satisfaction.    Cath Lab Visit (complete for each Cath Lab visit)  Clinical Evaluation Leading to the Procedure:   ACS: No.  Non-ACS:    Anginal Classification: No Symptoms  Anti-ischemic medical therapy: Minimal Therapy (1 class of medications)  Non-Invasive Test Results: Equivocal test results - low EF - unknown cause  Prior CABG: No previous CABG  Bryan Lemma

## 2019-12-08 NOTE — Research (Signed)
PHDE Informed Consent   Subject Name: Melvin Rich  Subject met inclusion and exclusion criteria.  The informed consent form, study requirements and expectations were reviewed with the subject and questions and concerns were addressed prior to the signing of the consent form.  The subject verbalized understanding of the trail requirements.  The subject agreed to participate in the PHDE trial and signed the informed consent.  The informed consent was obtained prior to performance of any protocol-specific procedures for the subject.  A copy of the signed informed consent was given to the subject and a copy was placed in the subject's medical record.  [name]. 12/08/2019, 0700 am

## 2019-12-09 MED FILL — Furosemide Inj 10 MG/ML: INTRAMUSCULAR | Qty: 4 | Status: AC

## 2019-12-16 ENCOUNTER — Ambulatory Visit: Payer: Self-pay | Admitting: Family Medicine

## 2019-12-30 ENCOUNTER — Other Ambulatory Visit: Payer: Self-pay

## 2019-12-30 ENCOUNTER — Ambulatory Visit (INDEPENDENT_AMBULATORY_CARE_PROVIDER_SITE_OTHER): Payer: Self-pay | Admitting: Cardiology

## 2019-12-30 ENCOUNTER — Telehealth: Payer: Self-pay | Admitting: Cardiology

## 2019-12-30 ENCOUNTER — Encounter: Payer: Self-pay | Admitting: Cardiology

## 2019-12-30 ENCOUNTER — Encounter: Payer: Self-pay | Admitting: Emergency Medicine

## 2019-12-30 VITALS — BP 132/94 | HR 90 | Ht 69.0 in | Wt 145.0 lb

## 2019-12-30 DIAGNOSIS — R0989 Other specified symptoms and signs involving the circulatory and respiratory systems: Secondary | ICD-10-CM | POA: Insufficient documentation

## 2019-12-30 DIAGNOSIS — I428 Other cardiomyopathies: Secondary | ICD-10-CM

## 2019-12-30 DIAGNOSIS — F1090 Alcohol use, unspecified, uncomplicated: Secondary | ICD-10-CM

## 2019-12-30 DIAGNOSIS — I4510 Unspecified right bundle-branch block: Secondary | ICD-10-CM

## 2019-12-30 DIAGNOSIS — Z789 Other specified health status: Secondary | ICD-10-CM

## 2019-12-30 DIAGNOSIS — I420 Dilated cardiomyopathy: Secondary | ICD-10-CM

## 2019-12-30 DIAGNOSIS — I10 Essential (primary) hypertension: Secondary | ICD-10-CM

## 2019-12-30 MED ORDER — METOPROLOL SUCCINATE ER 25 MG PO TB24: 12.5000 mg | ORAL_TABLET | Freq: Every day | ORAL | 1 refills | Status: DC

## 2019-12-30 MED ORDER — FUROSEMIDE 20 MG PO TABS: 20.0000 mg | ORAL_TABLET | Freq: Every day | ORAL | 1 refills | Status: DC

## 2019-12-30 MED ORDER — SPIRONOLACTONE 25 MG PO TABS: 12.5000 mg | ORAL_TABLET | Freq: Every day | ORAL | 1 refills | Status: DC

## 2019-12-30 MED ORDER — VALSARTAN 320 MG PO TABS: 320.0000 mg | ORAL_TABLET | Freq: Every day | ORAL | 1 refills | Status: DC

## 2019-12-30 NOTE — Patient Instructions (Signed)
Medication Instructions:  Your physician has recommended you make the following change in your medication:    STOP: Potassium   CONTINUE: Lasix 20 mg daily   *If you need a refill on your cardiac medications before your next appointment, please call your pharmacy*   Lab Work: Your physician recommends that you return for lab work today: bmp, mg   If you have labs (blood work) drawn today and your tests are completely normal, you will receive your results only by: Marland Kitchen MyChart Message (if you have MyChart) OR . A paper copy in the mail If you have any lab test that is abnormal or we need to change your treatment, we will call you to review the results.   Testing/Procedures: None.    Follow-Up: At [facility], you and your health needs are our priority.  As part of our continuing mission to provide you with exceptional heart care, we have created designated Provider Care Teams.  These Care Teams include your primary Cardiologist (physician) and Advanced Practice Providers (APPs -  Physician Assistants and Nurse Practitioners) who all work together to provide you with the care you need, when you need it.  We recommend signing up for the patient portal called "MyChart".  Sign up information is provided on this After Visit Summary.  MyChart is used to connect with patients for Virtual Visits (Telemedicine).  Patients are able to view lab/test results, encounter notes, upcoming appointments, etc.  Non-urgent messages can be sent to your provider as well.   To learn more about what you can do with MyChart, go to [email or web address].    Your next appointment:   3 month(s)  The format for your next appointment:   In Person  Provider:   Thomasene Ripple, DO   Other Instructions

## 2019-12-30 NOTE — Telephone Encounter (Signed)
New Message   Patient is requesting a return to work letter but he needs it to say that "he is able to work on a roof". He would like for it to be emailed to him at [email or web address] because he needs it urgently.

## 2019-12-30 NOTE — Telephone Encounter (Signed)
Scanned new letter to patients email as requested.

## 2019-12-30 NOTE — Telephone Encounter (Signed)
Called patient informed him of Dr. Mallory Shirk note below. He would still like me to send a new letter with the elaboration that she provided. Will work on this.

## 2019-12-30 NOTE — Progress Notes (Signed)
Cardiology Office Note:    Date:  12/30/2019   ID:  Melvin Rich, DOB May 23, 1961, MRN [id number]  PCP:  Patient, No Pcp Per  Cardiologist:  No primary care provider on file.  Electrophysiologist:  None   Referring MD: Luetta Nutting, DO   Chief Complaint  Patient presents with  . Follow-up    1 Month    History of Present Illness:    Melvin Rich is a 59 y.o. male with a hx of hypertension, depressed ejection fraction EF 25 to 30% on echocardiogram done on November 25, 2019, tobacco use, and heavy alcohol use.  I did see the patient for the 1st time on November 20, 2019 at that time he presented due to echocardiogram showing ejection fraction of 25 to 30%. Given his significant depressed ejection fraction I recommended patient undergo right and left heart catheterization. In addition since he was already on valsartan 320 mg daily I added Toprol-XL 12.5 mg daily as well as Aldactone 12.5 mg daily.  On December 08, 2019 he was able to get his left heart catheterization which showed no evidence of coronary artery disease however his right heart pressures were elevated. Therefore he was asked to take his Lasix daily.  Tells me he has been taking his medication as prescribed. However yesterday he lost his medication helping a friend move. He feels great he says shortness of breath have improved significantly   Past Medical History:  Diagnosis Date  . Alcohol use disorder   . BPPV (benign paroxysmal positional vertigo), right 07/27/2018  . Cardiomegaly 11/16/2019  . Chronic active hepatitis ([place]) 04/23/2017   Pt stated that he had the treatment  . Chronic hepatitis C virus infection ([place]) 04/23/2017  . DDD (degenerative disc disease), cervical 04/18/2017   Post C5-C7 ACDF with Dr. Lynann Bologna June 2020  . Degenerative disc disease, cervical   . Degenerative disc disease, cervical   . Eustachian tube dysfunction, bilateral 02/23/2018  . Hearing loss of left ear 07/20/2018  . History  of drug use disorder    Heroin, IVDA  . 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 Hypertension   . Hypertension goal BP (blood pressure) < 130/80 04/18/2017  . Left arm pain   . 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 Poor dentition 05/05/2017  . Radiculopathy 02/17/2019  . Rheumatoid arthritis ([place])   . Rheumatoid arthritis involving both elbows ([place]) 04/18/2017  . Seasonal allergies   . 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 Substance abuse ([place])   . Transaminitis 04/23/2017  . Trigger thumb, left thumb 03/22/2019    Past Surgical History:  Procedure Laterality Date  . ANTERIOR CERVICAL DECOMP/DISCECTOMY FUSION N/A 02/17/2019   Procedure: ANTERIOR CERVICAL DECOMPRESSION FUSION, CERVICAL 5-6, CERVICAL 6-7 WITH INSTRUMENTATION AND ALLOGRAFT;  Surgeon: Phylliss Bob, MD;  Location: [facility];  Service: Orthopedics;  Laterality: N/A;  . HIP SURGERY  2005  . RIGHT/LEFT HEART CATH AND CORONARY ANGIOGRAPHY N/A 12/08/2019   Procedure: RIGHT/LEFT HEART CATH AND CORONARY ANGIOGRAPHY;  Surgeon: Leonie Man, MD;  Location: [facility];  Service: Cardiovascular;  Laterality: N/A;  . SHOULDER SURGERY  1985    Current Medications: Current Meds  Medication Sig  . furosemide (LASIX) 20 MG tablet Take 1 tablet (20 mg total) by mouth daily. Go back to taking daily  . metoprolol succinate (TOPROL XL) 25 MG 24 hr tablet Take 0.5 tablets (12.5 mg total) by mouth daily.  . Multiple Vitamins-Minerals (MULTIVITAMIN WITH MINERALS)  tablet Take 1 tablet by mouth daily.  Marland Kitchen NASAL SALINE NA Place 1 spray into the nose as needed (Congestion).  . Omega-3 Fatty Acids (FISH OIL PO) Take 6,000 mg by mouth daily.   Marland Kitchen spironolactone (ALDACTONE) 25 MG tablet Take 0.5 tablets (12.5 mg total) by mouth daily.  . valsartan (DIOVAN) 320 MG tablet Take 1 tablet (320 mg total) by mouth daily.  . [DISCONTINUED] furosemide (LASIX) 20 MG tablet Take 1 tablet (20 mg total) by mouth daily. Go back to taking daily  . [DISCONTINUED]  metoprolol succinate (TOPROL XL) 25 MG 24 hr tablet Take 0.5 tablets (12.5 mg total) by mouth daily.  . [DISCONTINUED] potassium chloride 20 MEQ TBCR Take on Tuesday, Thursday and Saturday. (Patient taking differently: Take 20 mEq by mouth See admin instructions. Take on Tuesday, Thursday and Saturday.)  . [DISCONTINUED] spironolactone (ALDACTONE) 25 MG tablet Take 0.5 tablets (12.5 mg total) by mouth daily.  . [DISCONTINUED] valsartan (DIOVAN) 320 MG tablet Take 1 tablet (320 mg total) by mouth daily.     Allergies:   Patient has no known allergies.   Social History   Socioeconomic History  . Marital status: Married    Spouse name: Not on file  . Number of children: Not on file  . Years of education: Not on file  . Highest education level: Not on file  Occupational History  . Not on file  Tobacco Use  . Smoking status: Current Every Day Smoker    Packs/day: 0.50    Years: 47.00    Pack years: 23.50    Types: Cigarettes  . Smokeless tobacco: Never Used  Substance and Sexual Activity  . Alcohol use: Yes    Alcohol/week: 14.0 standard drinks    Types: 14 Cans of beer per week    Comment: "drinks alot"  . Drug use: Not Currently    Types: Heroin    Comment: sober since 2004  . Sexual activity: Yes    Birth control/protection: None  Other Topics Concern  . Not on file  Social History Narrative  . Not on file   Social Determinants of Health   Financial Resource Strain:   . Difficulty of Paying Living Expenses:   Food Insecurity:   . Worried About Programme researcher, broadcasting/film/video in the Last Year:   . Barista in the Last Year:   Transportation Needs:   . Freight forwarder (Medical):   Marland Kitchen Lack of Transportation (Non-Medical):   Physical Activity:   . Days of Exercise per Week:   . Minutes of Exercise per Session:   Stress:   . Feeling of Stress :   Social Connections:   . Frequency of Communication with Friends and Family:   . Frequency of Social Gatherings with  Friends and Family:   . Attends Religious Services:   . Active Member of Clubs or Organizations:   . Attends Banker Meetings:   Marland Kitchen Marital Status:      Family History: The patient's He was adopted. Family history is unknown by patient.  ROS:   Review of Systems  Constitution: Negative for decreased appetite, fever and weight gain.  HENT: Negative for congestion, ear discharge, hoarse voice and sore throat.   Eyes: Negative for discharge, redness, vision loss in right eye and visual halos.  Cardiovascular: Negative for chest pain, dyspnea on exertion, leg swelling, orthopnea and palpitations.  Respiratory: Negative for cough, hemoptysis, shortness of breath and snoring.   Endocrine: Negative  for heat intolerance and polyphagia.  Hematologic/Lymphatic: Negative for bleeding problem. Does not bruise/bleed easily.  Skin: Negative for flushing, nail changes, rash and suspicious lesions.  Musculoskeletal: Negative for arthritis, joint pain, muscle cramps, myalgias, neck pain and stiffness.  Gastrointestinal: Negative for abdominal pain, bowel incontinence, diarrhea and excessive appetite.  Genitourinary: Negative for decreased libido, genital sores and incomplete emptying.  Neurological: Negative for brief paralysis, focal weakness, headaches and loss of balance.  Psychiatric/Behavioral: Negative for altered mental status, depression and suicidal ideas.  Allergic/Immunologic: Negative for HIV exposure and persistent infections.    EKGs/Labs/Other Studies Reviewed:    The following studies were reviewed today:   EKG:  The ekg ordered today demonstrates   R/L heart cath  LV end diastolic pressure is severely elevated, with moderate to severely elevated PCWP.Marland Kitchen  Otherwise normal Right Heart Cath Pressures  Angiographically normal coronary arteries.   SUMMARY  Angiographically Normal Coronary Arteries --> confirms NONISCHEMIC CARDIOMYOPATHY  Known reduced ejection  fraction-SYSTOLIC HEART FAILURE, with moderate to severely elevated LVEDP/PCWP-DIASTOLIC HEART FAILURE => CHRONIC COMBINED SYSTOLIC AND DIASTOLIC HEART FAILURE ? IV Lasix given in Cath Lab, but patient was able to lie supine without dyspnea  Otherwise normal PA, RV and RA pressures.  TTE IMPRESSIONS 11/25/2019 1. Left ventricular ejection fraction, by estimation, is 25 to 30%. The  left ventricle has severely decreased function. The left ventricle demonstrates global hypokinesis. The left ventricular internal cavity size  was mildly dilated. Left ventricular diastolic function could not be evaluated. The average left ventricular global longitudinal strain is -9.9 %.  2. Right ventricular systolic function is normal. The right ventricular size is normal.  3. The mitral valve is normal in structure. Mild mitral valve regurgitation. No evidence of mitral stenosis. 4. The aortic valve is tricuspid. Aortic valve regurgitation is not  visualized. Mild to moderate aortic valve sclerosis/calcification is present, without any evidence of aortic stenosis.  5. The inferior vena cava is normal in size with greater than 50% respiratory variability, suggesting right atrial pressure of 3 mmHg  Recent Labs: 11/30/2019: ALT 17; BUN 17; Creatinine, Ser 0.73; Magnesium 1.8 12/08/2019: Hemoglobin 12.2; Platelets 234; Potassium 3.9; Sodium 138  Recent Lipid Panel    Component Value Date/Time   CHOL 153 07/23/2018 0802   TRIG 68 07/23/2018 0802   HDL 62 07/23/2018 0802   CHOLHDL 2.5 07/23/2018 0802   LDLCALC 77 07/23/2018 0802    Physical Exam:    VS:  BP (!) 132/94   Pulse 90   Ht 5\' 9"  (1.753 m)   Wt 145 lb (65.8 kg)   SpO2 100%   BMI 21.41 kg/m     Wt Readings from Last 3 Encounters:  12/30/19 145 lb (65.8 kg)  12/08/19 140 lb (63.5 kg)  11/30/19 149 lb (67.6 kg)     GEN: Well nourished, well developed in no acute distress HEENT: Normal NECK: No JVD; No carotid bruits LYMPHATICS: No  lymphadenopathy CARDIAC: S1S2 noted,RRR, no murmurs, rubs, gallops RESPIRATORY:  Clear to auscultation without rales, wheezing or rhonchi  ABDOMEN: Soft, non-tender, non-distended, +bowel sounds, no guarding. EXTREMITIES: No edema, No cyanosis, no clubbing MUSCULOSKELETAL:  No deformity  SKIN: Warm and dry NEUROLOGIC:  Alert and oriented x 3, non-focal PSYCHIATRIC:  Normal affect, good insight  ASSESSMENT:    1. Depressed left ventricular ejection fraction   2. Hypertension goal BP (blood pressure) < 130/80   3. Nonischemic cardiomyopathy (HCC)   4. Dilated cardiomyopathy (HCC)   5. Right bundle branch  block   6. Heavy alcohol consumption    PLAN:     1.  Clinically his shortness of breath has improved since he has been on his diuretics.  At this time I will clear the patient go back to work.  He has been able to tolerate all of his daily activities.  2.  For his depressed ejection fraction we will keep him on valsartan to 20 mg daily, Aldactone 12.5 mg daily, Toprol-XL 12.5 mg daily.  I still do plan to add Marcelline Deist if there is no clinical improvement.  Discussed with the patient about a repeat echocardiogram in 90 days but I did ask him to continue take his medicine as prescribed and he also has been cut down on alcohol use.  Continue patient his Lasix 20 mg daily.  We will stop the potassium due to his use of Aldactone.  3.  I encourage patient to continue to decrease his use of alcohol.  4.  His blood pressure is slightly elevated in the office today I talked to the patient about his blood pressure goal that will need to be less than 130/80.  He did not take his medication this morning as he lost that a day ago while helping his friend.  We discussed the importance of medication use and how this can play a significant role in his recovery.  Blood work will be done today to assess his kidney function.  The patient is in agreement with the above plan. The patient left the office in  stable condition.  The patient will follow up in 3 months or sooner if needed.   Medication Adjustments/Labs and Tests Ordered: Current medicines are reviewed at length with the patient today.  Concerns regarding medicines are outlined above.  Orders Placed This Encounter  Procedures  . Basic metabolic panel  . Magnesium   Meds ordered this encounter  Medications  . furosemide (LASIX) 20 MG tablet    Sig: Take 1 tablet (20 mg total) by mouth daily. Go back to taking daily    Dispense:  90 tablet    Refill:  1  . valsartan (DIOVAN) 320 MG tablet    Sig: Take 1 tablet (320 mg total) by mouth daily.    Dispense:  90 tablet    Refill:  1  . spironolactone (ALDACTONE) 25 MG tablet    Sig: Take 0.5 tablets (12.5 mg total) by mouth daily.    Dispense:  45 tablet    Refill:  1  . metoprolol succinate (TOPROL XL) 25 MG 24 hr tablet    Sig: Take 0.5 tablets (12.5 mg total) by mouth daily.    Dispense:  45 tablet    Refill:  1    Patient Instructions  Medication Instructions:  Your physician has recommended you make the following change in your medication:    STOP: Potassium   CONTINUE: Lasix 20 mg daily   *If you need a refill on your cardiac medications before your next appointment, please call your pharmacy*   Lab Work: Your physician recommends that you return for lab work today: bmp, mg   If you have labs (blood work) drawn today and your tests are completely normal, you will receive your results only by: Marland Kitchen MyChart Message (if you have MyChart) OR . A paper copy in the mail If you have any lab test that is abnormal or we need to change your treatment, we will call you to review the results.   Testing/Procedures: None.  Follow-Up: At [facility], you and your health needs are our priority.  As part of our continuing mission to provide you with exceptional heart care, we have created designated Provider Care Teams.  These Care Teams include your primary  Cardiologist (physician) and Advanced Practice Providers (APPs -  Physician Assistants and Nurse Practitioners) who all work together to provide you with the care you need, when you need it.  We recommend signing up for the patient portal called "MyChart".  Sign up information is provided on this After Visit Summary.  MyChart is used to connect with patients for Virtual Visits (Telemedicine).  Patients are able to view lab/test results, encounter notes, upcoming appointments, etc.  Non-urgent messages can be sent to your provider as well.   To learn more about what you can do with MyChart, go to [email or web address].    Your next appointment:   3 month(s)  The format for your next appointment:   In Person  Provider:   Thomasene Ripple, DO   Other Instructions       Adopting a Healthy Lifestyle.  Know what a healthy weight is for you (roughly BMI <25) and aim to maintain this   Aim for 7+ servings of fruits and vegetables daily   65-80+ fluid ounces of water or unsweet tea for healthy kidneys   Limit to max 1 drink of alcohol per day; avoid smoking/tobacco   Limit animal fats in diet for cholesterol and heart health - choose grass fed whenever available   Avoid highly processed foods, and foods high in saturated/trans fats   Aim for low stress - take time to unwind and care for your mental health   Aim for 150 min of moderate intensity exercise weekly for heart health, and weights twice weekly for bone health   Aim for 7-9 hours of sleep daily   When it comes to diets, agreement about the perfect plan isnt easy to find, even among the experts. Experts at the [facility] [employer] developed an idea known as the Healthy Eating Plate. Just imagine a plate divided into logical, healthy portions.   The emphasis is on diet quality:   Load up on vegetables and fruits - one-half of your plate: Aim for color and variety, and remember that potatoes dont count.   Go for  whole grains - one-quarter of your plate: Whole wheat, barley, wheat berries, quinoa, oats, brown rice, and foods made with them. If you want pasta, go with whole wheat pasta.   Protein power - one-quarter of your plate: Fish, chicken, beans, and nuts are all healthy, versatile protein sources. Limit red meat.   The diet, however, does go beyond the plate, offering a few other suggestions.   Use healthy plant oils, such as olive, canola, soy, corn, sunflower and peanut. Check the labels, and avoid partially hydrogenated oil, which have unhealthy trans fats.   If youre thirsty, drink water. Coffee and tea are good in moderation, but skip sugary drinks and limit milk and dairy products to one or two daily servings.   The type of carbohydrate in the diet is more important than the amount. Some sources of carbohydrates, such as vegetables, fruits, whole grains, and beans-are healthier than others.   Finally, stay active  Signed, Thomasene Ripple, DO  12/30/2019 10:23 AM    [facility]

## 2019-12-30 NOTE — Telephone Encounter (Signed)
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 For my opinion from a cardiovascular standpoint, based on patient clinical exam today and his history reporting that he has improved clinically from a shortness of breath standpoint I can clear him to go to work-from a cardiovascular standpoint.  If there are other offices involved he will also need to get a clearance from his primary care doctor.

## 2019-12-31 LAB — BASIC METABOLIC PANEL
BUN/Creatinine Ratio: 29 — ABNORMAL HIGH (ref 9–20)
BUN: 20 mg/dL (ref 6–24)
CO2: 21 mmol/L (ref 20–29)
Calcium: 9.3 mg/dL (ref 8.7–10.2)
Chloride: 104 mmol/L (ref 96–106)
Creatinine, Ser: 0.7 mg/dL — ABNORMAL LOW (ref 0.76–1.27)
GFR calc Af Amer: 120 mL/min/{1.73_m2} (ref >59)
GFR calc non Af Amer: 104 mL/min/{1.73_m2} (ref >59)
Glucose: 92 mg/dL (ref 65–99)
Potassium: 4.1 mmol/L (ref 3.5–5.2)
Sodium: 138 mmol/L (ref 134–144)

## 2019-12-31 LAB — MAGNESIUM: Magnesium: 2 mg/dL (ref 1.6–2.3)

## 2020-01-04 ENCOUNTER — Encounter: Payer: Self-pay | Admitting: Neurology

## 2020-01-04 ENCOUNTER — Other Ambulatory Visit: Payer: Self-pay

## 2020-01-04 ENCOUNTER — Ambulatory Visit (INDEPENDENT_AMBULATORY_CARE_PROVIDER_SITE_OTHER): Payer: Self-pay | Admitting: Neurology

## 2020-01-04 DIAGNOSIS — Z0289 Encounter for other administrative examinations: Secondary | ICD-10-CM

## 2020-01-04 DIAGNOSIS — G5603 Carpal tunnel syndrome, bilateral upper limbs: Secondary | ICD-10-CM | POA: Insufficient documentation

## 2020-01-04 NOTE — Procedures (Signed)
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  Summary of the tests: Conduction study: Median sensory responses were absent.  Right median motor responses showed severely prolonged distal latency, with normal CMAP amplitude, conduction velocity.  Bilateral ulnar sensory and motor responses were normal.  Bilateral radial sensory responses were normal  Electromyography: Selected needle examination of right upper extremity only showed mild decreased recruitment at right abductor pollicis brevis, there was no evidence of axonal loss.  Conclusion: This is an abnormal study.  There is electrodiagnostic evidence of bilateral median neuropathy across the wrist, consistent with moderate bilateral carpal tunnel syndromes, demyelinating in nature.  There is no evidence of axonal loss.    ------------------------------- Levert Feinstein, M.D. PhD  [facility] [address] [place], [place] [address] Tel: [phone] Fax: [phone]  Verbal informed consent was obtained from the patient, patient was informed of potential risk of procedure, including bruising, bleeding, hematoma formation, infection, muscle weakness, muscle pain, numbness, among others.         MNC    Nerve / Sites Muscle Latency Ref. Amplitude Ref. Rel Amp Segments Distance Velocity Ref. Area    ms ms mV mV %  cm m/s m/s mVms  L Median - APB     Wrist APB 7.0 ?4.4 4.4 ?4.0 100 Wrist - APB 7   19.1     Upper arm APB 12.0  4.3  96.6 Upper arm - Wrist 24 49 ?49 18.4  R Median - APB     Wrist APB 7.0 ?4.4 5.3 ?4.0  100 Wrist - APB 7   18.9     Upper arm APB 11.9  4.5  84.7 Upper arm - Wrist 24 49 ?49 16.6  L Ulnar - ADM     Wrist ADM 3.3 ?3.3 10.9 ?6.0 100 Wrist - ADM 7   36.0     B.Elbow ADM 7.4  10.1  92.3 B.Elbow - Wrist 22 53 ?49 34.4     A.Elbow ADM 9.3  9.4  93.1 A.Elbow - B.Elbow 10 53 ?49 31.9         A.Elbow - Wrist      R Ulnar - ADM     Wrist ADM 2.9 ?3.3 10.3 ?6.0 100 Wrist - ADM 7   31.4     B.Elbow ADM 6.9  9.7  94 B.Elbow - Wrist 22 56 ?49 29.6     A.Elbow ADM 8.6  9.8  101 A.Elbow - B.Elbow 10 56 ?49 30.2         A.Elbow - Wrist                 SNC    Nerve / Sites Rec. Site Peak Lat Ref.  Amp Ref. Segments Distance    ms ms V V  cm  L Radial - Anatomical snuff box (Forearm)     Forearm Wrist 2.9 ?2.9 15 ?15 Forearm - Wrist 10  R Radial - Anatomical snuff box (Forearm)     Forearm Wrist 2.5 ?2.9 16 ?15 Forearm - Wrist 10  L Median - Orthodromic (Dig II, Mid palm)     Dig  II Wrist NR ?3.4 NR ?10 Dig II - Wrist 13  R Median - Orthodromic (Dig II, Mid palm)     Dig II Wrist NR ?3.4 NR ?10 Dig II - Wrist 13  L Ulnar - Orthodromic, (Dig V, Mid palm)     Dig V Wrist 2.9 ?3.1 6 ?5 Dig V - Wrist 13  R Ulnar - Orthodromic, (Dig V, Mid palm)     Dig V Wrist 3.1 ?3.1 5 ?5 Dig V - Wrist 38                 F  Wave    Nerve F Lat Ref.   ms ms  L Ulnar - ADM 32.2 ?32.0  R Ulnar - ADM 31.3 ?32.0         EMG Summary Table    Spontaneous MUAP Recruitment  Muscle IA Fib PSW Fasc Other Amp Dur. Poly Pattern  R. First dorsal interosseous Normal None None None _______ Normal Normal Normal Normal  R. Abductor pollicis brevis Normal None None None _______ Normal Normal Normal Reduced  R. Pronator teres Normal None None None _______ Normal Normal Normal Normal  R. Brachioradialis Normal None None None _______ Normal Normal Normal Normal  R. Triceps brachii Normal None None None _______ Normal Normal Normal Normal  R. Deltoid Normal None None None _______ Normal Normal Normal Normal  R.  Biceps brachii Normal None None None _______ Normal Normal Normal Normal  R. Flexor digitorum profundus (Ulnar) Normal None None None _______ Normal Normal Normal Normal

## 2020-01-10 ENCOUNTER — Ambulatory Visit (INDEPENDENT_AMBULATORY_CARE_PROVIDER_SITE_OTHER): Payer: Self-pay | Admitting: Sports Medicine

## 2020-01-10 ENCOUNTER — Other Ambulatory Visit: Payer: Self-pay

## 2020-01-10 DIAGNOSIS — M65312 Trigger thumb, left thumb: Secondary | ICD-10-CM

## 2020-01-10 DIAGNOSIS — G5603 Carpal tunnel syndrome, bilateral upper limbs: Secondary | ICD-10-CM

## 2020-01-10 NOTE — Progress Notes (Signed)
Procedures performed today:    Procedure: Real-time Ultrasound Guided hydrodissection of the left median nerve at the carpal tunnel Device: Samsung HS60 Verbal informed consent obtained.  Time-out conducted.  Noted no overlying erythema, induration, or other signs of local infection.  Skin prepped in a sterile fashion.  Local anesthesia: Topical Ethyl chloride.  With sterile technique and under real time ultrasound guidance: Using a 25-gauge needle advanced into the carpal tunnel, taking care to avoid intraneural injection I injected medication both superficial to and deep to the median nerve freeing it from surrounding structures, I then redirected the needle deep and injected further medication around the flexor tendons deep within the carpal tunnel for a total of 1 cc kenalog 40, 5 cc 1% lidocaine without epinephrine. Completed without difficulty  Advised to call if fevers/chills, erythema, induration, drainage, or persistent bleeding.  Images permanently stored and available for review in the ultrasound unit.  Impression: Technically successful ultrasound guided median nerve hydrodissection.  Procedure: Real-time Ultrasound Guided hydrodissection of the right median nerve at the carpal tunnel Device: Samsung HS60 Verbal informed consent obtained.  Time-out conducted.  Noted no overlying erythema, induration, or other signs of local infection.  Skin prepped in a sterile fashion.  Local anesthesia: Topical Ethyl chloride.  With sterile technique and under real time ultrasound guidance: Using a 25-gauge needle advanced into the carpal tunnel, taking care to avoid intraneural injection I injected medication both superficial to and deep to the median nerve freeing it from surrounding structures, I then redirected the needle deep and injected further medication around the flexor tendons deep within the carpal tunnel for a total of 1 cc kenalog 40, 5 cc 1% lidocaine without  epinephrine. Completed without difficulty  Advised to call if fevers/chills, erythema, induration, drainage, or persistent bleeding.  Images permanently stored and available for review in the ultrasound unit.  Impression: Technically successful ultrasound guided median nerve hydrodissection.  Procedure: Real-time Ultrasound Guided injection of the left flexor pollicis longus tendon sheath Device: Samsung HS60  Verbal informed consent obtained.  Time-out conducted.  Noted no overlying erythema, induration, or other signs of local infection.  Skin prepped in a sterile fashion.  Local anesthesia: Topical Ethyl chloride.  With sterile technique and under real time ultrasound guidance:  1/2 cc lidocaine, 1/2 cc Kenalog 40 injected easily, there was a flexor pollicis longus tendon sheath effusion.   Completed without difficulty  Pain immediately resolved suggesting accurate placement of the medication.  Advised to call if fevers/chills, erythema, induration, drainage, or persistent bleeding.  Images permanently stored and available for review in the ultrasound unit.  Impression: Technically successful ultrasound guided injection.  Independent interpretation of notes and tests performed by another provider:   None.  Brief History, Exam, Impression, and Recommendations:    Bilateral carpal tunnel syndrome Melvin Rich returns, he is a pleasant 59 year old male, he has bilateral hand numbness and tingling, we obtained a nerve conduction and EMG that showed bilateral demyelinating median neuropathy at the wrists consistent with carpal tunnel syndrome. Today I performed bilateral median nerve hydrodissection procedures, return to see me in 1 month. He understands that if this does not work we will be proceeding with referral for carpal tunnel release surgery.  Trigger thumb, left thumb Melvin Rich is also having a recurrence of left thumb pain, he has a history of trigger thumb, we did a flexor pollicis  longus tendon sheath injection back in July 2020, he has done well since, today I performed a repeat left  flexor pollicis longus tendon sheath injection, return in 1 month for this.    ___________________________________________ Gwen Her. Dianah Field, M.D., ABFM., CAQSM. Primary Care and [place] Instructor of [place] of [facility] of Medicine

## 2020-01-10 NOTE — Assessment & Plan Note (Signed)
Trice returns, he is a pleasant 59 year old male, he has bilateral hand numbness and tingling, we obtained a nerve conduction and EMG that showed bilateral demyelinating median neuropathy at the wrists consistent with carpal tunnel syndrome. Today I performed bilateral median nerve hydrodissection procedures, return to see me in 1 month. He understands that if this does not work we will be proceeding with referral for carpal tunnel release surgery.

## 2020-01-10 NOTE — Assessment & Plan Note (Signed)
Melvin Rich is also having a recurrence of left thumb pain, he has a history of trigger thumb, we did a flexor pollicis longus tendon sheath injection back in July 2020, he has done well since, today I performed a repeat left flexor pollicis longus tendon sheath injection, return in 1 month for this.

## 2020-02-10 ENCOUNTER — Ambulatory Visit: Payer: Self-pay | Admitting: Sports Medicine

## 2020-02-14 IMAGING — DX DG THORACIC SPINE 3V
3 series · 3 of 3 positions shown · non-contrast
Comparison: None.

CLINICAL DATA: Right-sided upper back pain after lifting a box
yesterday.

EXAM:
THORACIC SPINE - 3 VIEWS

[t-spine ap]
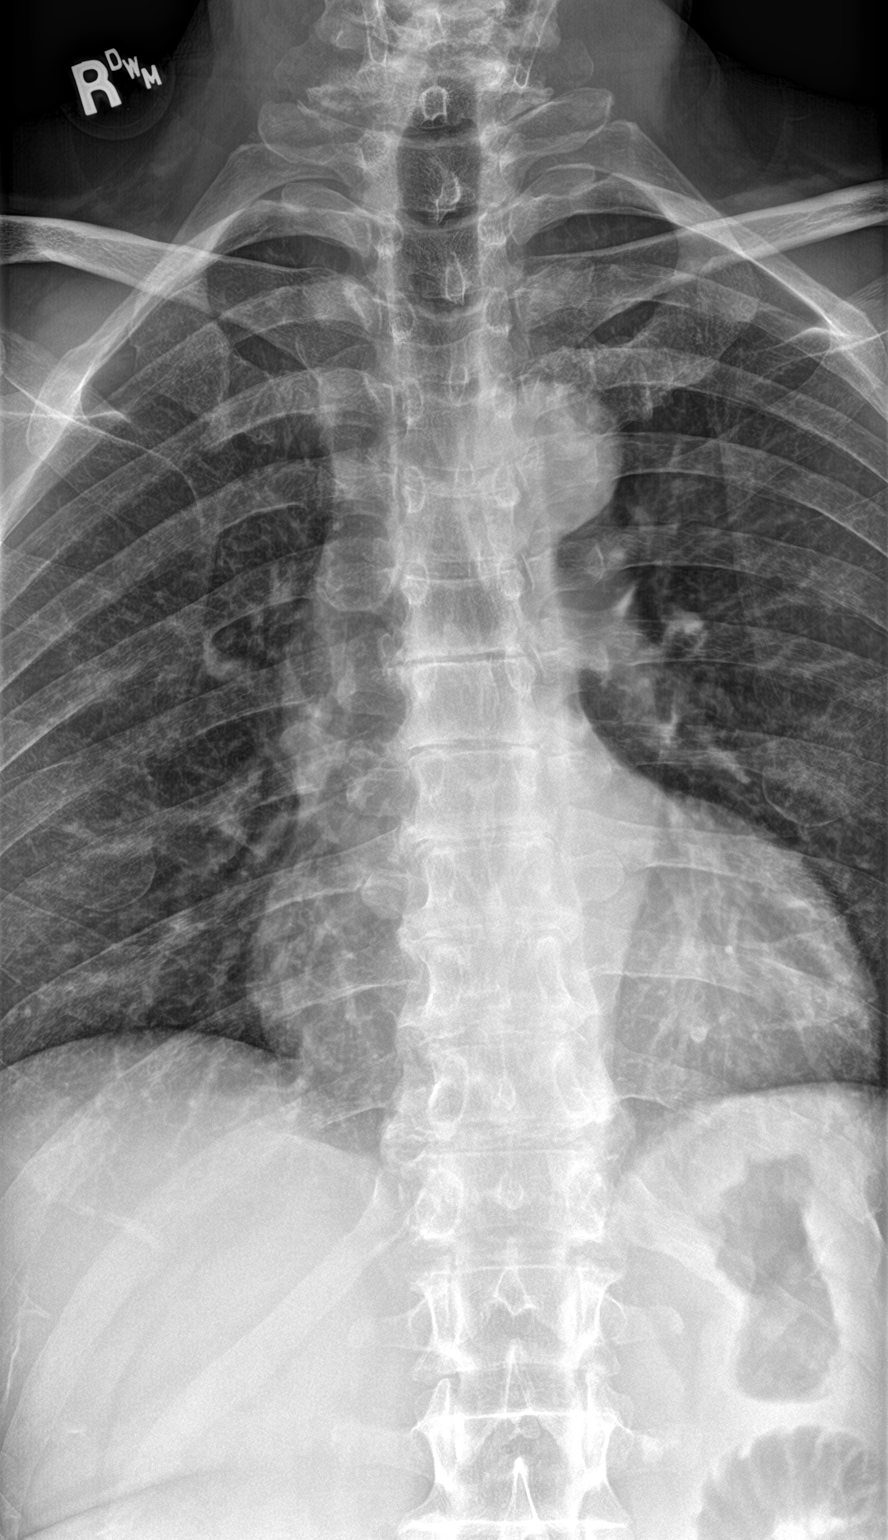

[t-spine lat]
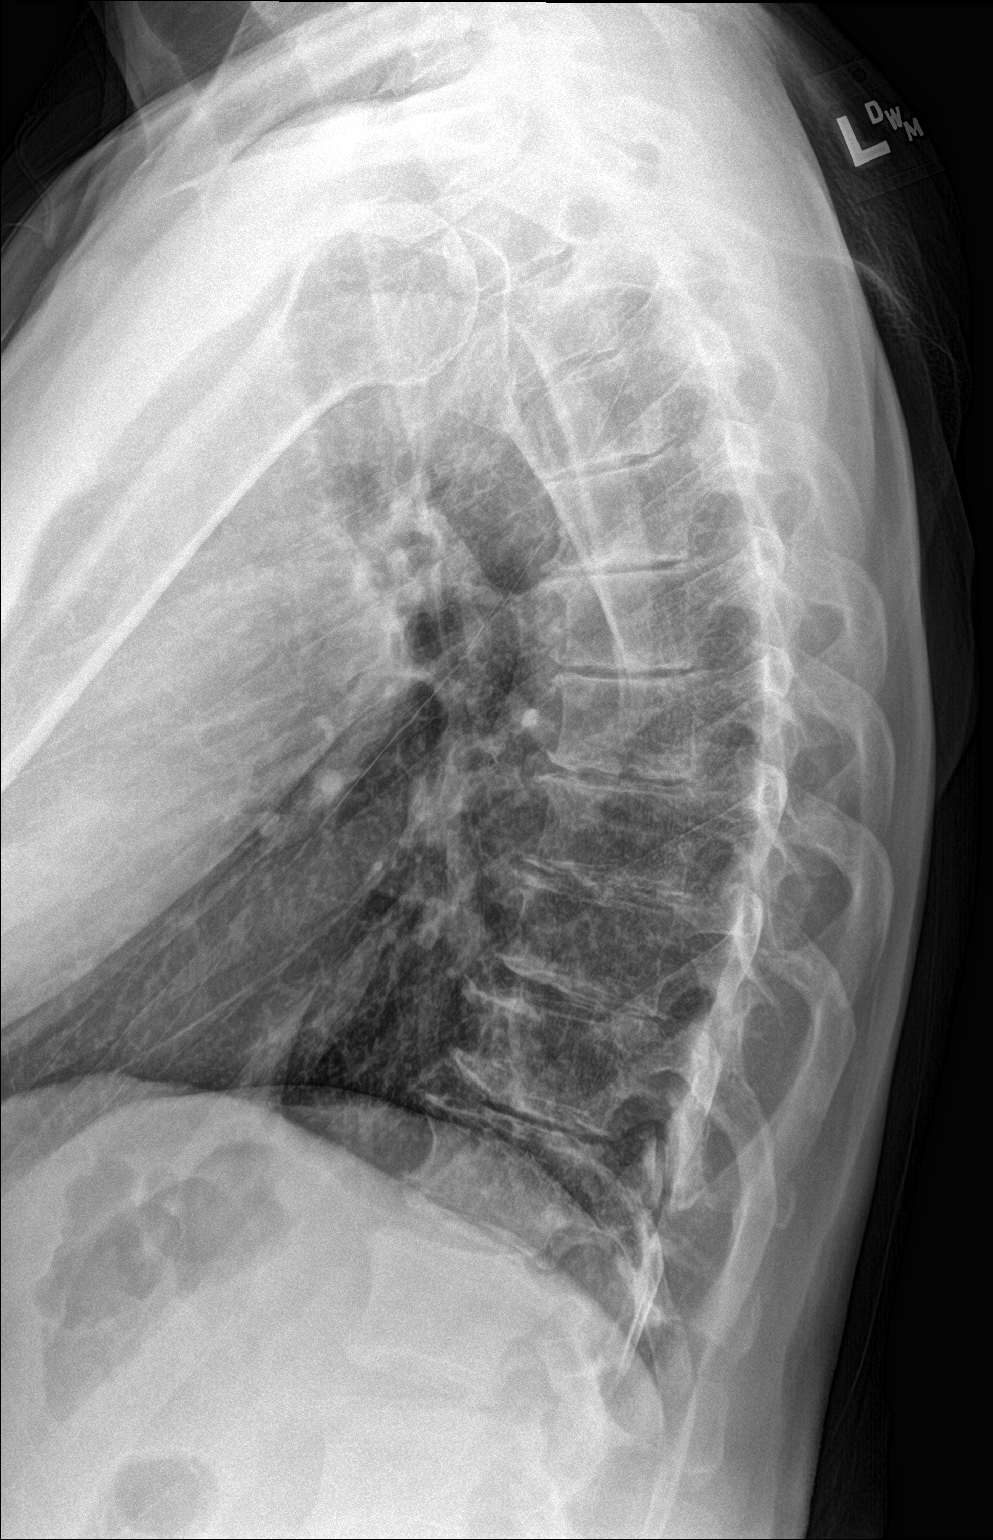

[t-spine swimmers]
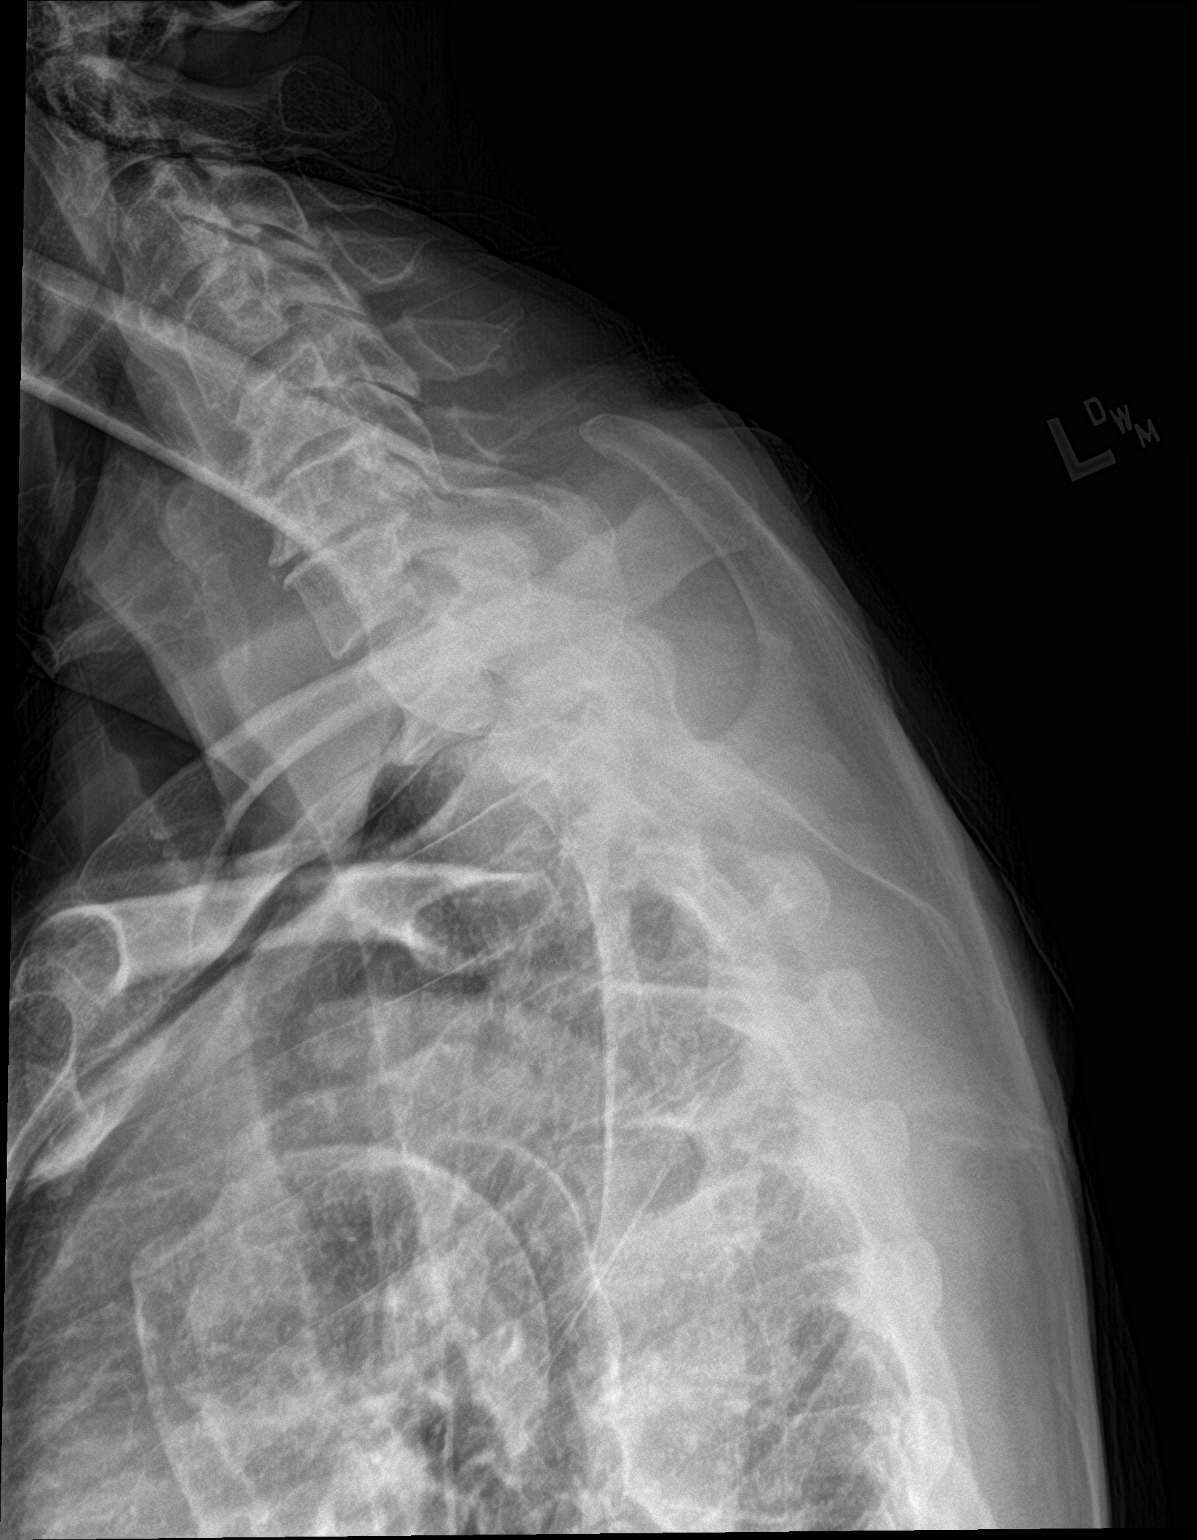

[3 of 3 positions shown; findings below may reference images not displayed]

FINDINGS: Twelve rib-bearing thoracic vertebral bodies. No acute fracture or
subluxation. Vertebral body heights are preserved. Alignment is
normal. Mild-to-moderate multilevel disc height loss and anterior
endplate spurring. Moderate degenerative disc disease at C5-C6 and
C6-C7.
IMPRESSION: 1. No acute osseous abnormality.
2. Mild-to-moderate multilevel degenerative changes throughout the
thoracic spine.

## 2020-04-11 ENCOUNTER — Ambulatory Visit: Payer: Self-pay | Admitting: Cardiology

## 2020-05-28 ENCOUNTER — Ambulatory Visit: Payer: Self-pay | Admitting: Sports Medicine

## 2020-06-01 ENCOUNTER — Ambulatory Visit: Payer: Self-pay | Admitting: Sports Medicine

## 2020-06-01 NOTE — Telephone Encounter (Signed)
Who is this patient's PCP?  We need to keep in mind that we are not going to be treating himfor COVID-19.

## 2020-06-11 ENCOUNTER — Encounter: Payer: Self-pay | Admitting: Family Medicine

## 2020-06-12 DIAGNOSIS — K644 Residual hemorrhoidal skin tags: Secondary | ICD-10-CM | POA: Insufficient documentation

## 2020-06-26 ENCOUNTER — Ambulatory Visit (INDEPENDENT_AMBULATORY_CARE_PROVIDER_SITE_OTHER): Payer: Self-pay

## 2020-06-26 ENCOUNTER — Other Ambulatory Visit: Payer: Self-pay

## 2020-06-26 ENCOUNTER — Ambulatory Visit (INDEPENDENT_AMBULATORY_CARE_PROVIDER_SITE_OTHER): Payer: Self-pay | Admitting: Sports Medicine

## 2020-06-26 DIAGNOSIS — M5030 Other cervical disc degeneration, unspecified cervical region: Secondary | ICD-10-CM

## 2020-06-26 DIAGNOSIS — G5603 Carpal tunnel syndrome, bilateral upper limbs: Secondary | ICD-10-CM

## 2020-06-26 NOTE — Progress Notes (Signed)
    Procedures performed today:    Procedure: Real-time Ultrasound Guided hydrodissection of the left median nerve at the carpal tunnel Device: Samsung HS60 Verbal informed consent obtained.  Time-out conducted.  Noted no overlying erythema, induration, or other signs of local infection.  Skin prepped in a sterile fashion.  Local anesthesia: Topical Ethyl chloride.  With sterile technique and under real time ultrasound guidance: Noted slightly enlarged appearing median nerve.  Using a 25-gauge needle advanced into the carpal tunnel, taking care to avoid intraneural injection I injected medication both superficial to and deep to the median nerve freeing it from surrounding structures, I then redirected the needle deep and injected further medication around the flexor tendons deep within the carpal tunnel for a total of 1 cc kenalog 40, 5 cc 1% lidocaine without epinephrine. Completed without difficulty  Advised to call if fevers/chills, erythema, induration, drainage, or persistent bleeding.  Images permanently stored and available for review in PACS.  Impression: Technically successful ultrasound guided median nerve hydrodissection.  Procedure: Real-time Ultrasound Guided hydrodissection of the right median nerve at the carpal tunnel Device: Samsung HS60 Verbal informed consent obtained.  Time-out conducted.  Noted no overlying erythema, induration, or other signs of local infection.  Skin prepped in a sterile fashion.  Local anesthesia: Topical Ethyl chloride.  With sterile technique and under real time ultrasound guidance: Noted slightly enlarged appearing median nerve.  Using a 25-gauge needle advanced into the carpal tunnel, taking care to avoid intraneural injection I injected medication both superficial to and deep to the median nerve freeing it from surrounding structures, I then redirected the needle deep and injected further medication around the flexor tendons deep within the carpal  tunnel for a total of 1 cc kenalog 40, 5 cc 1% lidocaine without epinephrine. Completed without difficulty  Advised to call if fevers/chills, erythema, induration, drainage, or persistent bleeding.  Images permanently stored and available for review in PACS.  Impression: Technically successful ultrasound guided median nerve hydrodissection.  Independent interpretation of notes and tests performed by another provider:   None.  Brief History, Exam, Impression, and Recommendations:    Bilateral carpal tunnel syndrome Melvin Rich returns, he has EMG/nerve conduction study confirmed median neuropathy at the wrists consistent with carpal tunnel syndrome, we did bilateral Hydro dissections back in May, he had a fantastic response until now, repeat bilateral median nerve Hydro dissections, return as needed.  DDD (degenerative disc disease), cervical History of cervical ACDF, C5-C7 with Dr. Yevette Edwards June 2020, persistent discomfort, I have asked him to discuss this with Dr. Yevette Edwards again.     ___________________________________________ Melvin Rich. Melvin Rich, M.D., ABFM., CAQSM. Primary Care and Sports Medicine [facility]  Adjunct Instructor of Family Medicine  [employer]

## 2020-06-26 NOTE — Assessment & Plan Note (Signed)
History of cervical ACDF, C5-C7 with Dr. Yevette Edwards June 2020, persistent discomfort, I have asked him to discuss this with Dr. Yevette Edwards again.

## 2020-06-26 NOTE — Assessment & Plan Note (Signed)
Melvin Rich returns, he has EMG/nerve conduction study confirmed median neuropathy at the wrists consistent with carpal tunnel syndrome, we did bilateral Hydro dissections back in May, he had a fantastic response until now, repeat bilateral median nerve Hydro dissections, return as needed.

## 2020-09-11 ENCOUNTER — Encounter: Payer: Self-pay | Admitting: Medical-Surgical

## 2020-10-09 ENCOUNTER — Ambulatory Visit: Payer: Self-pay | Admitting: Sports Medicine

## 2020-10-16 ENCOUNTER — Ambulatory Visit (INDEPENDENT_AMBULATORY_CARE_PROVIDER_SITE_OTHER): Payer: Self-pay

## 2020-10-16 ENCOUNTER — Other Ambulatory Visit: Payer: Self-pay

## 2020-10-16 ENCOUNTER — Ambulatory Visit (INDEPENDENT_AMBULATORY_CARE_PROVIDER_SITE_OTHER): Payer: Self-pay | Admitting: Sports Medicine

## 2020-10-16 DIAGNOSIS — M65312 Trigger thumb, left thumb: Secondary | ICD-10-CM

## 2020-10-16 NOTE — Progress Notes (Signed)
    Procedures performed today:    Procedure: Real-time Ultrasound Guided injection of the left flexor pollicis longus tendon sheath Device: Samsung HS60  Verbal informed consent obtained.  Time-out conducted.  Noted no overlying erythema, induration, or other signs of local infection.  Skin prepped in a sterile fashion.  Local anesthesia: Topical Ethyl chloride.  With sterile technique and under real time ultrasound guidance:  Noted flexor tendon nodule, 1/2 cc lidocaine, 1/2 cc kenalog 40 injected easily.   Completed without difficulty  Advised to call if fevers/chills, erythema, induration, drainage, or persistent bleeding.  Images permanently stored and available for review in PACS.  Impression: Technically successful ultrasound guided injection.  Independent interpretation of notes and tests performed by another provider:   None.  Brief History, Exam, Impression, and Recommendations:    Trigger thumb, left thumb This is a pleasant 60 year old male, he has known bilateral trigger thumb, left side was last injected in May of last year. Now having recurrence of discomfort, injected today. Return as needed. Of note he was having some right forearm pain that is likely a muscle strain, we will keep an eye on this.    ___________________________________________ [name]. Benjamin Stain, M.D., ABFM., CAQSM. Primary Care and Sports Medicine [facility]  Adjunct Instructor of Family Medicine  [employer]

## 2020-10-16 NOTE — Assessment & Plan Note (Signed)
This is a pleasant 60 year old male, he has known bilateral trigger thumb, left side was last injected in May of last year. Now having recurrence of discomfort, injected today. Return as needed. Of note he was having some right forearm pain that is likely a muscle strain, we will keep an eye on this.

## 2020-10-19 ENCOUNTER — Other Ambulatory Visit: Payer: Self-pay | Admitting: Medical-Surgical

## 2020-10-19 ENCOUNTER — Encounter: Payer: Self-pay | Admitting: Medical-Surgical

## 2020-10-19 ENCOUNTER — Other Ambulatory Visit: Payer: Self-pay

## 2020-10-19 ENCOUNTER — Ambulatory Visit (INDEPENDENT_AMBULATORY_CARE_PROVIDER_SITE_OTHER): Payer: Self-pay

## 2020-10-19 ENCOUNTER — Ambulatory Visit (INDEPENDENT_AMBULATORY_CARE_PROVIDER_SITE_OTHER): Payer: Self-pay | Admitting: Medical-Surgical

## 2020-10-19 VITALS — BP 106/74 | HR 97 | Temp 98.0°F | Ht 68.0 in | Wt 154.1 lb

## 2020-10-19 DIAGNOSIS — I517 Cardiomegaly: Secondary | ICD-10-CM

## 2020-10-19 DIAGNOSIS — R0609 Other forms of dyspnea: Secondary | ICD-10-CM

## 2020-10-19 DIAGNOSIS — I10 Essential (primary) hypertension: Secondary | ICD-10-CM

## 2020-10-19 DIAGNOSIS — I4510 Unspecified right bundle-branch block: Secondary | ICD-10-CM

## 2020-10-19 DIAGNOSIS — Z7689 Persons encountering health services in other specified circumstances: Secondary | ICD-10-CM

## 2020-10-19 DIAGNOSIS — I428 Other cardiomyopathies: Secondary | ICD-10-CM

## 2020-10-19 DIAGNOSIS — R0600 Dyspnea, unspecified: Secondary | ICD-10-CM

## 2020-10-19 DIAGNOSIS — I420 Dilated cardiomyopathy: Secondary | ICD-10-CM

## 2020-10-19 DIAGNOSIS — I444 Left anterior fascicular block: Secondary | ICD-10-CM

## 2020-10-19 MED ORDER — APIXABAN 5 MG PO TABS: 5.0000 mg | ORAL_TABLET | Freq: Two times a day (BID) | ORAL | 1 refills | Status: DC

## 2020-10-19 NOTE — Progress Notes (Signed)
Subjective:    CC: establish care  HPI: Pleasant 61 year old male presenting to establish care with a new PCP. Has an extensive history of cardiac complications followed by Cardiology. Last visit with them was March of 2021 and patient reports he did not like that provider very much. Was seen at the hospital in [place] last month and prescribed Eliquis for "some sort of fibrillation". Was able to get 1 month of this but notes that the next month will cost $650 and he cannot afford this. Was taking it twice daily but reduced to once daily when his supply started running low. Currently uninsured.   Has brown spots on the bottom of a couple of toes for his left foot. These are painful at times. Has been using Tinactin and thinks there may have been some improvement.   Worsening shortness of breath recently. Now, even regular activities cause dyspnea. Smoking 1/2 ppd cigarettes daily. Has inhalers at home but not sure what the names of them are.   I reviewed the past medical history, family history, social history, surgical history, and allergies today and no changes were needed.  Please see the problem list section below in epic for further details.  Past Medical History: Past Medical History:  Diagnosis Date  . Alcohol use disorder   . BPPV (benign paroxysmal positional vertigo), right 07/27/2018  . Cardiomegaly 11/16/2019  . Chronic active hepatitis (HCC) 04/23/2017   Pt stated that he had the treatment  . Chronic hepatitis C virus infection (HCC) 04/23/2017  . DDD (degenerative disc disease), cervical 04/18/2017   Post C5-C7 ACDF with Dr. Yevette Edwards June 2020  . Degenerative disc disease, cervical   . Degenerative disc disease, cervical   . Eustachian tube dysfunction, bilateral 02/23/2018  . Hearing loss of left ear 07/20/2018  . History of drug use disorder    Heroin, IVDA  . 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 Hypertension   . Hypertension goal BP (blood pressure) < 130/80  04/18/2017  . Left arm pain   . 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 Poor dentition 05/05/2017  . Radiculopathy 02/17/2019  . Rheumatoid arthritis (HCC)   . Rheumatoid arthritis involving both elbows (HCC) 04/18/2017  . Seasonal allergies   . 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 Substance abuse (HCC)   . Transaminitis 04/23/2017  . Trigger thumb, left thumb 03/22/2019   Past Surgical History: Past Surgical History:  Procedure Laterality Date  . ANTERIOR CERVICAL DECOMP/DISCECTOMY FUSION N/A 02/17/2019   Procedure: ANTERIOR CERVICAL DECOMPRESSION FUSION, CERVICAL 5-6, CERVICAL 6-7 WITH INSTRUMENTATION AND ALLOGRAFT;  Surgeon: Estill Bamberg, MD;  Location: [facility] OR;  Service: Orthopedics;  Laterality: N/A;  . HIP SURGERY  2005  . RIGHT/LEFT HEART CATH AND CORONARY ANGIOGRAPHY N/A 12/08/2019   Procedure: RIGHT/LEFT HEART CATH AND CORONARY ANGIOGRAPHY;  Surgeon: Marykay Lex, MD;  Location: [facility] INVASIVE CV LAB;  Service: Cardiovascular;  Laterality: N/A;  . SHOULDER SURGERY  1985   Social History: Social History   Socioeconomic History  . Marital status: Married    Spouse name: Not on file  . Number of children: Not on file  . Years of education: Not on file  . Highest education level: Not on file  Occupational History  . Not on file  Tobacco Use  . Smoking status: Current Every Day Smoker    Packs/day: 0.50    Years: 47.00    Pack years: 23.50    Types: Cigarettes  . Smokeless tobacco: Never  Used  Vaping Use  . Vaping Use: Never used  Substance and Sexual Activity  . Alcohol use: Yes    Alcohol/week: 42.0 - 56.0 standard drinks    Types: 42 - 56 Standard drinks or equivalent per week    Comment: 6-8 drinks/day  . Drug use: Not Currently    Types: Heroin    Comment: sober since 2004  . Sexual activity: Not Currently    Birth control/protection: None  Other Topics Concern  . Not on file  Social History Narrative  . Not on file   Social Determinants of Health   Financial  Resource Strain: Not on file  Food Insecurity: Not on file  Transportation Needs: Not on file  Physical Activity: Not on file  Stress: Not on file  Social Connections: Not on file   Family History: Family History  Adopted: Yes  Family history unknown: Yes   Allergies: No Known Allergies Medications: See med rec.  Review of Systems: See HPI for pertinent positives and negatives.   Objective:    General: Well Developed, well nourished, and in no acute distress.  Neuro: Alert and oriented x3.  HEENT: Normocephalic, atraumatic.  Skin: Warm and dry. Scattered light brown spots in various sizes along the plantar surface of the left foot digits. No evidence of splinter hemorrhages. Cardiac: Regular rate and rhythm, no murmurs rubs or gallops, no lower extremity edema.  Respiratory: Clear to auscultation bilaterally. Not using accessory muscles, speaking in full sentences.  EKG-sinus tachycardia with left axis deviation, right bundle branch block and left ventricular hypertrophy, rate 101. No acute changes but noticeable differences from 10/2019.   Impression and Recommendations:    1. Encounter to establish care Reviewed available information discussed health care concerns with patient. We are requesting records for evaluation of his visit last month.  2. Cardiomegaly 3. 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 Dilated cardiomyopathy (HCC) 6. Nonischemic cardiomyopathy (HCC) In office EKG completed, see above for interpretation. Chest x-ray today. Continue furosemide, Toprol-XL, Diovan, and spironolactone. Continue Eliquis 5 mg twice daily. 1 month free card provided to patient. Ambulatory referral to cardiology placed urgently. With changes in EKG and recent start of anticoagulation, would rather he be seen sooner rather than later. - EKG 12-Lead - DG Chest 2 View; Future - Ambulatory referral to Cardiology  7. Hypertension goal BP (blood pressure) <  130/80 Continue Diovan, spironolactone, Toprol-XL, and furosemide.  8. Dyspnea on exertion Chest x-ray today. Advised patient to trial albuterol inhaler if he has one at home that is not expired. Encouraged smoking cessation.  No follow-ups on file. ___________________________________________ Thayer Ohm, DNP, APRN, FNP-BC Primary Care and Sports Medicine [facility]

## 2020-10-24 ENCOUNTER — Ambulatory Visit (INDEPENDENT_AMBULATORY_CARE_PROVIDER_SITE_OTHER): Payer: Self-pay

## 2020-10-24 ENCOUNTER — Ambulatory Visit: Payer: Self-pay

## 2020-10-24 ENCOUNTER — Other Ambulatory Visit: Payer: Self-pay

## 2020-10-24 ENCOUNTER — Ambulatory Visit (INDEPENDENT_AMBULATORY_CARE_PROVIDER_SITE_OTHER): Payer: Self-pay | Admitting: Sports Medicine

## 2020-10-24 DIAGNOSIS — M65312 Trigger thumb, left thumb: Secondary | ICD-10-CM

## 2020-10-24 DIAGNOSIS — I517 Cardiomegaly: Secondary | ICD-10-CM

## 2020-10-24 DIAGNOSIS — G5603 Carpal tunnel syndrome, bilateral upper limbs: Secondary | ICD-10-CM

## 2020-10-24 NOTE — Progress Notes (Signed)
    Procedures performed today:    Procedure: Real-time Ultrasound Guided hydrodissection of the right median nerve at the carpal tunnel Device: Samsung HS60 Verbal informed consent obtained.  Time-out conducted.  Noted no overlying erythema, induration, or other signs of local infection.  Skin prepped in a sterile fashion.  Local anesthesia: Topical Ethyl chloride.  With sterile technique and under real time ultrasound guidance: Noted slightly enlarged appearing median nerve.  Using a 25-gauge needle advanced into the carpal tunnel, taking care to avoid intraneural injection I injected medication both superficial to and deep to the median nerve freeing it from surrounding structures, I then redirected the needle deep and injected further medication around the flexor tendons deep within the carpal tunnel for a total of 1 cc kenalog 40, 5 cc 1% lidocaine without epinephrine.  Patient had expected numbness after the procedure. Completed without difficulty  Advised to call if fevers/chills, erythema, induration, drainage, or persistent bleeding.  Images permanently stored and available for review in PACS.  Impression: Technically successful ultrasound guided median nerve hydrodissection.  Independent interpretation of notes and tests performed by another provider:   None.  Brief History, Exam, Impression, and Recommendations:    Bilateral carpal tunnel syndrome Allison returns, we have done EMG nerve conduction studies to confirm bilateral median neuropathy at the wrists consistent with carpal tunnel syndrome, he had Hydrodissections back in October and did really well, now having a recurrence of numbness and tingling in the right hand, repeat median nerve Hydro dissection performed today. Return as needed.  Trigger thumb, left thumb I performed a left flexor pollicis longus tendon sheath injection for trigger thumb approximately 8 days ago and he is doing a lot better. We will keep an eye  on this.    ___________________________________________ [name]. Benjamin Stain, M.D., ABFM., CAQSM. Primary Care and Sports Medicine [facility]  Adjunct Instructor of Family Medicine  [employer]

## 2020-10-24 NOTE — Assessment & Plan Note (Signed)
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 Return as needed.

## 2020-10-24 NOTE — Assessment & Plan Note (Signed)
I performed a left flexor pollicis longus tendon sheath injection for trigger thumb approximately 8 days ago and he is doing a lot better. We will keep an eye on this.

## 2020-10-29 ENCOUNTER — Encounter: Payer: Self-pay | Admitting: Medical-Surgical

## 2020-10-30 ENCOUNTER — Other Ambulatory Visit: Payer: Self-pay

## 2020-10-30 MED ORDER — RIVAROXABAN 20 MG PO TABS: 20.0000 mg | ORAL_TABLET | Freq: Every day | ORAL | 0 refills | Status: DC

## 2020-11-15 ENCOUNTER — Encounter: Payer: Self-pay | Admitting: Medical-Surgical

## 2020-11-19 ENCOUNTER — Encounter: Payer: Self-pay | Admitting: Medical-Surgical

## 2020-11-19 ENCOUNTER — Other Ambulatory Visit: Payer: Self-pay | Admitting: Medical-Surgical

## 2020-11-19 MED ORDER — CLOPIDOGREL BISULFATE 75 MG PO TABS: 75.0000 mg | ORAL_TABLET | Freq: Every day | ORAL | 1 refills | Status: DC

## 2020-11-28 ENCOUNTER — Encounter: Payer: Self-pay | Admitting: Medical-Surgical

## 2020-11-28 ENCOUNTER — Ambulatory Visit (INDEPENDENT_AMBULATORY_CARE_PROVIDER_SITE_OTHER): Payer: Self-pay | Admitting: Medical-Surgical

## 2020-11-28 ENCOUNTER — Other Ambulatory Visit: Payer: Self-pay

## 2020-11-28 VITALS — BP 102/69 | HR 113 | Temp 97.8°F | Ht 68.0 in | Wt 153.0 lb

## 2020-11-28 DIAGNOSIS — R0600 Dyspnea, unspecified: Secondary | ICD-10-CM

## 2020-11-28 DIAGNOSIS — I10 Essential (primary) hypertension: Secondary | ICD-10-CM

## 2020-11-28 DIAGNOSIS — R0609 Other forms of dyspnea: Secondary | ICD-10-CM | POA: Insufficient documentation

## 2020-11-28 DIAGNOSIS — I444 Left anterior fascicular block: Secondary | ICD-10-CM

## 2020-11-28 DIAGNOSIS — I428 Other cardiomyopathies: Secondary | ICD-10-CM

## 2020-11-28 DIAGNOSIS — L03012 Cellulitis of left finger: Secondary | ICD-10-CM

## 2020-11-28 DIAGNOSIS — I4510 Unspecified right bundle-branch block: Secondary | ICD-10-CM

## 2020-11-28 MED ORDER — VALSARTAN 320 MG PO TABS: 320.0000 mg | ORAL_TABLET | Freq: Every day | ORAL | 1 refills | Status: DC

## 2020-11-28 MED ORDER — FUROSEMIDE 20 MG PO TABS: 20.0000 mg | ORAL_TABLET | Freq: Every day | ORAL | 1 refills | Status: DC

## 2020-11-28 MED ORDER — SPIRONOLACTONE 25 MG PO TABS: 12.5000 mg | ORAL_TABLET | Freq: Every day | ORAL | 1 refills | Status: DC

## 2020-11-28 MED ORDER — METOPROLOL SUCCINATE ER 25 MG PO TB24: 12.5000 mg | ORAL_TABLET | Freq: Every day | ORAL | 1 refills | Status: DC

## 2020-11-28 MED ORDER — CEPHALEXIN 500 MG PO CAPS: 500.0000 mg | ORAL_CAPSULE | Freq: Two times a day (BID) | ORAL | 0 refills | Status: DC

## 2020-11-28 NOTE — Progress Notes (Signed)
Subjective:    CC: med follow up  HPI: Pleasant 60 year old male presenting today to discuss medications. He notes that he has run out of furosemide, metoprolol, valsartan, and spironolactone. Last doses about 3 days ago. Has noted worsening shortness of breath and hand swelling since running out of his medications. No chest pain, dizziness, headaches, palpitations, or lower extremity edema. Has been taking Aspirin 325mg  daily as instructed but has not picked up the Plavix yet. Plans to go get that today and get it started.   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 Cuts are swollen, red, and tender. No drainage, fever, or chills.   I reviewed the past medical history, family history, social history, surgical history, and allergies today and no changes were needed.  Please see the problem list section below in epic for further details.  Past Medical History: Past Medical History:  Diagnosis Date  . Alcohol use disorder   . BPPV (benign paroxysmal positional vertigo), right 07/27/2018  . Cardiomegaly 11/16/2019  . Chronic active hepatitis (HCC) 04/23/2017   Pt stated that he had the treatment  . Chronic hepatitis C virus infection (HCC) 04/23/2017  . DDD (degenerative disc disease), cervical 04/18/2017   Post C5-C7 ACDF with Dr. 04/20/2017 June 2020  . Degenerative disc disease, cervical   . Degenerative disc disease, cervical   . Eustachian tube dysfunction, bilateral 02/23/2018  . Hearing loss of left ear 07/20/2018  . History of drug use disorder    Heroin, IVDA  . 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 Hypertension   . Hypertension goal BP (blood pressure) < 130/80 04/18/2017  . Left arm pain   . 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 Poor dentition 05/05/2017  . Radiculopathy 02/17/2019  . Rheumatoid arthritis (HCC)   . Rheumatoid arthritis involving both elbows (HCC) 04/18/2017  . Seasonal allergies   . Splenic flexure  syndrome 10/04/2018  . Substance abuse (HCC)   . Transaminitis 04/23/2017  . Trigger thumb, left thumb 03/22/2019   Past Surgical History: Past Surgical History:  Procedure Laterality Date  . ANTERIOR CERVICAL DECOMP/DISCECTOMY FUSION N/A 02/17/2019   Procedure: ANTERIOR CERVICAL DECOMPRESSION FUSION, CERVICAL 5-6, CERVICAL 6-7 WITH INSTRUMENTATION AND ALLOGRAFT;  Surgeon: 02/19/2019, MD;  Location: [facility] OR;  Service: Orthopedics;  Laterality: N/A;  . HIP SURGERY  2005  . RIGHT/LEFT HEART CATH AND CORONARY ANGIOGRAPHY N/A 12/08/2019   Procedure: RIGHT/LEFT HEART CATH AND CORONARY ANGIOGRAPHY;  Surgeon: 02/07/2020, MD;  Location: [facility] INVASIVE CV LAB;  Service: Cardiovascular;  Laterality: N/A;  . SHOULDER SURGERY  1985   Social History: Social History   Socioeconomic History  . Marital status: Married    Spouse name: Not on file  . Number of children: Not on file  . Years of education: Not on file  . Highest education level: Not on file  Occupational History  . Not on file  Tobacco Use  . Smoking status: Current Every Day Smoker    Packs/day: 0.50    Years: 47.00    Pack years: 23.50    Types: Cigarettes  . Smokeless tobacco: Never Used  Vaping Use  . Vaping Use: Never used  Substance and Sexual Activity  . Alcohol use: Yes    Alcohol/week: 42.0 - 56.0 standard drinks    Types: 42 - 56 Standard drinks or equivalent per week    Comment: 6-8 drinks/day  . Drug use: Not  Currently    Types: Heroin    Comment: sober since 2004  . Sexual activity: Not Currently    Birth control/protection: None  Other Topics Concern  . Not on file  Social History Narrative  . Not on file   Social Determinants of Health   Financial Resource Strain: Not on file  Food Insecurity: Not on file  Transportation Needs: Not on file  Physical Activity: Not on file  Stress: Not on file  Social Connections: Not on file   Family History: Family History  Adopted: Yes  Family history unknown:  Yes   Allergies: No Known Allergies Medications: See med rec.  Review of Systems: See HPI for pertinent positives and negatives.   Objective:    General: Well Developed, well nourished, and in no acute distress.  Neuro: Alert and oriented x3.  HEENT: Normocephalic, atraumatic.  Skin: Warm and dry. Scattered small cuts and abrasions to the fingers of both hands with surrounding erythema and swelling. Scabs intact without drainage.  Cardiac: Tachycardic and regular rhythm, 3/6 systolic murmur, no rubs or gallops, no lower extremity edema.  Respiratory: Clear to auscultation bilaterally. Tachypnea, work of breathing increased.  Not using accessory muscles, speaking in full sentences.  Impression and Recommendations:    1. Hypertension goal BP (blood pressure) < 130/80 2. Nonischemic cardiomyopathy (HCC) 3. LAFB (left anterior fascicular block) 4. Right bundle branch block 5. Dyspnea on exertion Resume Furosemide 20mg  daily, toprol XL 12.5mg  daily, Spironolactone 12.5mg  daily, and Valsartan 320mg  daily. With low BP reading today, advised to hold Valsartan dose for BP less than 130/80. Make sure to check BP before taking medications each day. [facility] phone number given to patient on AVS and he was encouraged to call for appointment ASAP.   6. Cellulitis of finger of left hand Keflex 500mg  BID x 7 days.   Return in about 3 months (around 02/28/2021) for chronic disease follow up. ___________________________________________ , DNP, APRN, FNP-BC Primary Care and Sports Medicine [facility] [place]

## 2020-11-28 NOTE — Patient Instructions (Signed)
[facility] at [phone] and [phone]

## 2020-12-03 ENCOUNTER — Encounter: Payer: Self-pay | Admitting: Medical-Surgical

## 2020-12-07 ENCOUNTER — Telehealth: Payer: Self-pay | Admitting: General Practice

## 2020-12-07 NOTE — Telephone Encounter (Signed)
Transition Care Management Unsuccessful Follow-up Telephone Call  Date of discharge and from where:  [facility] 12/05/20  Attempts:  1st Attempt  Reason for unsuccessful TCM follow-up call:  Voice mail full

## 2020-12-10 NOTE — Telephone Encounter (Signed)
Transition Care Management Unsuccessful Follow-up Telephone Call  Date of discharge and from where:  [facility] 12/05/20  Attempts:  2nd Attempt  Reason for unsuccessful TCM follow-up call:  Voice mail full

## 2020-12-11 MED ORDER — DOXYCYCLINE HYCLATE 100 MG PO TABS: 100.0000 mg | ORAL_TABLET | Freq: Two times a day (BID) | ORAL | 0 refills | Status: AC

## 2020-12-12 NOTE — Telephone Encounter (Signed)
Transition Care Management Unsuccessful Follow-up Telephone Call  Date of discharge and from where:  [facility] 12/05/20  Attempts:  3rd Attempt  Reason for unsuccessful TCM follow-up call:  Voice mail full

## 2020-12-31 ENCOUNTER — Telehealth: Payer: Self-pay

## 2020-12-31 NOTE — Telephone Encounter (Signed)
Transition Care Management Follow-up Telephone Call  Date of discharge and from where: 12/28/2020 from [facility]  How have you been since you were released from the hospital? Pt stated that he is feeling okay today. Pt is calling cards to reschedule his appt.   Any questions or concerns? No  Items Reviewed:  Did the pt receive and understand the discharge instructions provided? Yes   Medications obtained and verified? Yes   Other? No   Any new allergies since your discharge? No   Dietary orders reviewed? n/a  Do you have support at home? Yes   Functional Questionnaire: (I = Independent and D = Dependent) ADLs: I  Bathing/Dressing- I  Meal Prep- I  Eating- I  Maintaining continence- I  Transferring/Ambulation- I  Managing Meds- I   Follow up appointments reviewed:   PCP Hospital f/u appt confirmed? Yes  Scheduled to see Christen Butter, NP on 01/14/21.  Specialist Hospital f/u appt confirmed? Yes  calling cards to reschedule his appt.   Are transportation arrangements needed? No   If their condition worsens, is the pt aware to call PCP or go to the Emergency Dept.? Yes  Was the patient provided with contact information for the PCP's office or ED? Yes  Was to pt encouraged to call back with questions or concerns? Yes

## 2021-01-14 DIAGNOSIS — Z9889 Other specified postprocedural states: Secondary | ICD-10-CM | POA: Insufficient documentation

## 2021-01-16 ENCOUNTER — Ambulatory Visit: Payer: Self-pay | Admitting: Medical-Surgical

## 2021-01-16 DIAGNOSIS — I10 Essential (primary) hypertension: Secondary | ICD-10-CM

## 2021-01-16 DIAGNOSIS — R0600 Dyspnea, unspecified: Secondary | ICD-10-CM

## 2021-01-16 DIAGNOSIS — I4510 Unspecified right bundle-branch block: Secondary | ICD-10-CM

## 2021-01-16 DIAGNOSIS — I428 Other cardiomyopathies: Secondary | ICD-10-CM

## 2021-01-16 DIAGNOSIS — I444 Left anterior fascicular block: Secondary | ICD-10-CM

## 2021-01-17 ENCOUNTER — Ambulatory Visit (INDEPENDENT_AMBULATORY_CARE_PROVIDER_SITE_OTHER): Payer: Self-pay | Admitting: Medical-Surgical

## 2021-01-17 ENCOUNTER — Other Ambulatory Visit: Payer: Self-pay

## 2021-01-17 ENCOUNTER — Encounter: Payer: Self-pay | Admitting: Medical-Surgical

## 2021-01-17 VITALS — BP 110/76 | HR 96 | Temp 97.9°F | Ht 68.0 in | Wt 143.0 lb

## 2021-01-17 DIAGNOSIS — I10 Essential (primary) hypertension: Secondary | ICD-10-CM

## 2021-01-17 DIAGNOSIS — R0600 Dyspnea, unspecified: Secondary | ICD-10-CM

## 2021-01-17 DIAGNOSIS — I444 Left anterior fascicular block: Secondary | ICD-10-CM

## 2021-01-17 DIAGNOSIS — I428 Other cardiomyopathies: Secondary | ICD-10-CM

## 2021-01-17 DIAGNOSIS — I4510 Unspecified right bundle-branch block: Secondary | ICD-10-CM

## 2021-01-17 DIAGNOSIS — L84 Corns and callosities: Secondary | ICD-10-CM

## 2021-01-17 DIAGNOSIS — L03012 Cellulitis of left finger: Secondary | ICD-10-CM

## 2021-01-17 DIAGNOSIS — R0609 Other forms of dyspnea: Secondary | ICD-10-CM

## 2021-01-17 DIAGNOSIS — Z1211 Encounter for screening for malignant neoplasm of colon: Secondary | ICD-10-CM

## 2021-01-17 MED ORDER — ALBUTEROL SULFATE HFA 108 (90 BASE) MCG/ACT IN AERS: 2.0000 | INHALATION_SPRAY | Freq: Four times a day (QID) | RESPIRATORY_TRACT | 11 refills | Status: DC | PRN

## 2021-01-17 MED ORDER — CLOBETASOL PROPIONATE 0.05 % EX OINT: 1.0000 "application " | TOPICAL_OINTMENT | Freq: Two times a day (BID) | CUTANEOUS | 1 refills | Status: DC

## 2021-01-17 MED ORDER — FUROSEMIDE 40 MG PO TABS: 40.0000 mg | ORAL_TABLET | Freq: Every day | ORAL | 1 refills | Status: DC

## 2021-01-17 NOTE — Progress Notes (Signed)
Subjective:    CC: hospital follow up  HPI: Pleasant 60 year old male accompanied by his girlfriend presenting for hospital discharge follow up. Since his last visit here in March he has had 2 separate visits to the emergency room.  The first was on April 6 for congestive heart failure.  His second visit was April 29 for acute on chronic congestive heart failure.  Notes that at discharge, they increased his furosemide to 40 mg daily.  He needs a refill on this today.  He was instructed to get in with cardiology but he has missed 2 separate appointments.  He has not contacted them since his ED visit but plans to call them to get scheduled.  Continues to experience shortness of breath on exertion.  With the increased dose of Lasix, his swelling has been better.  Denies fever, chills, chest pain, and GI concerns.  Right hand continues to be an issue.  He has several small wounds over his hand but his most bothersome is the right middle finger on the palmar surface.  He has a large callus at the PIP joint that is red, swollen, and painful.  Regularly picks at it and reports it is hard to get the pus out.  He was treated with Keflex as well as doxycycline recently for cellulitis of the left hand.  Those issues resolved but there was no change in the right hand issue.  I reviewed the past medical history, family history, social history, surgical history, and allergies today and no changes were needed.  Please see the problem list section below in epic for further details.  Past Medical History: Past Medical History:  Diagnosis Date  . Alcohol use disorder   . BPPV (benign paroxysmal positional vertigo), right 07/27/2018  . Cardiomegaly 11/16/2019  . Chronic active hepatitis (HCC) 04/23/2017   Pt stated that he had the treatment  . Chronic hepatitis C virus infection (HCC) 04/23/2017  . DDD (degenerative disc disease), cervical 04/18/2017   Post C5-C7 ACDF with Dr. Yevette Edwards June 2020  . Degenerative  disc disease, cervical   . Degenerative disc disease, cervical   . Eustachian tube dysfunction, bilateral 02/23/2018  . Hearing loss of left ear 07/20/2018  . History of drug use disorder    Heroin, IVDA  . 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 Hypertension   . Hypertension goal BP (blood pressure) < 130/80 04/18/2017  . Left arm pain   . 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 Poor dentition 05/05/2017  . Radiculopathy 02/17/2019  . Rheumatoid arthritis (HCC)   . Rheumatoid arthritis involving both elbows (HCC) 04/18/2017  . Seasonal allergies   . 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 Substance abuse (HCC)   . Transaminitis 04/23/2017  . Trigger thumb, left thumb 03/22/2019   Past Surgical History: Past Surgical History:  Procedure Laterality Date  . ANTERIOR CERVICAL DECOMP/DISCECTOMY FUSION N/A 02/17/2019   Procedure: ANTERIOR CERVICAL DECOMPRESSION FUSION, CERVICAL 5-6, CERVICAL 6-7 WITH INSTRUMENTATION AND ALLOGRAFT;  Surgeon: Estill Bamberg, MD;  Location: [facility] OR;  Service: Orthopedics;  Laterality: N/A;  . HIP SURGERY  2005  . RIGHT/LEFT HEART CATH AND CORONARY ANGIOGRAPHY N/A 12/08/2019   Procedure: RIGHT/LEFT HEART CATH AND CORONARY ANGIOGRAPHY;  Surgeon: Marykay Lex, MD;  Location: [facility] INVASIVE CV LAB;  Service: Cardiovascular;  Laterality: N/A;  . SHOULDER SURGERY  1985   Social History: Social History   Socioeconomic History  . Marital status: Married    Spouse name: Not on  file  . Number of children: Not on file  . Years of education: Not on file  . Highest education level: Not on file  Occupational History  . Not on file  Tobacco Use  . Smoking status: Current Every Day Smoker    Packs/day: 0.50    Years: 47.00    Pack years: 23.50    Types: Cigarettes  . Smokeless tobacco: Never Used  Vaping Use  . Vaping Use: Never used  Substance and Sexual Activity  . Alcohol use: Yes    Alcohol/week: 42.0 - 56.0 standard drinks    Types: 42 - 56 Standard drinks or  equivalent per week    Comment: 6-8 drinks/day  . Drug use: Not Currently    Types: Heroin    Comment: sober since 2004  . Sexual activity: Not Currently    Birth control/protection: None  Other Topics Concern  . Not on file  Social History Narrative  . Not on file   Social Determinants of Health   Financial Resource Strain: Not on file  Food Insecurity: Not on file  Transportation Needs: Not on file  Physical Activity: Not on file  Stress: Not on file  Social Connections: Not on file   Family History: Family History  Adopted: Yes  Family history unknown: Yes   Allergies: No Known Allergies Medications: See med rec.  Review of Systems: See HPI for pertinent positives and negatives.   Objective:    General: Well Developed, well nourished, and in no acute distress.  Neuro: Alert and oriented x3.  HEENT: Normocephalic, atraumatic.  Skin: Warm and dry.  Large callus of the right middle finger at the PIP joint with a small open area in the crease and surrounding erythema, tender to palpation, no drainage Cardiac: Regular rate and rhythm, + murmur, - rubs or gallops, no lower extremity edema.  Respiratory: Clear to auscultation bilaterally. Not using accessory muscles, speaking in full sentences.  Impression and Recommendations:    1. Hypertension goal BP (blood pressure) < 130/80 Well-controlled on current regimen.  Continue to check blood pressure regularly at home and take antihypertensives as prescribed.  2. Nonischemic cardiomyopathy (HCC) 3. LAFB (left anterior fascicular block) 4. Right bundle branch block 5. Dyspnea on exertion Continue medications as prescribed.  Sending furosemide 40 mg to the pharmacy to reflect the new dose.  Strongly recommend contacting cardiology.  With the severity of his condition, he will need them on board for optimal management.  With 49 years as a smoker, he likely has an element of COPD that has not been formally diagnosed.  Sending  in an albuterol inhaler to see if this is helpful.  Advised to limit fluid intake and cut back on alcohol consumption.  6. Colon cancer screening Discussed screening options.  Ordering Cologuard today. - Cologuard  7. Skin callus Consulted with Dr. Karie Schwalbe.  This was not responsive to antibiotics.  Apply clobetasol ointment topically to the affected area twice daily.  Advised to occlude the area once the ointment is applied for approximately 1 hour, recommend using latex gloves.  Return in about 3 months (around 04/19/2021) for chronic disease follow up. ___________________________________________ Thayer Ohm, DNP, APRN, FNP-BC Primary Care and Sports Medicine [facility] [place]

## 2021-02-08 ENCOUNTER — Telehealth: Payer: Self-pay

## 2021-02-08 NOTE — Telephone Encounter (Signed)
Transition Care Management Unsuccessful Follow-up Telephone Call  Date of discharge and from where:  02/07/2021 from [facility]  Attempts:  1st Attempt  Reason for unsuccessful TCM follow-up call:  Unable to leave message

## 2021-02-11 NOTE — Telephone Encounter (Signed)
Transition Care Management Unsuccessful Follow-up Telephone Call  Date of discharge and from where:  02/07/2021 from [facility]  Attempts:  2nd Attempt  Reason for unsuccessful TCM follow-up call:  Unable to leave message

## 2021-02-12 ENCOUNTER — Other Ambulatory Visit: Payer: Self-pay | Admitting: Medical-Surgical

## 2021-02-12 ENCOUNTER — Encounter: Payer: Self-pay | Admitting: Medical-Surgical

## 2021-02-12 ENCOUNTER — Other Ambulatory Visit: Payer: Self-pay

## 2021-02-12 ENCOUNTER — Ambulatory Visit (INDEPENDENT_AMBULATORY_CARE_PROVIDER_SITE_OTHER): Payer: Self-pay | Admitting: Sports Medicine

## 2021-02-12 ENCOUNTER — Ambulatory Visit (INDEPENDENT_AMBULATORY_CARE_PROVIDER_SITE_OTHER): Payer: Self-pay

## 2021-02-12 DIAGNOSIS — G5603 Carpal tunnel syndrome, bilateral upper limbs: Secondary | ICD-10-CM

## 2021-02-12 DIAGNOSIS — I5021 Acute systolic (congestive) heart failure: Secondary | ICD-10-CM

## 2021-02-12 DIAGNOSIS — I444 Left anterior fascicular block: Secondary | ICD-10-CM

## 2021-02-12 DIAGNOSIS — I428 Other cardiomyopathies: Secondary | ICD-10-CM

## 2021-02-12 DIAGNOSIS — I517 Cardiomegaly: Secondary | ICD-10-CM

## 2021-02-12 DIAGNOSIS — R0609 Other forms of dyspnea: Secondary | ICD-10-CM

## 2021-02-12 DIAGNOSIS — I420 Dilated cardiomyopathy: Secondary | ICD-10-CM

## 2021-02-12 DIAGNOSIS — I4510 Unspecified right bundle-branch block: Secondary | ICD-10-CM

## 2021-02-12 DIAGNOSIS — I10 Essential (primary) hypertension: Secondary | ICD-10-CM

## 2021-02-12 NOTE — Telephone Encounter (Signed)
Transition Care Management Unsuccessful Follow-up Telephone Call  Date of discharge and from where:  02/07/2021 from [facility]  Attempts:  3rd Attempt  Reason for unsuccessful TCM follow-up call:  Unable to reach patient

## 2021-02-12 NOTE — Progress Notes (Signed)
    Procedures performed today:    Procedure: Real-time Ultrasound Guided hydrodissection of the right median nerve at the carpal tunnel Device: Samsung HS60 Verbal informed consent obtained.  Time-out conducted.  Noted no overlying erythema, induration, or other signs of local infection.  Skin prepped in a sterile fashion.  Local anesthesia: Topical Ethyl chloride.  With sterile technique and under real time ultrasound guidance: Noted large median nerve.  We needed to use a medial approach due to the radial artery being in the typical approach pathway.  Using a 25-gauge needle advanced into the carpal tunnel, taking care to avoid intraneural injection I injected medication both superficial to and deep to the median nerve freeing it from surrounding structures, I then redirected the needle deep and injected further medication around the flexor tendons deep within the carpal tunnel for a total of 1 cc kenalog 40, 5 cc 1% lidocaine without epinephrine. Completed without difficulty  Advised to call if fevers/chills, erythema, induration, drainage, or persistent bleeding.  Images permanently stored and available for review in PACS.  Impression: Technically successful ultrasound guided median nerve hydrodissection.  Independent interpretation of notes and tests performed by another provider:   None.  Brief History, Exam, Impression, and Recommendations:    Bilateral carpal tunnel syndrome Known bilateral carpal tunnel syndrome, last median nerve hydrodissection was in February of this year, recurrence of right sided paresthesias, repeat right side median nerve Hydro dissection today, return as needed. Of note a medial approach is needed on the right wrist due to the radial artery and vena comitans being in the approach pathway.  Chronic process with exacerbation and pharmacologic intervention.    ___________________________________________ [name]. Benjamin Stain, M.D., ABFM.,  CAQSM. Primary Care and Sports Medicine [facility]  Adjunct Instructor of Family Medicine  [employer]

## 2021-02-12 NOTE — Progress Notes (Signed)
Patient in office for an appointment with Dr. Karie Schwalbe. After completing his appointment, he requested that I refer him to Cardiology at the [facility] and Vascular center in [place]. Referral placed as requested.   Thayer Ohm, DNP, APRN, FNP-BC [facility]

## 2021-02-12 NOTE — Assessment & Plan Note (Addendum)
Known bilateral carpal tunnel syndrome, last median nerve hydrodissection was in February of this year, recurrence of right sided paresthesias, repeat right side median nerve Hydro dissection today, return as needed. Of note a medial approach is needed on the right wrist due to the radial artery and vena comitans being in the approach pathway.  Chronic process with exacerbation and pharmacologic intervention.

## 2021-02-18 ENCOUNTER — Telehealth: Payer: Self-pay

## 2021-02-18 ENCOUNTER — Telehealth: Payer: Self-pay | Admitting: General Practice

## 2021-02-18 NOTE — Telephone Encounter (Signed)
FYI:: Pt's significant other Gigi Gin called stating that they had no heard from [facility] about the referral. She said that she tried to call them and schedule the appt and they told her they never received the referral. Looking in his chart, everything was faxed on 02/12/21. I called them and they said that they had not received the info. While on the phone, we built a chart for the pt and got an appt scheduled for him on 02/26/21 at 11:30, arrival time of 11:00. The appt is at the [id number] location. Referral, demographics, and copies of his office notes through our office re-faxed but to a different number, [phone]. Fax confirmation received that the records went through fine. I called Peggy back and shared the appt info with her and she repeated the info back to me. No further questions or concerns at this time.

## 2021-02-18 NOTE — Telephone Encounter (Signed)
Transition Care Management Unsuccessful Follow-up Telephone Call  Date of discharge and from where:  [facility] 02/17/21   Attempts:  1st Attempt  Reason for unsuccessful TCM follow-up call:  Left voice message

## 2021-02-19 NOTE — Telephone Encounter (Signed)
Transition Care Management Unsuccessful Follow-up Telephone Call  Date of discharge and from where:  02/17/21 from [facility]  Attempts:  2nd Attempt  Reason for unsuccessful TCM follow-up call:  Left voice message

## 2021-02-20 NOTE — Telephone Encounter (Signed)
Transition Care Management Unsuccessful Follow-up Telephone Call  Date of discharge and from where:  02/17/21 from [facility]  Attempts:  3rd Attempt  Reason for unsuccessful TCM follow-up call:  Left voice message

## 2021-02-28 DIAGNOSIS — I5042 Chronic combined systolic (congestive) and diastolic (congestive) heart failure: Secondary | ICD-10-CM | POA: Insufficient documentation

## 2021-02-28 DIAGNOSIS — F1010 Alcohol abuse, uncomplicated: Secondary | ICD-10-CM | POA: Insufficient documentation

## 2021-02-28 DIAGNOSIS — I5043 Acute on chronic combined systolic (congestive) and diastolic (congestive) heart failure: Secondary | ICD-10-CM | POA: Insufficient documentation

## 2021-02-28 DIAGNOSIS — F1020 Alcohol dependence, uncomplicated: Secondary | ICD-10-CM | POA: Insufficient documentation

## 2021-03-18 ENCOUNTER — Telehealth: Payer: Self-pay

## 2021-03-18 NOTE — Telephone Encounter (Signed)
FYI::Fax received from [employer] stating that pt has not completed the Cologuard test that was ordered. At [facility] request, I called and LVM reminding him that we need to get this test done. Instructed him to call back with any questions or concerns.

## 2021-04-08 ENCOUNTER — Telehealth: Payer: Self-pay | Admitting: General Practice

## 2021-04-08 NOTE — Telephone Encounter (Signed)
Transition Care Management Unsuccessful Follow-up Telephone Call  Date of discharge and from where:  04/06/21 from [facility]  Attempts:  1st Attempt  Reason for unsuccessful TCM follow-up call:  Left voice message

## 2021-04-09 ENCOUNTER — Ambulatory Visit (INDEPENDENT_AMBULATORY_CARE_PROVIDER_SITE_OTHER): Payer: Self-pay | Admitting: Medical-Surgical

## 2021-04-09 ENCOUNTER — Encounter: Payer: Self-pay | Admitting: Medical-Surgical

## 2021-04-09 VITALS — BP 123/87 | HR 106 | Resp 20 | Wt 145.4 lb

## 2021-04-09 DIAGNOSIS — Z789 Other specified health status: Secondary | ICD-10-CM

## 2021-04-09 DIAGNOSIS — Z09 Encounter for follow-up examination after completed treatment for conditions other than malignant neoplasm: Secondary | ICD-10-CM

## 2021-04-09 DIAGNOSIS — F1090 Alcohol use, unspecified, uncomplicated: Secondary | ICD-10-CM

## 2021-04-09 DIAGNOSIS — I5021 Acute systolic (congestive) heart failure: Secondary | ICD-10-CM

## 2021-04-09 MED ORDER — HYDROXYZINE PAMOATE 25 MG PO CAPS: 25.0000 mg | ORAL_CAPSULE | Freq: Three times a day (TID) | ORAL | 1 refills | Status: DC | PRN

## 2021-04-09 MED ORDER — CHLORDIAZEPOXIDE HCL 25 MG PO CAPS: ORAL_CAPSULE | ORAL | 0 refills | Status: AC

## 2021-04-09 MED ORDER — TORSEMIDE 100 MG PO TABS: 150.0000 mg | ORAL_TABLET | Freq: Every day | ORAL | 1 refills | Status: DC

## 2021-04-09 NOTE — Progress Notes (Signed)
  HPI with pertinent ROS:   CC: Hospital discharge follow-up  HPI: Pleasant 60 year old male accompanied by his significant other presenting today for hospital discharge follow-up after being seen at [facility] emergency room on 04/06/2021 for acute congestive heart failure.  He was discharged from the emergency room with instructions to take his Lasix twice daily for the next couple of days then resume do his regular dosing.  He has recently been seen by [facility] cardiology in [place] as well as Claris Gower.  According to their notes, it is felt that his cardiomyopathy is related to alcohol use.  The patient has attempted to quit alcohol use but unfortunately relapsed.  Today, he reports no improvement in his shortness of breath.  He is unable to lie flat and has been unable to sleep for the last 3 days.  His last alcoholic beverage was 3 to 4 days ago when he has stopped smoking as well.  He is working on fluid restriction but notes that some days he does go over the 1500 mL limit.  He has taken Lasix as prescribed but has increased his dose to up to 100 mg yesterday since the 80 mg was not working.  Having difficulty with activities of daily living and is dyspneic even at rest.  Reports feeling jittery and irritable with headaches.  When asked about hallucinations, he said maybe.  Denies fever, chills, and cough.  Requesting a referral to a heart failure specialist in [facility] named Dr. Devonne Doughty.  Patient very frustrated and would like to return to some level of function.  States that he just wants to be comfortable.  I reviewed the past medical history, family history, social history, surgical history, and allergies today and no changes were needed.  Please see the problem list section below in epic for further details.    Physical exam:   General: Well Developed, well nourished, and in no acute distress.  Neuro: Alert and oriented x3.  HEENT: Normocephalic, atraumatic.   Skin: Warm and dry. Cardiac: Regular rate and rhythm, no murmurs rubs or gallops, no lower extremity edema.  Respiratory: Clear to auscultation bilaterally. Not using accessory muscles, speaking in full sentences.  Impression and Recommendations:    1. Hospital discharge follow-up 2. Acute systolic congestive heart failure (HCC) Unfortunately, elevated doses of furosemide do not seem to be having much effect.  Discontinue furosemide.  Switch to torsemide 150 mg daily.  Recommend adhering to fluid restriction of 1500 mL daily or less.  His current symptoms seem to be exacerbated by alcohol withdrawal so we will do an extended taper of Librium.  After Librium is complete, start hydroxyzine 25-50 mg 3 times daily as needed for anxiety.  Referral entered to Dr. Devonne Doughty as requested.  Discussed with patient and his significant other the option of palliative/hospice care to ensure that he is kept comfortable.  Patient verbalizes that he is not quite ready for that yet but he will keep it in mind and let me know when he is.  Return in about 1 week (around 04/16/2021) for shortnes of breath follow up. ___________________________________________ Thayer Ohm, DNP, APRN, FNP-BC Primary Care and Sports Medicine [facility] [place]

## 2021-04-10 NOTE — Telephone Encounter (Signed)
Transition Care Management Unsuccessful Follow-up Telephone Call  Date of discharge and from where:  04/06/21 from [facility]  Attempts:  2nd Attempt  Reason for unsuccessful TCM follow-up call:  Left voice message Patient does have OV scheduled for 04/16/21 with Christen Butter, NP

## 2021-04-12 DIAGNOSIS — I4729 Other ventricular tachycardia: Secondary | ICD-10-CM | POA: Insufficient documentation

## 2021-04-12 DIAGNOSIS — G9001 Carotid sinus syncope: Secondary | ICD-10-CM | POA: Insufficient documentation

## 2021-04-12 DIAGNOSIS — G9340 Encephalopathy, unspecified: Secondary | ICD-10-CM | POA: Insufficient documentation

## 2021-04-12 DIAGNOSIS — I493 Ventricular premature depolarization: Secondary | ICD-10-CM | POA: Insufficient documentation

## 2021-04-12 DIAGNOSIS — I509 Heart failure, unspecified: Secondary | ICD-10-CM | POA: Insufficient documentation

## 2021-04-12 NOTE — Telephone Encounter (Signed)
Transition Care Management Unsuccessful Follow-up Telephone Call  Date of discharge and from where:  04/06/21 from [facility]  Attempts:  3rd Attempt  Reason for unsuccessful TCM follow-up call:  Left voice message Patient has appt with Christen Butter, NP on 04/16/21.

## 2021-04-13 DIAGNOSIS — M069 Rheumatoid arthritis, unspecified: Secondary | ICD-10-CM | POA: Insufficient documentation

## 2021-04-16 ENCOUNTER — Ambulatory Visit (INDEPENDENT_AMBULATORY_CARE_PROVIDER_SITE_OTHER): Payer: Medicaid Other | Admitting: Medical-Surgical

## 2021-04-16 DIAGNOSIS — R0602 Shortness of breath: Secondary | ICD-10-CM

## 2021-04-16 DIAGNOSIS — Z5329 Procedure and treatment not carried out because of patient's decision for other reasons: Secondary | ICD-10-CM

## 2021-04-16 NOTE — Progress Notes (Signed)
No show for appointment.  Dorris Vangorder L. Kalei Meda, DNP, APRN, FNP-BC [facility] Primary Care and Sports Medicine  

## 2021-04-30 DIAGNOSIS — I509 Heart failure, unspecified: Secondary | ICD-10-CM | POA: Insufficient documentation

## 2021-05-01 ENCOUNTER — Encounter: Payer: Self-pay | Admitting: Family Medicine

## 2021-05-03 ENCOUNTER — Telehealth: Payer: Self-pay | Admitting: General Practice

## 2021-05-03 NOTE — Telephone Encounter (Signed)
Transition Care Management Unsuccessful Follow-up Telephone Call  Date of discharge and from where:  05/01/21 from [facility]   Attempts:  1st Attempt  Reason for unsuccessful TCM follow-up call:  Unable to leave message

## 2021-05-07 ENCOUNTER — Ambulatory Visit: Payer: Medicaid Other | Admitting: Sports Medicine

## 2021-05-07 ENCOUNTER — Telehealth: Payer: Self-pay

## 2021-05-07 NOTE — Telephone Encounter (Signed)
Transition Care Management Unsuccessful Follow-up Telephone Call  Date of discharge and from where:  05/01/2021 from [facility]  Attempts:  2nd Attempt  Reason for unsuccessful TCM follow-up call:  Unable to leave message

## 2021-05-07 NOTE — Telephone Encounter (Signed)
Transition Care Management Unsuccessful Follow-up Telephone Call  Date of discharge and from where:  05/03/2021 from [facility].   Attempts:  1st Attempt  Reason for unsuccessful TCM follow-up call:  Unable to reach patient

## 2021-05-09 ENCOUNTER — Telehealth: Payer: Self-pay

## 2021-05-09 ENCOUNTER — Emergency Department
Admission: EM | Admit: 2021-05-09 | Discharge: 2021-05-09 | Disposition: A | Discharge status: Home / Self Care | Payer: Self-pay | Source: Home / Self Care

## 2021-05-09 ENCOUNTER — Other Ambulatory Visit: Payer: Self-pay

## 2021-05-09 ENCOUNTER — Encounter: Payer: Self-pay | Admitting: Emergency Medicine

## 2021-05-09 DIAGNOSIS — R300 Dysuria: Secondary | ICD-10-CM

## 2021-05-09 DIAGNOSIS — R0602 Shortness of breath: Secondary | ICD-10-CM

## 2021-05-09 DIAGNOSIS — R5383 Other fatigue: Secondary | ICD-10-CM

## 2021-05-09 HISTORY — DX: COVID-19: U07.1

## 2021-05-09 LAB — POCT URINALYSIS DIP (MANUAL ENTRY)
Bilirubin, UA: NEGATIVE
Blood, UA: NEGATIVE
Glucose, UA: NEGATIVE mg/dL
Ketones, POC UA: NEGATIVE mg/dL
Leukocytes, UA: NEGATIVE
Nitrite, UA: NEGATIVE
Protein Ur, POC: 100 mg/dL — AB
Spec Grav, UA: 1.015 (ref 1.010–1.025)
Urobilinogen, UA: 1 E.U./dL
pH, UA: 5.5 (ref 5.0–8.0)

## 2021-05-09 NOTE — Discharge Instructions (Addendum)
Advised patient/girlfriend we will follow-up with urine culture results once received.  Advised patient/girlfriend we have notified EMS as requested and will transport him to [facility].

## 2021-05-09 NOTE — ED Triage Notes (Signed)
Patient here in wheelchair; has been in urgent cares daily for past 2 days; states not totally lucid and has shortness of breath; dx with covid 6 days ago and has had complete doses for 5 days of covid medication. Seems drowsy, answering questions but haltingly. Girlfriend here for support. States was told he should be checked for UTI by phone call from [facility] because that could contribute to confusion.

## 2021-05-09 NOTE — ED Provider Notes (Signed)
[facility]    CSN: [id number] Arrival date & time: 05/09/21  1236      History   Chief Complaint Chief Complaint  Patient presents with   Shortness of Breath        Fatigue   Altered Mental Status   Dysuria    HPI Melvin Rich is a 60 y.o. male.   HPI 60 year old male presents today for possible urinary tract infection.  Patient has significant significant past medical history and was recently evaluated for COVID-19 at [facility] ED on 09/02 and yesterday, 05/08/2021.  Additionally, patient complains of shortness of breath and have safety concerns about patient going home by himself.  Patient is accompanied today by his girlfriend whom with patient request to be transported back to emergency room for further evaluation.  SPO2 ranging 86 to 95% during triage today, currently placed on 2 L of oxygen prior to exam.  Patient reports that he is not totally lucid and shortness of breath.  Patient advised to come here as was informed by [facility] that possible urinary tract infection can cause confusion.  PMH significant for acute on chronic systolic heart failure, history of IV drug user, heavy alcohol consumption, and chronic active hepatitis C.  Past Medical History:  Diagnosis Date   Alcohol use disorder    BPPV (benign paroxysmal positional vertigo), right 07/27/2018   Cardiomegaly 11/16/2019   Chronic active hepatitis (HCC) 04/23/2017   Pt stated that he had the treatment   Chronic hepatitis C virus infection (HCC) 04/23/2017   COVID-19    DDD (degenerative disc disease), cervical 04/18/2017   Post C5-C7 ACDF with Dr. 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   Poor dentition 05/05/2017   Radiculopathy 02/17/2019   Rheumatoid arthritis (HCC)    Rheumatoid arthritis involving both elbows (HCC) 04/18/2017   Seasonal allergies    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   Substance abuse (HCC)    Transaminitis 04/23/2017   Trigger thumb, left thumb 03/22/2019    Patient Active Problem List   Diagnosis Date Noted   Alcohol dependence (HCC) 02/28/2021   Chronic combined systolic and diastolic heart failure (HCC) 02/28/2021   History of cardiac cath 01/14/2021   Dyspnea on exertion 11/28/2020   External hemorrhoid, bleeding 06/12/2020   Bilateral carpal tunnel syndrome 01/04/2020   Depressed left ventricular ejection fraction 12/30/2019   Nonischemic cardiomyopathy (HCC) 12/30/2019   Dilated cardiomyopathy (HCC) 11/30/2019   Heavy alcohol consumption 11/30/2019   Abnormal EKG 11/30/2019   Right bundle branch block 11/30/2019   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   Cardiomegaly 11/16/2019   Trigger thumb, left thumb 03/22/2019   Status post cervical spinal fusion 02/22/2019   Chronic neck pain 02/22/2019   Irritability and anger 02/22/2019   History of opioid abuse (HCC) 02/22/2019   Radiculopathy 02/17/2019   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   Otosclerosis, 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   BPPV (benign paroxysmal positional vertigo), right 07/27/2018   Hearing loss of left ear 07/20/2018   Eustachian tube dysfunction, bilateral 02/23/2018  Tobacco dependence 12/26/2017   Sleep disturbance 12/26/2017   Encounter for medication management 12/26/2017   Paresthesias 12/26/2017   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   Colon cancer screening 08/03/2017   Poor dentition 05/05/2017   Lack of immunity to hepatitis B virus demonstrated by serologic test 04/24/2017   Transaminitis 04/23/2017   Hypertension 04/18/2017   Drinks three beers  per day 04/18/2017   DDD (degenerative disc disease), cervical 04/18/2017   Rheumatoid arthritis involving both elbows (HCC) 04/18/2017   H/O drug abuse (HCC) 07/27/2012    Past Surgical History:  Procedure Laterality Date   ANTERIOR CERVICAL DECOMP/DISCECTOMY FUSION N/A 02/17/2019   Procedure: ANTERIOR CERVICAL DECOMPRESSION FUSION, CERVICAL 5-6, CERVICAL 6-7 WITH INSTRUMENTATION AND ALLOGRAFT;  Surgeon: Estill Bamberg, MD;  Location: [facility] OR;  Service: Orthopedics;  Laterality: N/A;   HIP SURGERY  2005   RIGHT/LEFT HEART CATH AND CORONARY ANGIOGRAPHY N/A 12/08/2019   Procedure: RIGHT/LEFT HEART CATH AND CORONARY ANGIOGRAPHY;  Surgeon: Marykay Lex, MD;  Location: [facility] INVASIVE CV LAB;  Service: Cardiovascular;  Laterality: N/A;   SHOULDER SURGERY  1985       Home Medications    Prior to Admission medications   Medication Sig Start Date End Date Taking? Authorizing Provider  LORazepam (ATIVAN) 1 MG tablet Take by mouth. 05/08/21 05/18/21 Yes [provider]  albuterol (VENTOLIN HFA) 108 (90 Base) MCG/ACT inhaler Inhale 2 puffs into the lungs every 6 (six) hours as needed for wheezing. Patient not taking: Reported on 04/09/2021 01/17/21   Christen Butter, NP  aspirin 81 MG EC tablet Take by mouth. 02/18/21   [provider]  clobetasol ointment (TEMOVATE) 0.05 % Apply 1 application topically 2 (two) times daily. 01/17/21   Christen Butter, NP  clopidogrel (PLAVIX) 75 MG tablet Take 1 tablet (75 mg total) by mouth daily. 11/19/20   Christen Butter, NP  hydrOXYzine (VISTARIL) 25 MG capsule Take 1-2 capsules (25-50 mg total) by mouth every 8 (eight) hours as needed. 04/09/21   Christen Butter, NP  lisinopril (ZESTRIL) 5 MG tablet Take by mouth. 02/25/21   [provider]  metoprolol succinate (TOPROL XL) 25 MG 24 hr tablet Take 0.5 tablets (12.5 mg total) by mouth daily. 11/28/20   Christen Butter, NP  Multiple Vitamins-Minerals (MULTIVITAMIN WITH MINERALS) tablet Take 1 tablet by mouth daily.     [provider]  NASAL SALINE NA Place 1 spray into the nose as needed (Congestion).    [provider]  Omega-3 Fatty Acids (FISH OIL PO) Take 6,000 mg by mouth daily.     [provider]  PAXLOVID, 300/100, 20 x 150 MG & 10 x 100MG  TBPK Take by mouth. 05/03/21   [provider]  potassium chloride SA (KLOR-CON) 20 MEQ tablet Take 20 mEq by mouth daily. 04/06/21   [provider]  spironolactone (ALDACTONE) 25 MG tablet Take 0.5 tablets (12.5 mg total) by mouth daily. 11/28/20 02/26/21  02/28/21, NP  torsemide (DEMADEX) 100 MG tablet Take 1.5 tablets (150 mg total) by mouth daily. 04/09/21 05/09/21  07/09/21, NP    Family History Family History  Adopted: Yes  Family history unknown: Yes    Social History Social History   Tobacco Use   Smoking status: Every Day    Packs/day: 0.50    Years: 47.00    Pack years: 23.50    Types: Cigarettes   Smokeless tobacco: Never  Vaping Use   Vaping Use: Never used  Substance Use Topics  Alcohol use: Yes    Alcohol/week: 42.0 - 56.0 standard drinks    Types: 42 - 56 Standard drinks or equivalent per week    Comment: 6-8 drinks/day   Drug use: Not Currently    Types: Heroin    Comment: sober since 2004     Allergies   Patient has no allergy information on record.   Review of Systems Review of Systems  Constitutional:  Positive for fatigue.  Respiratory:  Positive for shortness of breath.   Genitourinary:  Positive for dysuria.  All other systems reviewed and are negative.   Physical Exam Triage Vital Signs ED Triage Vitals  Enc Vitals Group     BP      Pulse      Resp      Temp      Temp src      SpO2      Weight      Height      Head Circumference      Peak Flow      Pain Score      Pain Loc      Pain Edu?      Excl. in GC?    No data found.  Updated Vital Signs BP (!) 122/92 (BP Location: Right Arm)   Pulse (!) 57   Temp (!) 97.4 F (36.3 C) (Oral)   Resp 20   Ht  5\' 8"  (1.727 m)   Wt 145 lb 8.1 oz (66 kg)   SpO2 94%   BMI 22.12 kg/m    Physical Exam Vitals and nursing note reviewed.  Constitutional:      General: He is not in acute distress.    Appearance: Normal appearance. He is normal weight. He is ill-appearing.  HENT:     Head: Normocephalic and atraumatic.     Mouth/Throat:     Mouth: Mucous membranes are moist.     Pharynx: Oropharynx is clear.  Eyes:     Extraocular Movements: Extraocular movements intact.     Conjunctiva/sclera: Conjunctivae normal.     Pupils: Pupils are equal, round, and reactive to light.  Cardiovascular:     Rate and Rhythm: Normal rate and regular rhythm.     Pulses: Normal pulses.     Heart sounds: Normal heart sounds.  Pulmonary:     Effort: Pulmonary effort is normal. No respiratory distress.     Breath sounds: No stridor. No wheezing or rales.     Comments: Mild scattered rhonchi, diminished breath sounds noted throughout Musculoskeletal:        General: Normal range of motion.     Cervical back: Normal range of motion and neck supple.  Skin:    General: Skin is warm and dry.  Neurological:     General: No focal deficit present.     Mental Status: He is alert and oriented to person, place, and time. Mental status is at baseline.  Psychiatric:        Mood and Affect: Mood normal.        Behavior: Behavior normal.     UC Treatments / Results  Labs (all labs ordered are listed, but only abnormal results are displayed) Labs Reviewed  POCT URINALYSIS DIP (MANUAL ENTRY) - Abnormal; Notable for the following components:      Result Value   Protein Ur, POC =100 (*)    All other components within normal limits  URINE CULTURE    EKG   Radiology No results found.  Procedures Procedures (including critical care time)  Medications Ordered in UC Medications - No data to display  Initial Impression / Assessment and Plan / UC Course  I have reviewed the triage vital signs and the nursing  notes.  Pertinent labs & imaging results that were available during my care of the patient were reviewed by me and considered in my medical decision making (see chart for details).    MDM: 1.  Dysuria-UA negative, urine culture ordered; 2. Fatigue-unspecified secondary to COVID-19 currently; 2.  Shortness of breath-history of COPD currently on 2 L of oxygen here in clinic; patient transported via EMS to [name].  Final Clinical Impressions(s) / UC Diagnoses   Final diagnoses:  Dysuria  Fatigue, unspecified type  Shortness of breath     Discharge Instructions      Advised patient/girlfriend we will follow-up with urine culture results once received.  Advised patient/girlfriend we have notified EMS as requested and will transport him to [facility].     ED Prescriptions   None    PDMP not reviewed this encounter.   Trevor Iha, FNP 05/09/21 1421

## 2021-05-09 NOTE — Telephone Encounter (Signed)
Transition Care Management Follow-up Telephone Call Date of discharge and from where: 09/02, 09/06 and 09/07 from [facility] How have you been since you were released from the hospital? Pt states that he is concerned about dementia. Pt is forgetful and things are not making sense to him. Pt is concerned about this. I asked if patient dtr is there with him. He stated she was and I spoke to her. I informed to see PCP for evaluation of UTI and referral for Neurology.  Any questions or concerns? No  Items Reviewed: Did the pt receive and understand the discharge instructions provided? Yes  Medications obtained and verified? Yes  Other? No  Any new allergies since your discharge? No  Dietary orders reviewed? No Do you have support at home? Yes   Functional Questionnaire: (I = Independent and D = Dependent) ADLs: I  Bathing/Dressing- I  Meal Prep- I  Eating- I  Maintaining continence- I  Transferring/Ambulation- I  Managing Meds- I   Follow up appointments reviewed:  PCP Hospital f/u appt confirmed? No   Specialist Hospital f/u appt confirmed? No   Are transportation arrangements needed? No  If their condition worsens, is the pt aware to call PCP or go to the Emergency Dept.? Yes Was the patient provided with contact information for the PCP's office or ED? Yes Was to pt encouraged to call back with questions or concerns? Yes  

## 2021-05-09 NOTE — ED Notes (Signed)
EMS called to transport patient to [facility].  Patient is being discharged from the Urgent Care and sent to the Emergency Department via ambulane . Per ems, patient is in need of higher level of care due to shortness of breath. Patient is aware and verbalizes understanding of plan of care.  Vitals:   05/09/21 1317  BP: (!) 122/92  Pulse: (!) 57  Resp: 20  Temp: (!) 97.4 F (36.3 C)  SpO2: 95%

## 2021-05-09 NOTE — Telephone Encounter (Signed)
Transition Care Management Follow-up Telephone Call Date of discharge and from where: 09/02, 09/06 and 09/07 from [facility] How have you been since you were released from the hospital? Pt states that he is concerned about dementia. Pt is forgetful and things are not making sense to him. Pt is concerned about this. I asked if patient dtr is there with him. He stated she was and I spoke to her. I informed to see PCP for evaluation of UTI and referral for Neurology.  Any questions or concerns? No  Items Reviewed: Did the pt receive and understand the discharge instructions provided? Yes  Medications obtained and verified? Yes  Other? No  Any new allergies since your discharge? No  Dietary orders reviewed? No Do you have support at home? Yes   Functional Questionnaire: (I = Independent and D = Dependent) ADLs: I  Bathing/Dressing- I  Meal Prep- I  Eating- I  Maintaining continence- I  Transferring/Ambulation- I  Managing Meds- I   Follow up appointments reviewed:  PCP Hospital f/u appt confirmed? No   Specialist Hospital f/u appt confirmed? No   Are transportation arrangements needed? No  If their condition worsens, is the pt aware to call PCP or go to the Emergency Dept.? Yes Was the patient provided with contact information for the PCP's office or ED? Yes Was to pt encouraged to call back with questions or concerns? Yes

## 2021-05-09 NOTE — Telephone Encounter (Signed)
Pt called stating that he has been having confusion, dizziness, numbness and tingling, and his tongue is drawn to one side when he sticks it out.  Pt states that he is currently in the ED waiting to be seen, but is requesting that we do something for him for this problem.  Advised pt that the ED is the place that needs to evaluate him for his symptoms and that they will make determinations for possible treatment(s).  Pt expressed understanding.  Tiajuana Amass, CMA

## 2021-05-09 NOTE — ED Notes (Signed)
Patient stable; assisted to exam table for comfort; O2 nasal canula at 2L. Girlfriend remaining with him.

## 2021-05-10 ENCOUNTER — Telehealth: Payer: Self-pay | Admitting: General Practice

## 2021-05-10 ENCOUNTER — Other Ambulatory Visit: Payer: Self-pay | Admitting: Medical-Surgical

## 2021-05-10 NOTE — Telephone Encounter (Signed)
Transition Care Management Follow-up Telephone Call Date of discharge and from where: 05/09/21 from [facility] How have you been since you were released from the hospital? Doing better. Any questions or concerns? No  Items Reviewed: Did the pt receive and understand the discharge instructions provided? Yes  Medications obtained and verified? No  Other? No  Any new allergies since your discharge? No  Dietary orders reviewed? Yes Do you have support at home? Yes   Home Care and Equipment/Supplies: Were home health services ordered? no  Functional Questionnaire: (I = Independent and D = Dependent) ADLs: I  Bathing/Dressing- I  Meal Prep- I  Eating- I  Maintaining continence- I  Transferring/Ambulation- I  Managing Meds- I  Follow up appointments reviewed:  PCP Hospital f/u appt confirmed? Yes  Scheduled to see Christen Butter, NP on 05/14/21 @ 1040. Specialist Hospital f/u appt confirmed? No   Are transportation arrangements needed? No  If their condition worsens, is the pt aware to call PCP or go to the Emergency Dept.? Yes Was the patient provided with contact information for the PCP's office or ED? Yes Was to pt encouraged to call back with questions or concerns? Yes

## 2021-05-12 DIAGNOSIS — U071 COVID-19: Secondary | ICD-10-CM | POA: Insufficient documentation

## 2021-05-12 DIAGNOSIS — R042 Hemoptysis: Secondary | ICD-10-CM | POA: Insufficient documentation

## 2021-05-12 DIAGNOSIS — I2699 Other pulmonary embolism without acute cor pulmonale: Secondary | ICD-10-CM | POA: Insufficient documentation

## 2021-05-12 LAB — URINE CULTURE
MICRO NUMBER:: 12350981
SPECIMEN QUALITY:: ADEQUATE

## 2021-05-13 ENCOUNTER — Telehealth: Payer: Self-pay | Admitting: Medical-Surgical

## 2021-05-13 ENCOUNTER — Telehealth (HOSPITAL_COMMUNITY): Payer: Self-pay | Admitting: Emergency Medicine

## 2021-05-13 NOTE — Telephone Encounter (Signed)
Pt was released today, will be here for his appointment tomorrow.

## 2021-05-13 NOTE — Telephone Encounter (Signed)
Patient appears to be admitted in the hospital [facility]). Please contact patient to verify and cancel his appointment if appropriate.   Thayer Ohm, DNP, APRN, FNP-BC [facility]

## 2021-05-14 ENCOUNTER — Ambulatory Visit (INDEPENDENT_AMBULATORY_CARE_PROVIDER_SITE_OTHER): Payer: Self-pay | Admitting: Medical-Surgical

## 2021-05-14 ENCOUNTER — Encounter: Payer: Self-pay | Admitting: Medical-Surgical

## 2021-05-14 VITALS — BP 115/82 | HR 105 | Resp 20 | Ht 68.0 in | Wt 147.0 lb

## 2021-05-14 DIAGNOSIS — I10 Essential (primary) hypertension: Secondary | ICD-10-CM

## 2021-05-14 DIAGNOSIS — I420 Dilated cardiomyopathy: Secondary | ICD-10-CM

## 2021-05-14 DIAGNOSIS — R0600 Dyspnea, unspecified: Secondary | ICD-10-CM

## 2021-05-14 DIAGNOSIS — I517 Cardiomegaly: Secondary | ICD-10-CM

## 2021-05-14 DIAGNOSIS — I444 Left anterior fascicular block: Secondary | ICD-10-CM

## 2021-05-14 DIAGNOSIS — I2699 Other pulmonary embolism without acute cor pulmonale: Secondary | ICD-10-CM

## 2021-05-14 DIAGNOSIS — I428 Other cardiomyopathies: Secondary | ICD-10-CM

## 2021-05-14 DIAGNOSIS — R0609 Other forms of dyspnea: Secondary | ICD-10-CM

## 2021-05-14 DIAGNOSIS — I5021 Acute systolic (congestive) heart failure: Secondary | ICD-10-CM

## 2021-05-14 DIAGNOSIS — Z09 Encounter for follow-up examination after completed treatment for conditions other than malignant neoplasm: Secondary | ICD-10-CM

## 2021-05-14 DIAGNOSIS — I4510 Unspecified right bundle-branch block: Secondary | ICD-10-CM

## 2021-05-14 MED ORDER — APIXABAN 5 MG PO TABS: 5.0000 mg | ORAL_TABLET | Freq: Two times a day (BID) | ORAL | 3 refills | Status: AC

## 2021-05-14 MED ORDER — LORAZEPAM 1 MG PO TABS: 1.0000 mg | ORAL_TABLET | Freq: Every day | ORAL | 0 refills | Status: DC | PRN

## 2021-05-14 MED ORDER — POTASSIUM CHLORIDE CRYS ER 20 MEQ PO TBCR: 20.0000 meq | EXTENDED_RELEASE_TABLET | Freq: Every day | ORAL | 0 refills | Status: DC

## 2021-05-14 MED ORDER — FUROSEMIDE 40 MG PO TABS: 40.0000 mg | ORAL_TABLET | Freq: Every day | ORAL | 1 refills | Status: DC

## 2021-05-14 NOTE — Progress Notes (Signed)
  HPI with pertinent ROS:   CC: Hospital follow-up  HPI: Pleasant 60 year old male presenting today for hospital follow-up after multiple hospital admissions and ED evaluations.  He has been seen for continued shortness of breath and mental status changes.  Recently tested positive for COVID and was placed on Paxlovid.  Unfortunately, developed a pulmonary embolism secondary to COVID-19 infection which has complicated his course.  He has recently been evaluated for dilated cardiomyopathy by several cardiologists although follow-up has been a challenge.  Today reports that his wife got him kicked out of the cardiologists offices as well as [facility] due to behavior.  He would like to get a new referral to cardiology today since he has now separated from her.  He would also like to have a referral to palliative/hospice care as he does not feel that he is going to be here for more than a few months.  He is taking his medications as prescribed and reports that the out-of-pocket cost for Eliquis is not a problem.  Would like to have refills on Lasix and Ativan.  He does still have several tablets of Ativan at home that were previously prescribed but notes that he has been having significant panic attacks with the anxiety of his split with his wife and his medical condition.  Denies SI/HI but does admit to giving away his possessions in preparation for what he feels is a short time left on earth.  I reviewed the past medical history, family history, social history, surgical history, and allergies today and no changes were needed.  Please see the problem list section below in epic for further details.   Physical exam:   General: Well Developed, well nourished, and in no acute distress.  Neuro: Alert and oriented x3.  HEENT: Normocephalic, atraumatic.  Skin: Warm and dry. Cardiac: Regular rate and rhythm, no murmurs rubs or gallops, no lower extremity edema.  Respiratory: Clear to auscultation  bilaterally. Not using accessory muscles, speaking in full sentences.  Impression and Recommendations:    1. Acute systolic congestive heart failure (HCC) 2. Nonischemic cardiomyopathy (HCC) 3. LAFB (left anterior fascicular block) 4. Right bundle branch block 5. Hypertension goal BP (blood pressure) < 130/80 6. Dilated cardiomyopathy (HCC) 7. Cardiomegaly 8. Dyspnea on exertion Referring to cardiology and palliative care as requested.  Continue current medications.  Refilling Lasix.  Discontinuing torsemide. - Ambulatory referral to Cardiology - Amb Referral to Palliative Care  9. Acute pulmonary embolism without acute cor pulmonale, unspecified pulmonary embolism type (HCC) Continue Eliquis 5 mg twice daily, refills provided.  10.  Hypokalemia Start potassium chloride 20 mEq daily for 7 days.  11.  Anxiety Lorazepam 1 mg daily as needed for anxiety.  Advised to use this very sparingly and only for severe panic.  12.  Hospital discharge follow-up Reviewed available information under care everywhere for hospitalizations and ED visits over the past 1-2 months.  Return in about 4 weeks (around 06/11/2021) for chronic disease follow up. ___________________________________________ Thayer Ohm, DNP, APRN, FNP-BC Primary Care and Sports Medicine [facility]

## 2021-05-15 MED ORDER — NITROFURANTOIN MONOHYD MACRO 100 MG PO CAPS: 100.0000 mg | ORAL_CAPSULE | Freq: Two times a day (BID) | ORAL | 0 refills | Status: DC

## 2021-05-15 NOTE — Telephone Encounter (Signed)
Per Dr. Leonides Grills, treat with Macrobid, per protocol for positive urine culture.   Attempted to reach patient x 1, unable to LVM

## 2021-05-16 ENCOUNTER — Encounter: Payer: Self-pay | Admitting: Medical-Surgical

## 2021-05-17 ENCOUNTER — Ambulatory Visit (INDEPENDENT_AMBULATORY_CARE_PROVIDER_SITE_OTHER): Payer: Self-pay | Admitting: Sports Medicine

## 2021-05-17 ENCOUNTER — Other Ambulatory Visit: Payer: Self-pay

## 2021-05-17 ENCOUNTER — Ambulatory Visit (INDEPENDENT_AMBULATORY_CARE_PROVIDER_SITE_OTHER): Payer: Self-pay

## 2021-05-17 DIAGNOSIS — G5603 Carpal tunnel syndrome, bilateral upper limbs: Secondary | ICD-10-CM

## 2021-05-17 NOTE — Progress Notes (Signed)
    Procedures performed today:    Procedure: Real-time Ultrasound Guided hydrodissection of the right median nerve at the carpal tunnel Device: Samsung HS60 Verbal informed consent obtained.  Time-out conducted.  Noted no overlying erythema, induration, or other signs of local infection.  Skin prepped in a sterile fashion.  Local anesthesia: Topical Ethyl chloride.  With sterile technique and under real time ultrasound guidance: Noted large median nerve.  Using a 25-gauge needle advanced into the carpal tunnel, taking care to avoid intraneural injection I injected medication both superficial to and deep to the median nerve freeing it from surrounding structures, I then redirected the needle deep and injected further medication around the flexor tendons deep within the carpal tunnel for a total of 1 cc kenalog 40, 5 cc 1% lidocaine without epinephrine. Completed without difficulty  Advised to call if fevers/chills, erythema, induration, drainage, or persistent bleeding.  Images permanently stored and available for review in PACS.  Impression: Technically successful ultrasound guided median nerve hydrodissection.  Procedure: Real-time Ultrasound Guided hydrodissection of the left median nerve at the carpal tunnel Device: Samsung HS60 Verbal informed consent obtained.  Time-out conducted.  Noted no overlying erythema, induration, or other signs of local infection.  Skin prepped in a sterile fashion.  Local anesthesia: Topical Ethyl chloride.  With sterile technique and under real time ultrasound guidance: Noted large median nerve. Using a 25-gauge needle advanced into the carpal tunnel, taking care to avoid intraneural injection I injected medication both superficial to and deep to the median nerve freeing it from surrounding structures, I then redirected the needle deep and injected further medication around the flexor tendons deep within the carpal tunnel for a total of 1 cc kenalog 40, 5 cc  1% lidocaine without epinephrine. Completed without difficulty  Advised to call if fevers/chills, erythema, induration, drainage, or persistent bleeding.  Images permanently stored and available for review in PACS.  Impression: Technically successful ultrasound guided median nerve hydrodissection.  Independent interpretation of notes and tests performed by another provider:   None.  Brief History, Exam, Impression, and Recommendations:    Bilateral carpal tunnel syndrome Melvin Rich returns, he is a pleasant 60 year old male with known bilateral carpal tunnel syndrome, last median nerve Hydro dissection was on the right side in June. Today we did both sides, return as needed.    ___________________________________________ [name]. Benjamin Stain, M.D., ABFM., CAQSM. Primary Care and Sports Medicine [facility]  Adjunct Instructor of Family Medicine  [employer]

## 2021-05-17 NOTE — Assessment & Plan Note (Signed)
Melvin Rich returns, he is a pleasant 60 year old male with known bilateral carpal tunnel syndrome, last median nerve Hydro dissection was on the right side in June. Today we did both sides, return as needed.

## 2021-05-17 NOTE — Telephone Encounter (Signed)
Attempted to reach patient x 2 

## 2021-05-20 NOTE — Telephone Encounter (Signed)
Reached patient, reviewed.  He states he is doing well and does not have a concern at this time

## 2021-05-26 IMAGING — RF DG CERVICAL SPINE - 1 VIEW
1 series · 2 of 2 positions shown · non-contrast
Comparison: Cervical spine MRI-11/28/2018

CLINICAL DATA: ACDF C5-C7

EXAM:
DG C-ARM 61-120 MIN; DG CERVICAL SPINE - 1 VIEW
FLUOROSCOPY TIME:  9 seconds

[Series 1: run · 2 of 2 slices shown]
[im 1/2]
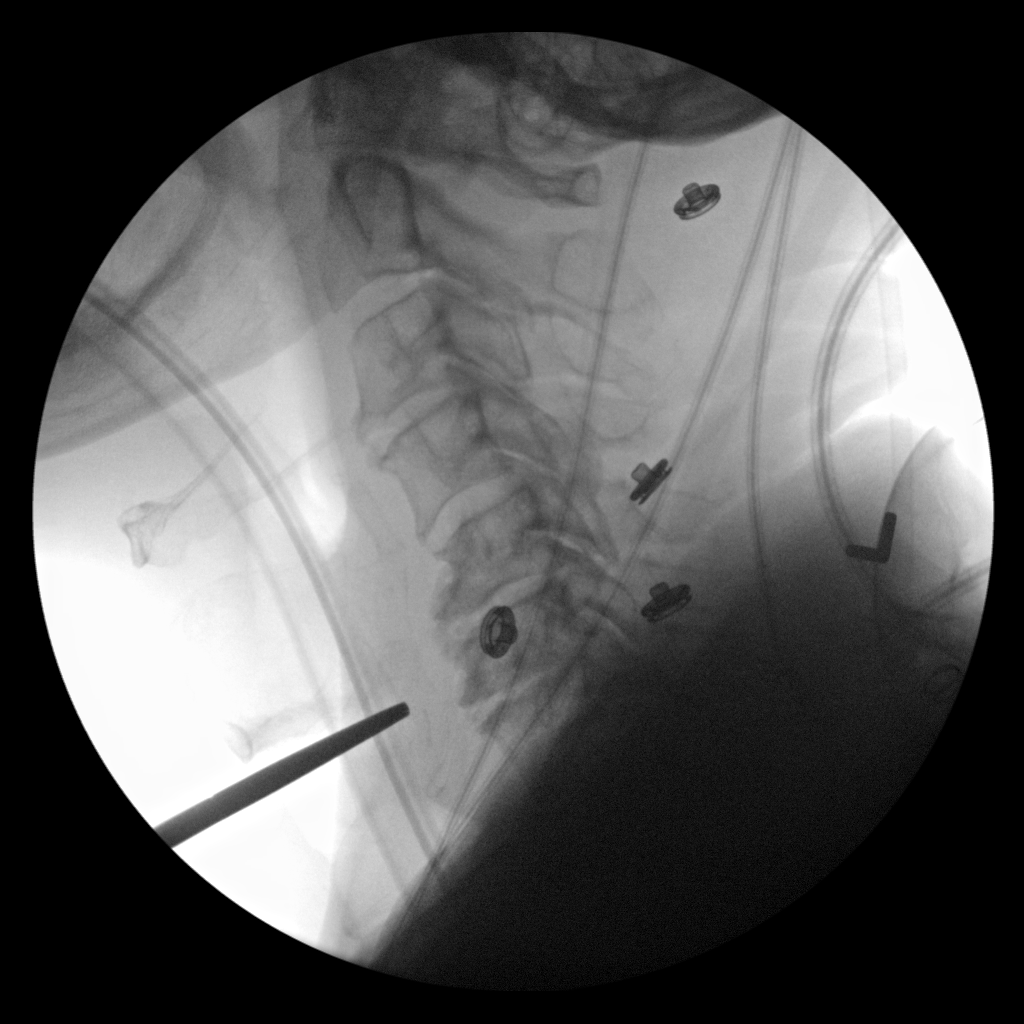
[im 2/2]
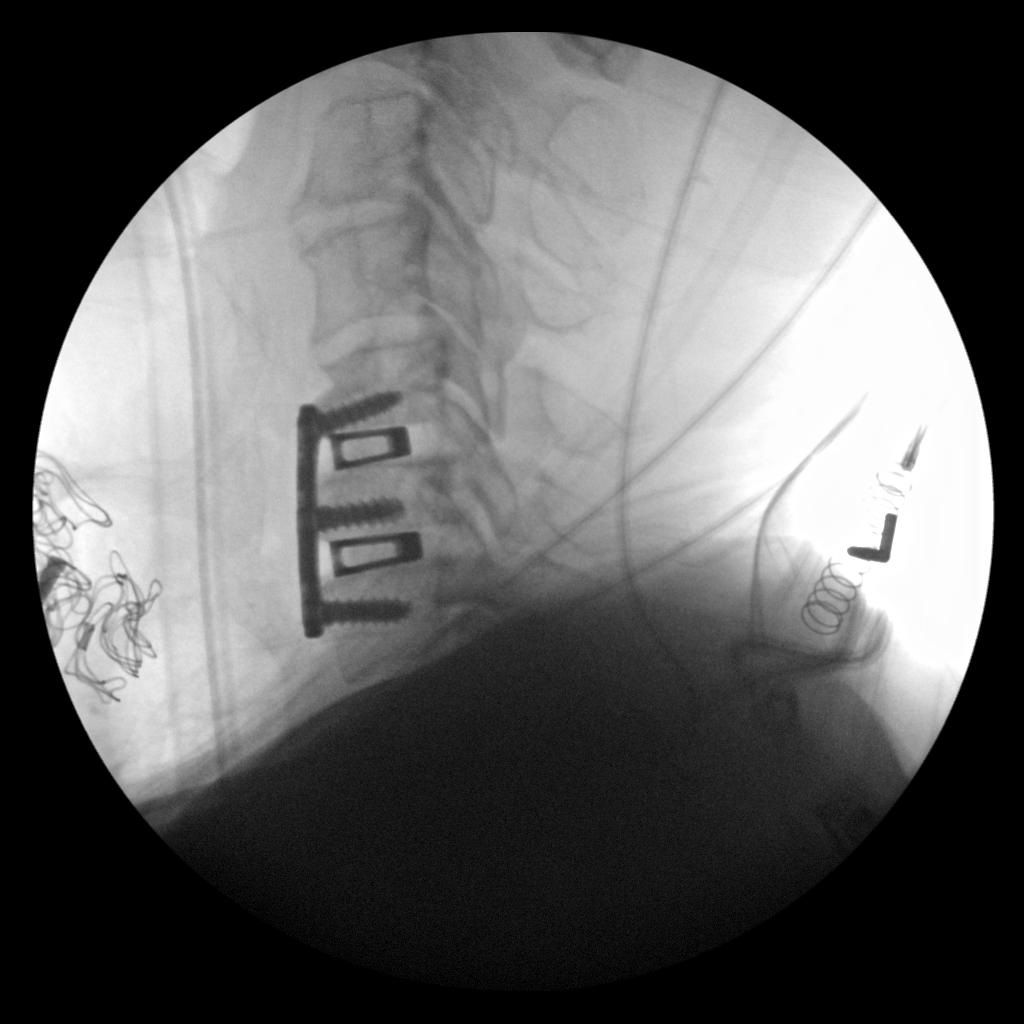

[2 of 2 positions shown; findings below may reference images not displayed]

FINDINGS: 2 spot lateral projection intraoperative fluoroscopic images of the
cervical spine are provided for review.

Image #1 demonstrates a radiopaque marking instrument overlying the
soft tissues anterior to the C6 vertebral body. An endotracheal tube
tip terminates below the field of view.

Image #2 demonstrates the sequela of C5-C7 ACDF and intervertebral
disc space replacement with restoration of the C5-C6 and C6-C7
intervertebral disc space heights. Expected adjacent prevertebral
soft tissue swelling. Radiopaque surgical support apparatus is noted
anterior to the operative site. No definite radiopaque foreign body.
IMPRESSION: Post C5-C7 ACDF and intervertebral disc space replacement without
evidence of complication.

## 2021-05-27 ENCOUNTER — Telehealth: Payer: Self-pay

## 2021-05-27 NOTE — Telephone Encounter (Signed)
[name] from [facility] call and stated that Melvin Rich declined palliative care.

## 2021-05-28 ENCOUNTER — Encounter: Payer: Self-pay | Admitting: Medical-Surgical

## 2021-05-30 ENCOUNTER — Other Ambulatory Visit: Payer: Self-pay | Admitting: Physician Assistant

## 2021-06-18 ENCOUNTER — Other Ambulatory Visit: Payer: Self-pay

## 2021-06-18 DIAGNOSIS — F1910 Other psychoactive substance abuse, uncomplicated: Secondary | ICD-10-CM | POA: Insufficient documentation

## 2021-06-18 DIAGNOSIS — M79602 Pain in left arm: Secondary | ICD-10-CM | POA: Insufficient documentation

## 2021-06-18 DIAGNOSIS — J302 Other seasonal allergic rhinitis: Secondary | ICD-10-CM | POA: Insufficient documentation

## 2021-06-21 ENCOUNTER — Ambulatory Visit: Payer: Medicaid Other | Admitting: Cardiology

## 2021-07-08 ENCOUNTER — Ambulatory Visit (INDEPENDENT_AMBULATORY_CARE_PROVIDER_SITE_OTHER): Payer: Self-pay

## 2021-07-08 ENCOUNTER — Other Ambulatory Visit: Payer: Self-pay

## 2021-07-08 ENCOUNTER — Ambulatory Visit (INDEPENDENT_AMBULATORY_CARE_PROVIDER_SITE_OTHER): Payer: Self-pay | Admitting: Medical-Surgical

## 2021-07-08 ENCOUNTER — Encounter: Payer: Self-pay | Admitting: Medical-Surgical

## 2021-07-08 VITALS — BP 132/81 | HR 74 | Temp 97.6°F | Ht 68.0 in | Wt 148.0 lb

## 2021-07-08 DIAGNOSIS — M25522 Pain in left elbow: Secondary | ICD-10-CM

## 2021-07-08 MED ORDER — HYDROCODONE-ACETAMINOPHEN 5-325 MG PO TABS: 1.0000 | ORAL_TABLET | Freq: Three times a day (TID) | ORAL | 0 refills | Status: AC | PRN

## 2021-07-08 MED ORDER — PREDNISONE 50 MG PO TABS: 50.0000 mg | ORAL_TABLET | Freq: Every day | ORAL | 0 refills | Status: DC

## 2021-07-08 NOTE — Progress Notes (Signed)
  HPI with pertinent ROS:   CC: Left elbow pain  HPI: Pleasant 60 year old male accompanied by his significant other presenting today for abrupt onset of left elbow pain.  Started on Friday with his wrist being swollen, painful, and difficult to move.  The pain is since migrated up to his left elbow and is described as excruciating.  At rest, he rates the pain 8/10 however with movement, he rates it 12/10.  Admits to taking 2 Lortabs prior to his arrival which has taken the edge off.  Notes that over the weekend, a large generator fell over to the side and he reached to pick it up and set a back upright.  This occurred with the pain following shortly after.  He had a pacemaker placed on the left side on 10/20.  I reviewed the past medical history, family history, social history, surgical history, and allergies today and no changes were needed.  Please see the problem list section below in epic for further details.   Physical exam:   General: Well Developed, well nourished, and in no acute distress.  Neuro: Alert and oriented x3.  HEENT: Normocephalic, atraumatic.  Skin: Warm and dry. Cardiac: Regular rate and rhythm, no murmurs rubs or gallops, no lower extremity edema.  Respiratory: Clear to auscultation bilaterally. Not using accessory muscles, speaking in full sentences.  Impression and Recommendations:    1. Left elbow pain Getting left elbow x-rays today.  Consider possible olecranon bursitis versus tenosynovitis.  Prednisone 50 mg daily x5 days.  Sending in a small supply of Lortab for pain as he is on Eliquis and needs to avoid NSAIDs. - DG Elbow Complete Left; Future  Return in about 4 weeks (around 08/05/2021) for left elbow pain follow up. ___________________________________________ Thayer Ohm, DNP, APRN, FNP-BC Primary Care and Sports Medicine [facility]

## 2021-07-22 ENCOUNTER — Telehealth: Payer: Self-pay | Admitting: General Practice

## 2021-07-22 NOTE — Telephone Encounter (Signed)
Transition Care Management Unsuccessful Follow-up Telephone Call  Date of discharge and from where:  07/22/21 from [facility]  Attempts:  1st Attempt  Reason for unsuccessful TCM follow-up call:  Left voice message

## 2021-07-24 NOTE — Telephone Encounter (Signed)
Transition Care Management Unsuccessful Follow-up Telephone Call  Date of discharge and from where:  07/22/21 from [facility]  Attempts:  2nd Attempt  Reason for unsuccessful TCM follow-up call:  Left voice message

## 2021-07-29 NOTE — Telephone Encounter (Signed)
Transition Care Management Unsuccessful Follow-up Telephone Call  Date of discharge and from where:  07/22/21 from [facility]  Attempts:  3rd Attempt  Reason for unsuccessful TCM follow-up call:  Left voice message

## 2021-08-05 ENCOUNTER — Ambulatory Visit: Payer: Self-pay | Admitting: Sports Medicine

## 2021-08-14 ENCOUNTER — Ambulatory Visit: Payer: Medicaid Other | Admitting: Sports Medicine

## 2021-08-16 ENCOUNTER — Telehealth: Payer: Self-pay | Admitting: General Practice

## 2021-08-16 NOTE — Telephone Encounter (Signed)
Transition Care Management Follow-up Telephone Call Date of discharge and from where: 08/15/21 from [facility] How have you been since you were released from the hospital? Feeling much better. He has an appt scheduled to discuss a referral to a different cardiologist. His pacemaker was removed due to an infection. He has not scheduled a follow up with the cardiologist.  Any questions or concerns? No  Items Reviewed: Did the pt receive and understand the discharge instructions provided? Yes  Medications obtained and verified? Yes  Other? No  Any new allergies since your discharge? No  Dietary orders reviewed? Yes Do you have support at home? Yes   Home Care and Equipment/Supplies: Were home health services ordered? no  Functional Questionnaire: (I = Independent and D = Dependent) ADLs: I  Bathing/Dressing- I  Meal Prep- I  Eating- I  Maintaining continence- I  Transferring/Ambulation- I  Managing Meds- I  Follow up appointments reviewed:  PCP Hospital f/u appt confirmed? Yes  Scheduled to see Christen Butter, NP on 08/21/21 @ 1030. Specialist Hospital f/u appt confirmed? No   Are transportation arrangements needed? No  If their condition worsens, is the pt aware to call PCP or go to the Emergency Dept.? Yes Was the patient provided with contact information for the PCP's office or ED? Yes Was to pt encouraged to call back with questions or concerns? Yes

## 2021-08-21 ENCOUNTER — Other Ambulatory Visit: Payer: Self-pay

## 2021-08-21 ENCOUNTER — Ambulatory Visit (INDEPENDENT_AMBULATORY_CARE_PROVIDER_SITE_OTHER): Payer: Self-pay | Admitting: Medical-Surgical

## 2021-08-21 ENCOUNTER — Encounter: Payer: Self-pay | Admitting: Medical-Surgical

## 2021-08-21 VITALS — BP 119/75 | HR 87 | Resp 20 | Ht 68.0 in | Wt 158.7 lb

## 2021-08-21 DIAGNOSIS — I428 Other cardiomyopathies: Secondary | ICD-10-CM

## 2021-08-21 DIAGNOSIS — I10 Essential (primary) hypertension: Secondary | ICD-10-CM

## 2021-08-21 DIAGNOSIS — I5021 Acute systolic (congestive) heart failure: Secondary | ICD-10-CM

## 2021-08-21 DIAGNOSIS — I444 Left anterior fascicular block: Secondary | ICD-10-CM

## 2021-08-21 DIAGNOSIS — I420 Dilated cardiomyopathy: Secondary | ICD-10-CM

## 2021-08-21 DIAGNOSIS — Z09 Encounter for follow-up examination after completed treatment for conditions other than malignant neoplasm: Secondary | ICD-10-CM

## 2021-08-21 DIAGNOSIS — I4510 Unspecified right bundle-branch block: Secondary | ICD-10-CM

## 2021-08-21 NOTE — Progress Notes (Signed)
°  HPI with pertinent ROS:   CC: hospital follow up, cardiology referral  HPI: Pleasant 60 year old male accompanied by his significant other presenting today for hospital follow-up and to discuss a cardiology referral.  He had pacemaker placed in October but unfortunately had a hospitalization recently to have it removed due to a MRSA infection.  It reports that he is unhappy with his current cardiologist because he was seen several times by them and they denied anything wrong.  It was not until his abscess popped while he was on vacation in [place] that action was taken.  Today he would like to have a second opinion from a different cardiologist and is requesting a referral.  Denies chest pain, worsening shortness of breath, lower extremity edema, dizziness, headaches, or vision changes.  He is currently being treated with Zyvox, tolerating it well but has quite a few questions regarding the plan for a new pacemaker and if this is necessary or not.  I reviewed the past medical history, family history, social history, surgical history, and allergies today and no changes were needed.  Please see the problem list section below in epic for further details.   Physical exam:   General: Well Developed, well nourished, and in no acute distress.  Neuro: Alert and oriented x3.  HEENT: Normocephalic, atraumatic.  Skin: Warm and dry. Cardiac: Regular rate and rhythm, no murmurs rubs or gallops, no lower extremity edema.  Respiratory: Clear to auscultation bilaterally. Not using accessory muscles, speaking in full sentences.  Impression and Recommendations:    1. Dilated cardiomyopathy (HCC) 2. LAFB (left anterior fascicular block) 3. Hypertension goal BP (blood pressure) < 130/80 4. Acute systolic congestive heart failure (HCC) 5. RBBB 6. Nonischemic cardiomyopathy (HCC) 7. Hospital discharge follow-up Reviewed hospital records.  Recommend continuing Zyvox as ordered.  Discussed MRSA infection  and what that means for his future care.  Referring to cardiology for second opinion and clarification of recommendations for managing his cardiomyopathy.  No refills needed today and patient has no specific needs at this time. - Ambulatory referral to Cardiology  Return if symptoms worsen or fail to improve. ___________________________________________ Thayer Ohm, DNP, APRN, FNP-BC Primary Care and Sports Medicine [facility] [place]

## 2021-08-22 ENCOUNTER — Other Ambulatory Visit: Payer: Self-pay

## 2021-08-22 NOTE — Progress Notes (Signed)
Pt called and asked for prednisone for his elbow pain. Prescription pended.

## 2021-08-23 ENCOUNTER — Telehealth: Payer: Self-pay | Admitting: General Practice

## 2021-08-23 NOTE — Progress Notes (Signed)
Pt informed.  Pt expressed understanding and is agreeable.  T. Olga Bourbeau, CMA  

## 2021-08-23 NOTE — Progress Notes (Signed)
Prednisone in the setting of an active MRSA infection in his open wound is not really recommended due to the systemic effects. It may hinder wound healing and infection resolution.   ___________________________________________ Thayer Ohm, DNP, APRN, FNP-BC Primary Care and Sports Medicine [facility] [place]

## 2021-08-23 NOTE — Telephone Encounter (Signed)
Transition Care Management Unsuccessful Follow-up Telephone Call  Date of discharge and from where:  08/23/21 from [facility]  Attempts:  1st Attempt  Reason for unsuccessful TCM follow-up call:  No answer/busy

## 2021-08-27 ENCOUNTER — Encounter: Payer: Self-pay | Admitting: Medical-Surgical

## 2021-08-27 DIAGNOSIS — S21102D Unspecified open wound of left front wall of thorax without penetration into thoracic cavity, subsequent encounter: Secondary | ICD-10-CM

## 2021-08-27 DIAGNOSIS — T827XXD Infection and inflammatory reaction due to other cardiac and vascular devices, implants and grafts, subsequent encounter: Secondary | ICD-10-CM

## 2021-08-27 DIAGNOSIS — A4902 Methicillin resistant Staphylococcus aureus infection, unspecified site: Secondary | ICD-10-CM

## 2021-08-27 DIAGNOSIS — F430 Acute stress reaction: Secondary | ICD-10-CM

## 2021-08-28 NOTE — Telephone Encounter (Signed)
Transition Care Management Unsuccessful Follow-up Telephone Call  Date of discharge and from where:  08/23/21 from [facility]  Attempts:  2nd Attempt  Reason for unsuccessful TCM follow-up call:  No answer/busy

## 2021-08-30 NOTE — Addendum Note (Signed)
Addended byChristen Butter on: 08/30/2021 01:09 PM   Modules accepted: Orders

## 2021-08-30 NOTE — Telephone Encounter (Signed)
Transition Care Management Unsuccessful Follow-up Telephone Call  Date of discharge and from where:  08/23/21 from [facility]  Attempts:  3rd Attempt  Reason for unsuccessful TCM follow-up call:  No answer/busy

## 2021-09-02 NOTE — Progress Notes (Signed)
Referring-Melvin Charna Archer, NP Reason for referral-cardiomyopathy  HPI: 61 year old male for evaluation of cardiomyopathy at request of Samuel Bouche, NP.  Previously followed by Dr Harriet Masson and at [facility].  Cardiac catheterization April 2021 showed normal coronary arteries and pulmonary capillary wedge pressure 20 to 24 mmHg.  Last echocardiogram September 2022 showed ejection fraction less than 20%,   severe left ventricular enlargement, moderate diastolic dysfunction, moderate left atrial enlargement, moderate right atrial enlargement, mild mitral regurgitation, moderate tricuspid regurgitation.  Hospitalizations with inotropes previously.  He has been felt not to be a candidate for advanced therapies at [facility] as his cardiomyopathy is felt secondary to alcohol and substance abuse. Also on apixaban for history of pulmonary embolus.  Patient had ICD implanted October [id number] which was complicated by infection.  The device was therefore explanted.  Cultures positive for staph aureus but blood cultures were negative.  It was recommended that he be treated with linezolid for 14 days.  He subsequently developed abscess formation by his report.  He underwent I&D by surgery yesterday.  He remains on antibiotics.  He denies fevers or chills.  He denies dyspnea on exertion, orthopnea, PND, pedal edema, chest pain or syncope.  Current Outpatient Medications  Medication Sig Dispense Refill   apixaban (ELIQUIS) 5 MG TABS tablet Take 1 tablet (5 mg total) by mouth 2 (two) times daily. 60 tablet 3   digoxin (LANOXIN) 0.125 MG tablet Take 1 tablet by mouth daily.     furosemide (LASIX) 40 MG tablet Take 1 tablet (40 mg total) by mouth daily. 90 tablet 1   JARDIANCE 10 MG TABS tablet Take 10 mg by mouth daily.     metoprolol succinate (TOPROL XL) 25 MG 24 hr tablet Take 0.5 tablets (12.5 mg total) by mouth daily. 45 tablet 1   Omega-3 Fatty Acids (FISH OIL PO) Take 6,000 mg by mouth daily.      sacubitril-valsartan  (ENTRESTO) 24-26 MG Take 1 tablet by mouth 2 (two) times daily.     spironolactone (ALDACTONE) 25 MG tablet Take 0.5 tablets (12.5 mg total) by mouth daily. 45 tablet 1   No current facility-administered medications for this visit.    No Known Allergies   Past Medical History:  Diagnosis Date   Alcohol use disorder    BPPV (benign paroxysmal positional vertigo), right 07/27/2018   Cardiomegaly 11/16/2019   Chronic active hepatitis ([place]) 04/23/2017   Pt stated that he had the treatment   Chronic hepatitis C virus infection ([place]) 04/23/2017   COVID-19    DDD (degenerative disc disease), cervical 04/18/2017   Post C5-C7 ACDF with Dr. 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   Substance abuse ([place])    Transaminitis 04/23/2017   Trigger thumb, left thumb 03/22/2019    Past Surgical History:  Procedure Laterality Date   ANTERIOR CERVICAL DECOMP/DISCECTOMY FUSION N/A  02/17/2019   Procedure: ANTERIOR CERVICAL DECOMPRESSION FUSION, CERVICAL 5-6, CERVICAL 6-7 WITH INSTRUMENTATION AND ALLOGRAFT;  Surgeon: Phylliss Bob, MD;  Location: [facility];  Service: Orthopedics;  Laterality: N/A;   HIP SURGERY  2005   RIGHT/LEFT HEART CATH AND CORONARY ANGIOGRAPHY N/A 12/08/2019   Procedure: RIGHT/LEFT HEART CATH AND CORONARY ANGIOGRAPHY;  Surgeon: Leonie Man, MD;  Location: [facility];  Service: Cardiovascular;   Laterality: N/A;   SHOULDER SURGERY  1985    Social History   Socioeconomic History   Marital status: Married    Spouse name: Not on file   Number of children: Not on file   Years of education: Not on file   Highest education level: Not on file  Occupational History   Not on file  Tobacco Use   Smoking status: Every Day    Packs/day: 0.50    Years: 47.00    Pack years: 23.50    Types: Cigarettes   Smokeless tobacco: Never  Vaping Use   Vaping Use: Never used  Substance and Sexual Activity   Alcohol use: Yes    Alcohol/week: 42.0 - 56.0 standard drinks    Types: 42 - 56 Standard drinks or equivalent per week    Comment: 6-8 drinks/day   Drug use: Not Currently    Types: Heroin    Comment: sober since 2004   Sexual activity: Not Currently    Birth control/protection: None  Other Topics Concern   Not on file  Social History Narrative   Not on file   Social Determinants of Health   Financial Resource Strain: Not on file  Food Insecurity: Not on file  Transportation Needs: Not on file  Physical Activity: Not on file  Stress: Not on file  Social Connections: Not on file  Intimate Partner Violence: Not on file    Family History  Adopted: Yes  Family history unknown: Yes    ROS: no fevers or chills, productive cough, hemoptysis, dysphasia, odynophagia, melena, hematochezia, dysuria, hematuria, rash, seizure activity, orthopnea, PND, pedal edema, claudication. Remaining systems are negative.  Physical Exam:   There were no vitals taken for this visit.  General:  Well developed/well nourished in NAD Skin warm/dry Patient not depressed No peripheral clubbing Back-normal HEENT-normal/normal eyelids Neck supple/normal carotid upstroke bilaterally; no bruits; no JVD; no thyromegaly chest - CTA/ normal expansion CV - RRR/normal S1 and S2; no murmurs, rubs or gallops;  PMI nondisplaced Abdomen -NT/ND, no HSM, no mass, + bowel sounds, no bruit 2+ femoral pulses,  no bruits Ext-no edema, chords, 2+ DP Neuro-grossly nonfocal  ECG -normal sinus rhythm at a rate of 60, left ventricular hypertrophy, left axis deviation, anterior T wave inversion.  Personally reviewed  A/P  1 nonischemic cardiomyopathy-plan to continue Entresto (increased to 49/51 twice daily), Toprol.  Check potassium and renal function in 1 week.  Cardiomyopathy is felt secondary to history of alcohol abuse.  Note he was previously evaluated at [facility] for advanced therapies and felt not to be a candidate given history of alcohol/substance abuse.  2 chronic systolic congestive heart failure-he is euvolemic on examination.  Continue Lasix, spironolactone and Jardiance.  3 history of ICD insertion for primary prevention-procedure subsequently complicated by device infection and was explanted.  He is scheduled to see the surgeon back in 1 week at [facility] following recent I&D.  He also apparently has been referred to infectious disease at [facility].  Continue antibiotics at their direction.  Once infection is completely treated we will  consider referral to electrophysiology for reinsertion of ICD on the right given severe LV dysfunction.  4 history of pulmonary embolus-continue apixaban.  5 hypertension-blood pressure controlled.  Continue present medications.  6 history of alcohol abuse  Kirk Ruths, MD

## 2021-09-03 ENCOUNTER — Telehealth: Payer: Self-pay

## 2021-09-03 ENCOUNTER — Encounter (INDEPENDENT_AMBULATORY_CARE_PROVIDER_SITE_OTHER): Payer: Self-pay

## 2021-09-03 NOTE — Telephone Encounter (Signed)
Transition Care Management Unsuccessful Follow-up Telephone Call  Date of discharge and from where:  08/31/2021 from [facility]  Attempts:  1st Attempt  Reason for unsuccessful TCM follow-up call:  Left voice message

## 2021-09-04 NOTE — Telephone Encounter (Signed)
Transition Care Management Follow-up Telephone Call Date of discharge and from where: 08/31/21 from [facility] How have you been since you were released from the hospital? Doing the same. He does have an appointment with the surgeon today.  Any questions or concerns? No  Items Reviewed: Did the pt receive and understand the discharge instructions provided? Yes  Medications obtained and verified? No  Other? No  Any new allergies since your discharge? No  Dietary orders reviewed? Yes Do you have support at home? Yes   Home Care and Equipment/Supplies: Were home health services ordered? no  Functional Questionnaire: (I = Independent and D = Dependent) ADLs: I  Bathing/Dressing- I  Meal Prep- I  Eating- I  Maintaining continence- I  Transferring/Ambulation- I  Managing Meds- I  Follow up appointments reviewed:  PCP Hospital f/u appt confirmed? No   Specialist Hospital f/u appt confirmed? Yes  Scheduled to see the general surgeon on 09/04/21 @ 1300. Are transportation arrangements needed? No  If their condition worsens, is the pt aware to call PCP or go to the Emergency Dept.? Yes Was the patient provided with contact information for the PCP's office or ED? Yes Was to pt encouraged to call back with questions or concerns? Yes

## 2021-09-07 ENCOUNTER — Other Ambulatory Visit: Payer: Self-pay | Admitting: Medical-Surgical

## 2021-09-07 MED ORDER — BUSPIRONE HCL 15 MG PO TABS: 15.0000 mg | ORAL_TABLET | Freq: Two times a day (BID) | ORAL | 0 refills | Status: DC

## 2021-09-07 MED ORDER — ESCITALOPRAM OXALATE 10 MG PO TABS: ORAL_TABLET | ORAL | 1 refills | Status: DC

## 2021-09-09 ENCOUNTER — Ambulatory Visit: Payer: Self-pay

## 2021-09-09 ENCOUNTER — Ambulatory Visit (INDEPENDENT_AMBULATORY_CARE_PROVIDER_SITE_OTHER): Payer: Self-pay | Admitting: Sports Medicine

## 2021-09-09 ENCOUNTER — Other Ambulatory Visit: Payer: Self-pay

## 2021-09-09 DIAGNOSIS — G5603 Carpal tunnel syndrome, bilateral upper limbs: Secondary | ICD-10-CM

## 2021-09-09 NOTE — Assessment & Plan Note (Signed)
Bilateral carpal tunnel syndrome, last hydrodissection was in mid September 2022, bilateral median nerve Hydro dissection today, return as needed.

## 2021-09-09 NOTE — Progress Notes (Signed)
° ° °  Procedures performed today:    Procedure: Real-time Ultrasound Guided hydrodissection of the left median nerve at the carpal tunnel Device: Samsung HS60 Verbal informed consent obtained.  Time-out conducted.  Noted no overlying erythema, induration, or other signs of local infection.  Skin prepped in a sterile fashion.  Local anesthesia: Topical Ethyl chloride.  With sterile technique and under real time ultrasound guidance: Noted enlarged median nerve, using a 25-gauge needle advanced into the carpal tunnel, taking care to avoid intraneural injection I injected medication both superficial to and deep to the median nerve freeing it from surrounding structures, I then redirected the needle deep and injected further medication around the flexor tendons deep within the carpal tunnel for a total of 1 cc kenalog 40, 5 cc 1% lidocaine without epinephrine. Completed without difficulty  Advised to call if fevers/chills, erythema, induration, drainage, or persistent bleeding.  Images permanently stored and available for review in PACS.  Impression: Technically successful ultrasound guided median nerve hydrodissection.  Procedure: Real-time Ultrasound Guided hydrodissection of the right median nerve at the carpal tunnel Device: Samsung HS60 Verbal informed consent obtained.  Time-out conducted.  Noted no overlying erythema, induration, or other signs of local infection.  Skin prepped in a sterile fashion.  Local anesthesia: Topical Ethyl chloride.  With sterile technique and under real time ultrasound guidance: Noted enlarged median nerve, using a 25-gauge needle advanced into the carpal tunnel, taking care to avoid intraneural injection I injected medication both superficial to and deep to the median nerve freeing it from surrounding structures, I then redirected the needle deep and injected further medication around the flexor tendons deep within the carpal tunnel for a total of 1 cc kenalog 40,  5 cc 1% lidocaine without epinephrine. Completed without difficulty  Advised to call if fevers/chills, erythema, induration, drainage, or persistent bleeding.  Images permanently stored and available for review in PACS.  Impression: Technically successful ultrasound guided median nerve hydrodissection.  Independent interpretation of notes and tests performed by another provider:   None.  Brief History, Exam, Impression, and Recommendations:    Bilateral carpal tunnel syndrome Bilateral carpal tunnel syndrome, last hydrodissection was in mid September 2022, bilateral median nerve Hydro dissection today, return as needed.    ___________________________________________ Gwen Her. Dianah Field, M.D., ABFM., CAQSM. Primary Care and [place] Instructor of [place] [facility] [employer]

## 2021-09-11 ENCOUNTER — Ambulatory Visit (INDEPENDENT_AMBULATORY_CARE_PROVIDER_SITE_OTHER): Payer: Self-pay | Admitting: Cardiology

## 2021-09-11 ENCOUNTER — Encounter: Payer: Self-pay | Admitting: Cardiology

## 2021-09-11 ENCOUNTER — Other Ambulatory Visit: Payer: Self-pay

## 2021-09-11 VITALS — BP 147/86 | HR 60 | Ht 69.0 in | Wt 161.8 lb

## 2021-09-11 DIAGNOSIS — I428 Other cardiomyopathies: Secondary | ICD-10-CM

## 2021-09-11 DIAGNOSIS — I2699 Other pulmonary embolism without acute cor pulmonale: Secondary | ICD-10-CM

## 2021-09-11 DIAGNOSIS — I5022 Chronic systolic (congestive) heart failure: Secondary | ICD-10-CM

## 2021-09-11 DIAGNOSIS — I10 Essential (primary) hypertension: Secondary | ICD-10-CM

## 2021-09-11 MED ORDER — SACUBITRIL-VALSARTAN 49-51 MG PO TABS: 1.0000 | ORAL_TABLET | Freq: Two times a day (BID) | ORAL | 11 refills | Status: DC

## 2021-09-11 NOTE — Patient Instructions (Signed)
Medication Instructions:   INCREASE ENTRESTO TO 49/51 MG TWICE DAILY- 2 OF THE 24/25 MG TABLETS TWICE DAILY  *If you need a refill on your cardiac medications before your next appointment, please call your pharmacy*   Lab Work:  Your physician recommends that you return for lab work in: ONE WEEK  If you have labs (blood work) drawn today and your tests are completely normal, you will receive your results only by: [employer] (if you have MyChart) OR A paper copy in the mail If you have any lab test that is abnormal or we need to change your treatment, we will call you to review the results.   Follow-Up: At [facility], you and your health needs are our priority.  As part of our continuing mission to provide you with exceptional heart care, we have created designated Provider Care Teams.  These Care Teams include your primary Cardiologist (physician) and Advanced Practice Providers (APPs -  Physician Assistants and Nurse Practitioners) who all work together to provide you with the care you need, when you need it.  We recommend signing up for the patient portal called "MyChart".  Sign up information is provided on this After Visit Summary.  MyChart is used to connect with patients for Virtual Visits (Telemedicine).  Patients are able to view lab/test results, encounter notes, upcoming appointments, etc.  Non-urgent messages can be sent to your provider as well.   To learn more about what you can do with MyChart, go to [email or web address].    Your next appointment:   8 week(s)  The format for your next appointment:   In Person  Provider:   Olga Millers MD

## 2021-10-04 ENCOUNTER — Encounter: Payer: Self-pay | Admitting: *Deleted

## 2021-10-16 LAB — BASIC METABOLIC PANEL
BUN: 15 mg/dL (ref 7–25)
CO2: 29 mmol/L (ref 20–32)
Calcium: 9.8 mg/dL (ref 8.6–10.3)
Chloride: 103 mmol/L (ref 98–110)
Creat: 0.75 mg/dL (ref 0.70–1.35)
Glucose, Bld: 82 mg/dL (ref 65–139)
Potassium: 5.6 mmol/L — ABNORMAL HIGH (ref 3.5–5.3)
Sodium: 138 mmol/L (ref 135–146)

## 2021-10-21 ENCOUNTER — Telehealth: Payer: Self-pay | Admitting: *Deleted

## 2021-10-21 DIAGNOSIS — E875 Hyperkalemia: Secondary | ICD-10-CM

## 2021-10-21 NOTE — Telephone Encounter (Signed)
Spoke with pt, Aware of dr Ludwig Clarks recommendations.  He will return Friday for repeat labs. Order placed

## 2021-10-21 NOTE — Telephone Encounter (Signed)
-----   Message from Lewayne Bunting, MD sent at 10/17/2021  8:29 AM EST ----- Low K diet; repeat bmet Monday Olga Millers

## 2021-10-29 NOTE — Progress Notes (Signed)
? ? ? ? ?HPI: FU CM. Previously followed by Dr Harriet Masson and at [facility].  Cardiac catheterization April 2021 showed normal coronary arteries and pulmonary capillary wedge pressure 20 to 24 mmHg.  Last echocardiogram September 2022 showed ejection fraction less than 20%,  severe left ventricular enlargement, moderate diastolic dysfunction, moderate left atrial enlargement, moderate right atrial enlargement, mild mitral regurgitation, moderate tricuspid regurgitation.  Hospitalizations with inotropes previously.  He has been felt not to be a candidate for advanced therapies at [facility] as his cardiomyopathy is felt secondary to alcohol and substance abuse. Also on apixaban for history of pulmonary embolus.  Patient had ICD implanted October [id number] which was complicated by infection.  The device was therefore explanted.  Cultures positive for staph aureus but blood cultures were negative.  It was recommended that he be treated with linezolid for 14 days.  He subsequently developed abscess formation by his report.  He underwent I&D by surgery.  Since last seen he denies increased dyspnea on exertion, orthopnea, PND, pedal edema, exertional chest pain or syncope. ? ?Current Outpatient Medications  ?Medication Sig Dispense Refill  ? apixaban (ELIQUIS) 5 MG TABS tablet Take 1 tablet (5 mg total) by mouth 2 (two) times daily. 60 tablet 3  ? busPIRone (BUSPAR) 15 MG tablet Take 1 tablet (15 mg total) by mouth 2 (two) times daily. 60 tablet 0  ? digoxin (LANOXIN) 0.125 MG tablet Take 1 tablet by mouth daily.    ? escitalopram (LEXAPRO) 10 MG tablet Take 1/2 tablet (5mg ) daily for 8 days then increase to 1 tablet (10mg ) daily. 30 tablet 1  ? furosemide (LASIX) 40 MG tablet Take 1 tablet (40 mg total) by mouth daily. 90 tablet 1  ? JARDIANCE 10 MG TABS tablet Take 10 mg by mouth daily.    ? metoprolol succinate (TOPROL XL) 25 MG 24 hr tablet Take 0.5 tablets (12.5 mg total) by mouth daily. 45 tablet 1  ? nitroGLYCERIN (NITROSTAT)  0.4 MG SL tablet SMARTSIG:1 Tablet(s) Sublingual PRN    ? Omega-3 Fatty Acids (FISH OIL PO) Take 6,000 mg by mouth daily.     ? sacubitril-valsartan (ENTRESTO) 49-51 MG Take 1 tablet by mouth 2 (two) times daily. 60 tablet 11  ? spironolactone (ALDACTONE) 25 MG tablet Take 0.5 tablets (12.5 mg total) by mouth daily. 45 tablet 1  ? ?No current facility-administered medications for this visit.  ? ? ? ?Past Medical History:  ?Diagnosis Date  ? Alcohol use disorder   ? BPPV (benign paroxysmal positional vertigo), right 07/27/2018  ? Cardiomegaly 11/16/2019  ? CHF (congestive heart failure) ([place])   ? Chronic active hepatitis ([place]) 04/23/2017  ? Pt stated that he had the treatment  ? Chronic hepatitis C virus infection ([place]) 04/23/2017  ? COVID-19   ? DDD (degenerative disc disease), cervical 04/18/2017  ? Post C5-C7 ACDF with Dr. Lynann Bologna June 2020  ? Degenerative disc disease, cervical   ? Degenerative disc disease, cervical   ? Eustachian tube dysfunction, bilateral 02/23/2018  ? Hearing loss of left ear 07/20/2018  ? 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 HOH (hard of hearing)   ? left ear only  ? Hypertension   ? Hypertension goal BP (blood pressure) < 130/80 04/18/2017  ? Left arm pain   ? 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 Poor dentition 05/05/2017  ? Radiculopathy 02/17/2019  ? Rheumatoid arthritis ([place])   ? Rheumatoid arthritis involving both elbows ([place]) 04/18/2017  ? Seasonal  allergies   ? 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 Substance abuse (HCC)   ? Transaminitis 04/23/2017  ? Trigger thumb, left thumb 03/22/2019  ? ? ?Past Surgical History:  ?Procedure Laterality Date  ? ANTERIOR CERVICAL DECOMP/DISCECTOMY FUSION N/A 02/17/2019  ? Procedure: ANTERIOR CERVICAL DECOMPRESSION FUSION, CERVICAL 5-6, CERVICAL 6-7 WITH INSTRUMENTATION AND ALLOGRAFT;  Surgeon: Estill Bamberg, MD;  Location: [facility] OR;  Service: Orthopedics;  Laterality: N/A;  ? HIP SURGERY  2005  ? RIGHT/LEFT HEART CATH AND CORONARY  ANGIOGRAPHY N/A 12/08/2019  ? Procedure: RIGHT/LEFT HEART CATH AND CORONARY ANGIOGRAPHY;  Surgeon: Marykay Lex, MD;  Location: [facility] INVASIVE CV LAB;  Service: Cardiovascular;  Laterality: N/A;  ? SHOULDER SURGERY  1985  ? ? ?Social History  ? ?Socioeconomic History  ? Marital status: Married  ?  Spouse name: Not on file  ? Number of children: Not on file  ? Years of education: Not on file  ? Highest education level: Not on file  ?Occupational History  ? Not on file  ?Tobacco Use  ? Smoking status: Every Day  ?  Packs/day: 0.50  ?  Years: 47.00  ?  Pack years: 23.50  ?  Types: Cigarettes  ? Smokeless tobacco: Never  ?Vaping Use  ? Vaping Use: Never used  ?Substance and Sexual Activity  ? Alcohol use: Yes  ?  Alcohol/week: 42.0 - 56.0 standard drinks  ?  Types: 42 - 56 Standard drinks or equivalent per week  ?  Comment: 6-8 drinks/day  ? Drug use: Not Currently  ?  Types: Heroin  ?  Comment: sober since 2004  ? Sexual activity: Not Currently  ?  Birth control/protection: None  ?Other Topics Concern  ? Not on file  ?Social History Narrative  ? Not on file  ? ?Social Determinants of Health  ? ?Financial Resource Strain: Not on file  ?Food Insecurity: Not on file  ?Transportation Needs: Not on file  ?Physical Activity: Not on file  ?Stress: Not on file  ?Social Connections: Not on file  ?Intimate Partner Violence: Not on file  ? ? ?Family History  ?Adopted: Yes  ?Family history unknown: Yes  ? ? ?ROS: no fevers or chills, productive cough, hemoptysis, dysphasia, odynophagia, melena, hematochezia, dysuria, hematuria, rash, seizure activity, orthopnea, PND, pedal edema, claudication. Remaining systems are negative. ? ?Physical Exam: ?Well-developed well-nourished in no acute distress.  ?Skin is warm and dry.  ?HEENT is normal.  ?Neck is supple.  ?Chest is clear to auscultation with normal expansion.  ?Cardiovascular exam is regular rate and rhythm.  ?Abdominal exam nontender or distended. No masses palpated. ?Extremities  show no edema. ?neuro grossly intact ? ?A/P ? ?1 nonischemic cardiomyopathy-plan to continue Entresto (increased to 49/51 twice daily) and Toprol.  Cardiomyopathy is felt secondary to history of alcohol abuse.  He was evaluated at [facility] and felt not to be a candidate for advanced therapies given history of alcohol/substance abuse.  This can be revisited in the future if his symptoms worsen.  Note I will plan to repeat echocardiogram in 3 months to see if LV function has improved with medical therapy. ? ?2 chronic systolic congestive heart failure-patient is euvolemic.  Continue present medications including Lasix, spironolactone and Jardiance.  Check potassium and renal function. ? ?3 history of ICD insertion for primary prevention-after insertion patient developed device infection and it was explanted.  He ultimately developed abscess which required I&D.  He is now off of antibiotics with no residual infection.  We discussed  ICD today if LV function remains decreased following medication titration.  I explained the risk of sudden death if his LV function remains decreased.  He will consider having ICD placed but not until after August as he has function that he wants to be present. ? ?4 hypertension-patient's blood pressure is elevated.  Increase Entresto as outlined above and follow. ? ?5 prior pulmonary embolus-continue apixaban. ? ?6 history of alcohol abuse-patient now denies. ? ?7 tobacco abuse-patient counseled on discontinuing. ? ?Kirk Ruths, MD ? ? ? ?

## 2021-10-30 ENCOUNTER — Encounter: Payer: Self-pay | Admitting: *Deleted

## 2021-11-11 ENCOUNTER — Ambulatory Visit (INDEPENDENT_AMBULATORY_CARE_PROVIDER_SITE_OTHER): Payer: Self-pay | Admitting: Cardiology

## 2021-11-11 ENCOUNTER — Encounter: Payer: Self-pay | Admitting: Cardiology

## 2021-11-11 ENCOUNTER — Other Ambulatory Visit: Payer: Self-pay

## 2021-11-11 VITALS — BP 152/98 | HR 64 | Ht 69.0 in | Wt 161.0 lb

## 2021-11-11 DIAGNOSIS — I5022 Chronic systolic (congestive) heart failure: Secondary | ICD-10-CM

## 2021-11-11 DIAGNOSIS — Z720 Tobacco use: Secondary | ICD-10-CM

## 2021-11-11 DIAGNOSIS — I428 Other cardiomyopathies: Secondary | ICD-10-CM

## 2021-11-11 DIAGNOSIS — I10 Essential (primary) hypertension: Secondary | ICD-10-CM

## 2021-11-11 MED ORDER — SACUBITRIL-VALSARTAN 49-51 MG PO TABS: 1.0000 | ORAL_TABLET | Freq: Two times a day (BID) | ORAL | 3 refills | Status: DC

## 2021-11-11 MED ORDER — DIGOXIN 125 MCG PO TABS: 125.0000 ug | ORAL_TABLET | Freq: Every day | ORAL | 3 refills | Status: AC

## 2021-11-11 MED ORDER — SPIRONOLACTONE 25 MG PO TABS: 12.5000 mg | ORAL_TABLET | Freq: Every day | ORAL | 3 refills | Status: AC

## 2021-11-11 MED ORDER — METOPROLOL SUCCINATE ER 25 MG PO TB24: 12.5000 mg | ORAL_TABLET | Freq: Every day | ORAL | 1 refills | Status: DC

## 2021-11-11 NOTE — Patient Instructions (Addendum)
Medication Instructions:  ? ?INCREASE ENTRESTO TO 49/51 MG TWICE DAILY ? ?*If you need a refill on your cardiac medications before your next appointment, please call your pharmacy ? ? ?Testing/Procedures: ? ?Your physician has requested that you have an echocardiogram. Echocardiography is a painless test that uses sound waves to create images of your heart. It provides your doctor with information about the size and shape of your heart and how well your heart?s chambers and valves are working. This procedure takes approximately one hour. There are no restrictions for this procedure. SCHEDULE IN 3 MONTHS-[place] [facility] ? ? ?Follow-Up: ?At [facility], you and your health needs are our priority.  As part of our continuing mission to provide you with exceptional heart care, we have created designated Provider Care Teams.  These Care Teams include your primary Cardiologist (physician) and Advanced Practice Providers (APPs -  Physician Assistants and Nurse Practitioners) who all work together to provide you with the care you need, when you need it. ? ?We recommend signing up for the patient portal called "MyChart".  Sign up information is provided on this After Visit Summary.  MyChart is used to connect with patients for Virtual Visits (Telemedicine).  Patients are able to view lab/test results, encounter notes, upcoming appointments, etc.  Non-urgent messages can be sent to your provider as well.   ?To learn more about what you can do with MyChart, go to [email or web address].   ? ?Your next appointment:   ?6 month(s) ? ?The format for your next appointment:   ?In Person ? ?Provider:   ?Kirk Ruths, MD  ? ? ? ?

## 2021-11-12 ENCOUNTER — Encounter: Payer: Self-pay | Admitting: Medical-Surgical

## 2021-11-12 LAB — BASIC METABOLIC PANEL
BUN: 13 mg/dL (ref 7–25)
CO2: 27 mmol/L (ref 20–32)
Calcium: 9.6 mg/dL (ref 8.6–10.3)
Chloride: 102 mmol/L (ref 98–110)
Creat: 0.81 mg/dL (ref 0.70–1.35)
Glucose, Bld: 95 mg/dL (ref 65–139)
Potassium: 5 mmol/L (ref 3.5–5.3)
Sodium: 137 mmol/L (ref 135–146)

## 2021-11-16 ENCOUNTER — Emergency Department (INDEPENDENT_AMBULATORY_CARE_PROVIDER_SITE_OTHER)
Admission: EM | Admit: 2021-11-16 | Discharge: 2021-11-16 | Disposition: A | Discharge status: Home / Self Care | Payer: Self-pay | Source: Home / Self Care | Attending: Family Medicine | Admitting: Family Medicine

## 2021-11-16 ENCOUNTER — Other Ambulatory Visit: Payer: Self-pay

## 2021-11-16 DIAGNOSIS — M5431 Sciatica, right side: Secondary | ICD-10-CM

## 2021-11-16 MED ORDER — TIZANIDINE HCL 4 MG PO TABS: 4.0000 mg | ORAL_TABLET | Freq: Four times a day (QID) | ORAL | 0 refills | Status: AC | PRN

## 2021-11-16 MED ORDER — METHYLPREDNISOLONE 4 MG PO TBPK: ORAL_TABLET | ORAL | 0 refills | Status: DC

## 2021-11-16 NOTE — ED Provider Notes (Signed)
?[facility] ? ? ? ?CSN: [id number] ?Arrival date & time: 11/16/21  1325 ? ? ?  ? ?History   ?Chief Complaint ?Chief Complaint  ?Patient presents with  ? Back Pain  ?  Lower back pain. X4 days  ? ? ?HPI ?Melvin Rich is a 61 y.o. male.  ? ?HPI ? ?Patient is here for low back pain.  In the right low back.  Radiates into the right buttock and leg.  He thinks it is "sciatica".  He has never had lumbar disc disease or sciatica previously.  No accident or injury.  No fall or overuse.  He does have known cervical degenerative disc disease and has had cervical surgery in the past.  He is here requesting "steroids" he reminds me that he is on a blood thinner and cannot take anti-inflammatory drugs.  He has not taken any medicines for the pain.  He has no numbness or weakness into the leg. ? ?Past Medical History:  ?Diagnosis Date  ? Alcohol use disorder   ? BPPV (benign paroxysmal positional vertigo), right 07/27/2018  ? Cardiomegaly 11/16/2019  ? CHF (congestive heart failure) (HCC)   ? Chronic active hepatitis (HCC) 04/23/2017  ? Pt stated that he had the treatment  ? Chronic hepatitis C virus infection (HCC) 04/23/2017  ? COVID-19   ? DDD (degenerative disc disease), cervical 04/18/2017  ? Post C5-C7 ACDF with Dr. Yevette Edwards June 2020  ? Degenerative disc disease, cervical   ? Degenerative disc disease, cervical   ? Eustachian tube dysfunction, bilateral 02/23/2018  ? Hearing loss of left ear 07/20/2018  ? 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 HOH (hard of hearing)   ? left ear only  ? Hypertension   ? Hypertension goal BP (blood pressure) < 130/80 04/18/2017  ? Left arm pain   ? 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 Poor dentition 05/05/2017  ? Radiculopathy 02/17/2019  ? Rheumatoid arthritis (HCC)   ? Rheumatoid arthritis involving both elbows (HCC) 04/18/2017  ? Seasonal allergies   ? 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 Substance abuse (HCC)   ? Transaminitis 04/23/2017  ? Trigger thumb,  left thumb 03/22/2019  ? ? ?Patient Active Problem List  ? Diagnosis Date Noted  ? Left arm pain 06/18/2021  ? Seasonal allergies 06/18/2021  ? Acute systolic congestive heart failure (HCC) 05/14/2021  ? Acute pulmonary embolism (HCC) 05/12/2021  ? COVID-19 05/12/2021  ? Hemoptysis 05/12/2021  ? Heart failure (HCC) 04/30/2021  ? RA (rheumatoid arthritis) (HCC) 04/13/2021  ? Acute encephalopathy 04/12/2021  ? Carotid sinus syncope 04/12/2021  ? Ventricular ectopy 04/12/2021  ? NSVT (nonsustained ventricular tachycardia) 04/12/2021  ? Low cardiac output syndrome (HCC) 04/12/2021  ? Alcohol abuse, uncomplicated 02/28/2021  ? Acute on chronic combined systolic (congestive) and diastolic (congestive) heart failure (HCC) 02/28/2021  ? History of cardiac cath 01/14/2021  ? Dyspnea on exertion 11/28/2020  ? External hemorrhoid, bleeding 06/12/2020  ? Bilateral carpal tunnel syndrome 01/04/2020  ? Depressed left ventricular ejection fraction 12/30/2019  ? Nonischemic cardiomyopathy (HCC) 12/30/2019  ? Dilated cardiomyopathy (HCC) 11/30/2019  ? Abnormal EKG 11/30/2019  ? RBBB 11/30/2019  ? 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 Cardiomegaly 11/16/2019  ? Trigger thumb, left thumb 03/22/2019  ? Status post cervical spinal fusion 02/22/2019  ? Chronic neck pain 02/22/2019  ? Irritability and anger 02/22/2019  ? History of opioid abuse (HCC) 02/22/2019  ? Radiculopathy 02/17/2019  ? Splenic flexure syndrome  10/04/2018  ? 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 BPPV (benign paroxysmal positional vertigo), right 07/27/2018  ? Hearing loss of left ear 07/20/2018  ? Eustachian tube dysfunction, bilateral 02/23/2018  ? Tobacco dependence 12/26/2017  ? Sleep disturbance 12/26/2017  ? Encounter for medication management 12/26/2017  ? Paresthesias 12/26/2017  ? 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 Colon cancer screening 08/03/2017  ? Poor dentition 05/05/2017  ? Lack of immunity to hepatitis B virus demonstrated by  serologic test 04/24/2017  ? Transaminitis 04/23/2017  ? Chronic active hepatitis (HCC) 04/23/2017  ? Hypertension goal BP (blood pressure) < 130/80 04/18/2017  ? Degeneration of intervertebral disc of cervical region 07/27/2012  ? Rheumatoid arthritis involving both elbows (HCC) 07/27/2012  ? H/O drug abuse (HCC) 07/27/2012  ? ? ?Past Surgical History:  ?Procedure Laterality Date  ? ANTERIOR CERVICAL DECOMP/DISCECTOMY FUSION N/A 02/17/2019  ? Procedure: ANTERIOR CERVICAL DECOMPRESSION FUSION, CERVICAL 5-6, CERVICAL 6-7 WITH INSTRUMENTATION AND ALLOGRAFT;  Surgeon: Estill Bamberg, MD;  Location: [facility] OR;  Service: Orthopedics;  Laterality: N/A;  ? HIP SURGERY  2005  ? RIGHT/LEFT HEART CATH AND CORONARY ANGIOGRAPHY N/A 12/08/2019  ? Procedure: RIGHT/LEFT HEART CATH AND CORONARY ANGIOGRAPHY;  Surgeon: Marykay Lex, MD;  Location: [facility] INVASIVE CV LAB;  Service: Cardiovascular;  Laterality: N/A;  ? SHOULDER SURGERY  1985  ? ? ? ? ? ?Home Medications   ? ?Prior to Admission medications   ?Medication Sig Start Date End Date Taking? Authorizing Provider  ?apixaban (ELIQUIS) 5 MG TABS tablet Take 1 tablet (5 mg total) by mouth 2 (two) times daily. 05/14/21  Yes Christen Butter, NP  ?busPIRone (BUSPAR) 15 MG tablet Take 1 tablet (15 mg total) by mouth 2 (two) times daily. 09/07/21  Yes Christen Butter, NP  ?digoxin (LANOXIN) 0.125 MG tablet Take 1 tablet (125 mcg total) by mouth daily. 11/11/21  Yes Lewayne Bunting, MD  ?escitalopram (LEXAPRO) 10 MG tablet Take 1/2 tablet (5mg ) daily for 8 days then increase to 1 tablet (10mg ) daily. 09/07/21  Yes Christen Butter, NP  ?furosemide (LASIX) 40 MG tablet Take 1 tablet (40 mg total) by mouth daily. 05/14/21  Yes Christen Butter, NP  ?JARDIANCE 10 MG TABS tablet Take 10 mg by mouth daily. 06/27/21  Yes [provider]  ?methylPREDNISolone (MEDROL DOSEPAK) 4 MG TBPK tablet tad 11/16/21  Yes Eustace Moore, MD  ?metoprolol succinate (TOPROL XL) 25 MG 24 hr tablet Take 0.5 tablets (12.5 mg  total) by mouth daily. 11/11/21  Yes Lewayne Bunting, MD  ?nitroGLYCERIN (NITROSTAT) 0.4 MG SL tablet SMARTSIG:1 Tablet(s) Sublingual PRN 08/15/21  Yes [provider]  ?Omega-3 Fatty Acids (FISH OIL PO) Take 6,000 mg by mouth daily.    Yes [provider]  ?sacubitril-valsartan (ENTRESTO) 49-51 MG Take 1 tablet by mouth 2 (two) times daily. 11/11/21  Yes Lewayne Bunting, MD  ?spironolactone (ALDACTONE) 25 MG tablet Take 0.5 tablets (12.5 mg total) by mouth daily. 11/11/21 02/09/22 Yes Lewayne Bunting, MD  ?tiZANidine (ZANAFLEX) 4 MG tablet Take 1-2 tablets (4-8 mg total) by mouth every 6 (six) hours as needed for muscle spasms. 11/16/21  Yes Eustace Moore, MD  ? ? ?Family History ?Family History  ?Adopted: Yes  ?Family history unknown: Yes  ? ? ?Social History ?Social History  ? ?Tobacco Use  ? Smoking status: Every Day  ?  Packs/day: 0.50  ?  Years: 47.00  ?  Pack years: 23.50  ?  Types: Cigarettes  ? Smokeless tobacco:  Never  ?Vaping Use  ? Vaping Use: Never used  ?Substance Use Topics  ? Alcohol use: Not Currently  ?  Alcohol/week: 42.0 - 56.0 standard drinks  ?  Types: 42 - 56 Standard drinks or equivalent per week  ? Drug use: Not Currently  ?  Types: Heroin  ?  Comment: sober since 2004  ? ? ? ?Allergies   ?Patient has no known allergies. ? ? ?Review of Systems ?Review of Systems ? ?See HPI ?Physical Exam ?Triage Vital Signs ?ED Triage Vitals  ?Enc Vitals Group  ?   BP 11/16/21 1350 99/70  ?   Pulse Rate 11/16/21 1350 69  ?   Resp 11/16/21 1350 18  ?   Temp 11/16/21 1350 98.1 ?F (36.7 ?C)  ?   Temp Source 11/16/21 1350 Oral  ?   SpO2 11/16/21 1350 100 %  ?   Weight 11/16/21 1348 155 lb (70.3 kg)  ?   Height 11/16/21 1348 5\' 9"  (1.753 m)  ?   Head Circumference --   ?   Peak Flow --   ?   Pain Score 11/16/21 1348 9  ?   Pain Loc --   ?   Pain Edu? --   ?   Excl. in GC? --   ? ?No data found. ? ?Updated Vital Signs ?BP 99/70 (BP Location: Right Arm)   Pulse 69   Temp 98.1 ?F (36.7 ?C)  (Oral)   Resp 18   Ht 5\' 9"  (1.753 m)   Wt 70.3 kg   SpO2 100%   BMI 22.89 kg/m?  ?    ? ?Physical Exam ?Constitutional:   ?   General: He is not in acute distress. ?   Appearance: He is well-developed an

## 2021-11-16 NOTE — ED Triage Notes (Signed)
Pt states that he has some lower back pain. X4 days ? ?

## 2021-11-16 NOTE — Discharge Instructions (Signed)
Activity as tolerated.  Avoid bending and lifting while back is painful ?Take the Medrol Dosepak as directed.  This is the steroid to help with nerve inflammation ?Take tizanidine as needed as muscle relaxer.  This is useful at bedtime ?Follow-up with your usual doctor if not improving by next week ?

## 2021-11-28 ENCOUNTER — Ambulatory Visit (INDEPENDENT_AMBULATORY_CARE_PROVIDER_SITE_OTHER): Payer: Self-pay

## 2021-11-28 ENCOUNTER — Ambulatory Visit (INDEPENDENT_AMBULATORY_CARE_PROVIDER_SITE_OTHER): Payer: Self-pay | Admitting: Medical-Surgical

## 2021-11-28 ENCOUNTER — Encounter: Payer: Self-pay | Admitting: Medical-Surgical

## 2021-11-28 VITALS — BP 139/88 | HR 59 | Resp 18 | Ht 69.0 in | Wt 165.0 lb

## 2021-11-28 DIAGNOSIS — M25551 Pain in right hip: Secondary | ICD-10-CM

## 2021-11-28 DIAGNOSIS — M545 Low back pain, unspecified: Secondary | ICD-10-CM

## 2021-11-28 MED ORDER — PREDNISONE 50 MG PO TABS: 50.0000 mg | ORAL_TABLET | Freq: Every day | ORAL | 0 refills | Status: DC

## 2021-11-28 MED ORDER — TRAMADOL HCL 50 MG PO TABS: 50.0000 mg | ORAL_TABLET | Freq: Three times a day (TID) | ORAL | 0 refills | Status: AC | PRN

## 2021-11-28 NOTE — Progress Notes (Addendum)
?  HPI with pertinent ROS:  ? ?CC: severe right hip pain ? ?HPI: ?Pleasant 61 year old male presenting today for evaluation of continued hip pain. Was seen at [facility] on 3/18 and sent home with a Medrol dosepak and Tizanidine. Finished the dosepak and notes that it helped the back pain but he still has sharp stabbing pain in his right buttock/hip area. Pain in posterior and lateral. He points to the SI joint area as the main site of discomfort. On blood thinners so cannot take anti-inflammatories. Not taking anything else for pain. Pain worse with bending to put shoes on and when sitting/standing in certain positions. Has difficulty with standing up due to the pain at times.  ? ?I reviewed the past medical history, family history, social history, surgical history, and allergies today and no changes were needed.  Please see the problem list section below in epic for further details. ? ? ?Physical exam:  ? ?General: Well Developed, well nourished, and in no acute distress.  ?Neuro: Alert and oriented x3.  ?HEENT: Normocephalic, atraumatic.  ?Skin: Warm and dry. ?Cardiac: Regular rate and rhythm, no murmurs rubs or gallops, no lower extremity edema.  ?Respiratory: Clear to auscultation bilaterally. Not using accessory muscles, speaking in full sentences. ?MSK: Exam unremarkable with passive ROM including - FABER, FADIR, and straight leg raise bilaterally. Pain reproduced with active full flexion of the right hip. ? ?Impression and Recommendations:   ? ?1. Right hip pain ?Suspect this is more lumbar spine/SI joint dysfunction but will go ahead and image the lumbar spine and right hip today. Adding another short burst of prednisone 50mg  for 5 days then will avoid further steroids for a bit. Tramadol for severe pain. Referring to PT. ? ?Return in about 4 weeks (around 12/26/2021) for right hip pain follow up with Dr T. ?___________________________________________ ?Thayer Ohm, DNP, APRN, FNP-BC ?Primary Care and Sports  Medicine ?[facility] ?

## 2021-11-28 NOTE — Addendum Note (Signed)
Addended bySamuel Bouche on: 11/28/2021 12:54 PM ? ? Modules accepted: Orders ? ?

## 2021-11-30 ENCOUNTER — Encounter: Payer: Self-pay | Admitting: Medical-Surgical

## 2021-12-23 ENCOUNTER — Ambulatory Visit: Payer: Self-pay | Admitting: Sports Medicine

## 2021-12-25 ENCOUNTER — Ambulatory Visit: Payer: Self-pay | Admitting: Physical Therapy

## 2021-12-30 ENCOUNTER — Encounter: Payer: Self-pay | Admitting: Physical Therapy

## 2022-01-01 DIAGNOSIS — T1490XA Injury, unspecified, initial encounter: Secondary | ICD-10-CM | POA: Insufficient documentation

## 2022-01-01 DIAGNOSIS — S82141C Displaced bicondylar fracture of right tibia, initial encounter for open fracture type IIIA, IIIB, or IIIC: Secondary | ICD-10-CM | POA: Insufficient documentation

## 2022-01-06 ENCOUNTER — Encounter: Payer: Self-pay | Admitting: Physical Therapy

## 2022-01-08 ENCOUNTER — Encounter: Payer: Self-pay | Admitting: Physical Therapy

## 2022-01-20 ENCOUNTER — Telehealth: Payer: Self-pay | Admitting: General Practice

## 2022-01-20 NOTE — Telephone Encounter (Signed)
Transition Care Management Follow-up Telephone Call Date of discharge and from where: 01/15/22 from [facility] How have you been since you were released from the hospital? Doing better. He was able to walk four steps today.  Any questions or concerns? No  Items Reviewed: Did the pt receive and understand the discharge instructions provided? Yes  Medications obtained and verified? No  Other? No  Any new allergies since your discharge? No  Dietary orders reviewed? Yes Do you have support at home? Yes   Home Care and Equipment/Supplies: Were home health services ordered? no  Functional Questionnaire: (I = Independent and D = Dependent) ADLs: D  Bathing/Dressing- I  Meal Prep- I  Eating- I  Maintaining continence- I  Transferring/Ambulation- I  Managing Meds- I  Follow up appointments reviewed:  PCP Hospital f/u appt confirmed? No  he will call back to schedule. Specialist Hospital f/u appt confirmed? Yes  Scheduled to see the Wound care specialist on 01/22/22. Are transportation arrangements needed? No  If their condition worsens, is the pt aware to call PCP or go to the Emergency Dept.? Yes Was the patient provided with contact information for the PCP's office or ED? Yes Was to pt encouraged to call back with questions or concerns? Yes

## 2022-01-21 ENCOUNTER — Telehealth: Payer: Self-pay

## 2022-01-21 DIAGNOSIS — S82141J Displaced bicondylar fracture of right tibia, subsequent encounter for open fracture type IIIA, IIIB, or IIIC with delayed healing: Secondary | ICD-10-CM

## 2022-01-21 MED ORDER — AMBULATORY NON FORMULARY MEDICATION: 0 refills | Status: DC

## 2022-01-21 NOTE — Telephone Encounter (Signed)
Brittney called and left a message stating he needs a wheel chair ASAP.  Pended prescription.

## 2022-01-21 NOTE — Telephone Encounter (Signed)
Prescription printed and signed. Need to know where to fax the script to. Does he  have a preferred DME company?  ___________________________________________ Thayer Ohm, DNP, APRN, FNP-BC Primary Care and Sports Medicine [facility]

## 2022-01-23 ENCOUNTER — Other Ambulatory Visit: Payer: Self-pay | Admitting: Medical-Surgical

## 2022-01-23 DIAGNOSIS — S82141J Displaced bicondylar fracture of right tibia, subsequent encounter for open fracture type IIIA, IIIB, or IIIC with delayed healing: Secondary | ICD-10-CM

## 2022-01-23 MED ORDER — AMBULATORY NON FORMULARY MEDICATION: 0 refills | Status: AC

## 2022-01-23 NOTE — Telephone Encounter (Signed)
Faxed the Rx for the wheelchair to [facility]  Fax #:[phone]  Fax confirmation successful

## 2022-01-31 ENCOUNTER — Inpatient Hospital Stay (HOSPITAL_BASED_OUTPATIENT_CLINIC_OR_DEPARTMENT_OTHER): Admission: RE | Admit: 2022-01-31 | Payer: Medicaid Other | Source: Ambulatory Visit

## 2022-02-02 DIAGNOSIS — S81802A Unspecified open wound, left lower leg, initial encounter: Secondary | ICD-10-CM | POA: Insufficient documentation

## 2022-02-05 ENCOUNTER — Ambulatory Visit (INDEPENDENT_AMBULATORY_CARE_PROVIDER_SITE_OTHER): Payer: Self-pay | Admitting: Sports Medicine

## 2022-02-05 ENCOUNTER — Ambulatory Visit (INDEPENDENT_AMBULATORY_CARE_PROVIDER_SITE_OTHER): Payer: Self-pay

## 2022-02-05 DIAGNOSIS — G5603 Carpal tunnel syndrome, bilateral upper limbs: Secondary | ICD-10-CM

## 2022-02-05 DIAGNOSIS — S82202A Unspecified fracture of shaft of left tibia, initial encounter for closed fracture: Secondary | ICD-10-CM | POA: Insufficient documentation

## 2022-02-05 DIAGNOSIS — S82202S Unspecified fracture of shaft of left tibia, sequela: Secondary | ICD-10-CM

## 2022-02-05 NOTE — Assessment & Plan Note (Signed)
Management per Ortho traumatology.
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
## 2022-02-18 ENCOUNTER — Ambulatory Visit (HOSPITAL_BASED_OUTPATIENT_CLINIC_OR_DEPARTMENT_OTHER)
Admission: RE | Admit: 2022-02-18 | Discharge: 2022-02-18 | Disposition: A | Discharge status: Home / Self Care | Payer: Medicaid Other | Source: Ambulatory Visit | Attending: Cardiology | Admitting: Cardiology

## 2022-02-18 DIAGNOSIS — I428 Other cardiomyopathies: Secondary | ICD-10-CM | POA: Insufficient documentation

## 2022-02-18 LAB — ECHOCARDIOGRAM COMPLETE
AR max vel: 1.67 cm2
AV Area VTI: 1.63 cm2
AV Area mean vel: 1.74 cm2
AV Mean grad: 4 mmHg
AV Peak grad: 8.8 mmHg
Ao pk vel: 1.48 m/s
Area-P 1/2: 4.01 cm2
Calc EF: 36 %
MV M vel: 5.54 m/s
MV Peak grad: 122.8 mmHg
S' Lateral: 5.5 cm
Single Plane A2C EF: 30.6 %
Single Plane A4C EF: 43.6 %

## 2022-02-18 NOTE — Progress Notes (Signed)
  Echocardiogram 2D Echocardiogram has been performed.  Melvin Rich F 02/18/2022, 11:43 AM

## 2022-04-07 ENCOUNTER — Telehealth: Payer: Self-pay | Admitting: General Practice

## 2022-04-07 NOTE — Telephone Encounter (Signed)
Transition Care Management Follow-up Telephone Call Date of discharge and from where: 04/06/22 from [facility] How have you been since you were released from the hospital? Patient is doing ok.Still waiting to get the pacemaker. Any questions or concerns? No  Items Reviewed: Did the pt receive and understand the discharge instructions provided? Yes  Medications obtained and verified? No  Other? No  Any new allergies since your discharge? No  Dietary orders reviewed? Yes Do you have support at home? Yes   Home Care and Equipment/Supplies: Were home health services ordered? no  Functional Questionnaire: (I = Independent and D = Dependent) ADLs: I  Bathing/Dressing- I  Meal Prep- I  Eating- I  Maintaining continence- I  Transferring/Ambulation- I  Managing Meds- I  Follow up appointments reviewed:  PCP Hospital f/u appt confirmed? No   Specialist Hospital f/u appt confirmed? No   Are transportation arrangements needed? No  If their condition worsens, is the pt aware to call PCP or go to the Emergency Dept.? Yes Was the patient provided with contact information for the PCP's office or ED? Yes Was to pt encouraged to call back with questions or concerns? Yes

## 2022-04-16 DIAGNOSIS — T8141XA Infection following a procedure, superficial incisional surgical site, initial encounter: Secondary | ICD-10-CM | POA: Insufficient documentation

## 2022-04-26 ENCOUNTER — Emergency Department (HOSPITAL_BASED_OUTPATIENT_CLINIC_OR_DEPARTMENT_OTHER)
Admission: EM | Admit: 2022-04-26 | Discharge: 2022-04-26 | Disposition: A | Discharge status: Home / Self Care | Payer: Self-pay | Attending: Emergency Medicine | Admitting: Emergency Medicine

## 2022-04-26 ENCOUNTER — Emergency Department (HOSPITAL_BASED_OUTPATIENT_CLINIC_OR_DEPARTMENT_OTHER): Payer: Self-pay

## 2022-04-26 ENCOUNTER — Other Ambulatory Visit: Payer: Self-pay

## 2022-04-26 ENCOUNTER — Encounter (HOSPITAL_BASED_OUTPATIENT_CLINIC_OR_DEPARTMENT_OTHER): Payer: Self-pay | Admitting: Emergency Medicine

## 2022-04-26 DIAGNOSIS — Z79899 Other long term (current) drug therapy: Secondary | ICD-10-CM | POA: Insufficient documentation

## 2022-04-26 DIAGNOSIS — L03116 Cellulitis of left lower limb: Secondary | ICD-10-CM | POA: Insufficient documentation

## 2022-04-26 DIAGNOSIS — I4892 Unspecified atrial flutter: Secondary | ICD-10-CM | POA: Insufficient documentation

## 2022-04-26 DIAGNOSIS — Z7901 Long term (current) use of anticoagulants: Secondary | ICD-10-CM | POA: Insufficient documentation

## 2022-04-26 DIAGNOSIS — I509 Heart failure, unspecified: Secondary | ICD-10-CM | POA: Insufficient documentation

## 2022-04-26 DIAGNOSIS — I110 Hypertensive heart disease with heart failure: Secondary | ICD-10-CM | POA: Insufficient documentation

## 2022-04-26 LAB — CBC WITH DIFFERENTIAL/PLATELET
Abs Immature Granulocytes: 0.01 10*3/uL (ref 0.00–0.07)
Basophils Absolute: 0 10*3/uL (ref 0.0–0.1)
Basophils Relative: 0 %
Eosinophils Absolute: 0.1 10*3/uL (ref 0.0–0.5)
Eosinophils Relative: 2 %
HCT: 38.5 % — ABNORMAL LOW (ref 39.0–52.0)
Hemoglobin: 12.3 g/dL — ABNORMAL LOW (ref 13.0–17.0)
Immature Granulocytes: 0 %
Lymphocytes Relative: 32 %
Lymphs Abs: 1.5 10*3/uL (ref 0.7–4.0)
MCH: 25.9 pg — ABNORMAL LOW (ref 26.0–34.0)
MCHC: 31.9 g/dL (ref 30.0–36.0)
MCV: 81.1 fL (ref 80.0–100.0)
Monocytes Absolute: 0.6 10*3/uL (ref 0.1–1.0)
Monocytes Relative: 13 %
Neutro Abs: 2.5 10*3/uL (ref 1.7–7.7)
Neutrophils Relative %: 53 %
Platelets: 271 10*3/uL (ref 150–400)
RBC: 4.75 MIL/uL (ref 4.22–5.81)
RDW: 16.7 % — ABNORMAL HIGH (ref 11.5–15.5)
WBC: 4.7 10*3/uL (ref 4.0–10.5)
nRBC: 0 % (ref 0.0–0.2)

## 2022-04-26 LAB — COMPREHENSIVE METABOLIC PANEL
ALT: 16 U/L (ref 0–44)
AST: 23 U/L (ref 15–41)
Albumin: 3.8 g/dL (ref 3.5–5.0)
Alkaline Phosphatase: 178 U/L — ABNORMAL HIGH (ref 38–126)
Anion gap: 9 (ref 5–15)
BUN: 28 mg/dL — ABNORMAL HIGH (ref 8–23)
CO2: 21 mmol/L — ABNORMAL LOW (ref 22–32)
Calcium: 9.1 mg/dL (ref 8.9–10.3)
Chloride: 105 mmol/L (ref 98–111)
Creatinine, Ser: 0.57 mg/dL — ABNORMAL LOW (ref 0.61–1.24)
GFR, Estimated: >60 mL/min (ref >60)
Glucose, Bld: 81 mg/dL (ref 70–99)
Potassium: 3.6 mmol/L (ref 3.5–5.1)
Sodium: 135 mmol/L (ref 135–145)
Total Bilirubin: 0.7 mg/dL (ref 0.3–1.2)
Total Protein: 7.4 g/dL (ref 6.5–8.1)

## 2022-04-26 LAB — LACTIC ACID, PLASMA: Lactic Acid, Venous: 1.1 mmol/L (ref 0.5–1.9)

## 2022-04-26 MED ORDER — VANCOMYCIN HCL 1500 MG/300ML IV SOLN: 1500.0000 mg | Freq: Once | INTRAVENOUS | Status: DC

## 2022-04-26 MED ORDER — VANCOMYCIN HCL IN DEXTROSE 1-5 GM/200ML-% IV SOLN
1000.0000 mg | Freq: Once | INTRAVENOUS | Status: AC
  Administered 2022-04-26: 1000 mg via INTRAVENOUS
  Filled 2022-04-26: qty 200

## 2022-04-26 MED ORDER — VANCOMYCIN HCL 500 MG IV SOLR
500.0000 mg | Freq: Once | INTRAVENOUS | Status: AC
  Administered 2022-04-26: 500 mg via INTRAVENOUS

## 2022-04-26 MED ORDER — DOXYCYCLINE HYCLATE 100 MG PO CAPS: 100.0000 mg | ORAL_CAPSULE | Freq: Two times a day (BID) | ORAL | 0 refills | Status: DC

## 2022-04-26 MED ORDER — SODIUM CHLORIDE 0.9 % IV SOLN
1.0000 g | Freq: Once | INTRAVENOUS | Status: AC
  Administered 2022-04-26: 1 g via INTRAVENOUS
  Filled 2022-04-26: qty 10

## 2022-04-26 MED ORDER — ONDANSETRON HCL 4 MG/2ML IJ SOLN
4.0000 mg | Freq: Once | INTRAMUSCULAR | Status: AC
  Administered 2022-04-26: 4 mg via INTRAVENOUS
  Filled 2022-04-26: qty 2

## 2022-04-26 MED ORDER — MORPHINE SULFATE (PF) 4 MG/ML IV SOLN
4.0000 mg | Freq: Once | INTRAVENOUS | Status: AC
  Administered 2022-04-26: 4 mg via INTRAVENOUS
  Filled 2022-04-26: qty 1

## 2022-04-26 MED ORDER — VANCOMYCIN HCL 1500 MG/300ML IV SOLN
1500.0000 mg | Freq: Once | INTRAVENOUS | Status: DC
  Filled 2022-04-26: qty 300

## 2022-04-26 NOTE — ED Notes (Signed)
Pt vanc not given at time ordered due to pharmacy and dosing. Provider aware

## 2022-04-26 NOTE — ED Notes (Signed)
Provider assessed pt and said to go ahead with finishing dose of meds and monitor for any changes

## 2022-04-26 NOTE — ED Notes (Addendum)
Pt having redness and itching  around IV site. Med was not going at this time., Provider aware.Pt denies any pain, shob. Pt eating crackers and drinking coffee at this time vitals are 127/95 , 100 O2

## 2022-04-26 NOTE — Progress Notes (Signed)
Pharmacy Antibiotic Note  Melvin Rich is a 61 y.o. male for which pharmacy has been consulted for vancomycin dosing for cellulitis.  Plan: Vancomycin 1500 mg once in ED Ceftriaxone per MD Per ED MD note patient to discharge after IV abx will not enter maintenance vancomycin dose at this time -- will dose further if not discharged  Height: 5\' 9"  (175.3 cm) Weight: 64.4 kg (142 lb) IBW/kg (Calculated) : 70.7  Temp (24hrs), Avg:97.9 F (36.6 C), Min:97.9 F (36.6 C), Max:97.9 F (36.6 C)  No results for input(s): "WBC", "CREATININE", "LATICACIDVEN", "VANCOTROUGH", "VANCOPEAK", "VANCORANDOM", "GENTTROUGH", "GENTPEAK", "GENTRANDOM", "TOBRATROUGH", "TOBRAPEAK", "TOBRARND", "AMIKACINPEAK", "AMIKACINTROU", "AMIKACIN" in the last 168 hours.  CrCl cannot be calculated (Patient's most recent lab result is older than the maximum 21 days allowed.).    No Known Allergies  Thank you for allowing pharmacy to be a part of this patient's care.  , PharmD, BCPS 04/26/2022 1:59 PM ED Clinical Pharmacist -  [phone]
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
## 2022-04-28 ENCOUNTER — Telehealth: Payer: Self-pay | Admitting: General Practice

## 2022-04-28 NOTE — Telephone Encounter (Signed)
Transition Care Management Unsuccessful Follow-up Telephone Call  Date of discharge and from where:  04/26/22 from [facility]  Attempts:  1st Attempt  Reason for unsuccessful TCM follow-up call:  No answer/busy

## 2022-04-30 NOTE — Telephone Encounter (Signed)
Transition Care Management Unsuccessful Follow-up Telephone Call  Date of discharge and from where:  04/26/22 from [facility]  Attempts:  2nd Attempt  Reason for unsuccessful TCM follow-up call:  No answer/busy

## 2022-05-01 LAB — CULTURE, BLOOD (ROUTINE X 2): Culture: NO GROWTH

## 2022-05-02 NOTE — Telephone Encounter (Signed)
Transition Care Management Unsuccessful Follow-up Telephone Call  Date of discharge and from where:  04/26/22 from [facility]  Attempts:  3rd Attempt  Reason for unsuccessful TCM follow-up call:  No answer/busy

## 2022-05-06 NOTE — Progress Notes (Deleted)
HPI: FU CM. Previously followed by Dr Servando Salina and at [facility].  Cardiac catheterization April 2021 showed normal coronary arteries and pulmonary capillary wedge pressure 20 to 24 mmHg. Echocardiogram September 2022 showed ejection fraction less than 20%,  severe left ventricular enlargement, moderate diastolic dysfunction, moderate left atrial enlargement, moderate right atrial enlargement, mild mitral regurgitation, moderate tricuspid regurgitation.  Hospitalizations with inotropes previously.  He has been felt not to be a candidate for advanced therapies at [facility] as his cardiomyopathy is felt secondary to alcohol and substance abuse. Also on apixaban for history of pulmonary embolus.  Patient had ICD implanted October 2022 which was complicated by infection.  The device was therefore explanted.  Cultures positive for staph aureus but blood cultures were negative.  It was recommended that he be treated with linezolid for 14 days.  He subsequently developed abscess formation by his report.  He underwent I&D by surgery.  Echocardiogram repeated February 18, 2022 and showed ejection fraction 30 to 35%, moderate left ventricular enlargement, grade 1 diastolic dysfunction, mild left atrial enlargement, mild to moderate mitral regurgitation.  Left lower extremity venous Dopplers April 26, 2022 showed no DVT on the left.  Treated recently for lower extremity cellulitis.  Since last seen   Current Outpatient Medications  Medication Sig Dispense Refill   AMBULATORY NON FORMULARY MEDICATION Dx  Tibial plateau fracture, right, open type III - Wheel chair 1 each 0   apixaban (ELIQUIS) 5 MG TABS tablet Take 1 tablet (5 mg total) by mouth 2 (two) times daily. 60 tablet 3   busPIRone (BUSPAR) 15 MG tablet Take 1 tablet (15 mg total) by mouth 2 (two) times daily. 60 tablet 0   digoxin (LANOXIN) 0.125 MG tablet Take 1 tablet (125 mcg total) by mouth daily. 90 tablet 3   doxycycline (VIBRAMYCIN) 100 MG capsule Take 1  capsule (100 mg total) by mouth 2 (two) times daily. 14 capsule 0   escitalopram (LEXAPRO) 10 MG tablet Take 1/2 tablet (5mg ) daily for 8 days then increase to 1 tablet (10mg ) daily. 30 tablet 1   furosemide (LASIX) 40 MG tablet Take 1 tablet (40 mg total) by mouth daily. 90 tablet 1   JARDIANCE 10 MG TABS tablet Take 10 mg by mouth daily.     metoprolol succinate (TOPROL XL) 25 MG 24 hr tablet Take 0.5 tablets (12.5 mg total) by mouth daily. 45 tablet 1   nitroGLYCERIN (NITROSTAT) 0.4 MG SL tablet SMARTSIG:1 Tablet(s) Sublingual PRN     Omega-3 Fatty Acids (FISH OIL PO) Take 6,000 mg by mouth daily.      predniSONE (DELTASONE) 50 MG tablet Take 1 tablet (50 mg total) by mouth daily. 5 tablet 0   sacubitril-valsartan (ENTRESTO) 49-51 MG Take 1 tablet by mouth 2 (two) times daily. 90 tablet 3   spironolactone (ALDACTONE) 25 MG tablet Take 0.5 tablets (12.5 mg total) by mouth daily. 45 tablet 3   tiZANidine (ZANAFLEX) 4 MG tablet Take 1-2 tablets (4-8 mg total) by mouth every 6 (six) hours as needed for muscle spasms. (Patient not taking: Reported on 11/28/2021) 21 tablet 0   No current facility-administered medications for this visit.     Past Medical History:  Diagnosis Date   Alcohol use disorder    BPPV (benign paroxysmal positional vertigo), right 07/27/2018   Cardiomegaly 11/16/2019   CHF (congestive heart failure) (HCC)    Chronic active hepatitis (HCC) 04/23/2017   Pt stated that he had the treatment  Chronic hepatitis C virus infection (HCC) 04/23/2017   COVID-19    DDD (degenerative disc disease), cervical 04/18/2017   Post C5-C7 ACDF with Dr. 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   Substance abuse (HCC)    Transaminitis 04/23/2017   Trigger thumb, left thumb 03/22/2019    Past Surgical History:  Procedure Laterality Date   ANTERIOR CERVICAL DECOMP/DISCECTOMY FUSION N/A 02/17/2019   Procedure: ANTERIOR CERVICAL DECOMPRESSION FUSION, CERVICAL 5-6, CERVICAL 6-7 WITH INSTRUMENTATION AND ALLOGRAFT;  Surgeon: Estill Bamberg, MD;  Location: [facility] OR;  Service: Orthopedics;  Laterality: N/A;   DEBRIDEMENT LEG Left    HIP SURGERY  2005   RIGHT/LEFT HEART CATH AND CORONARY ANGIOGRAPHY N/A 12/08/2019   Procedure: RIGHT/LEFT HEART CATH AND CORONARY ANGIOGRAPHY;  Surgeon: Marykay Lex, MD;  Location: [facility] INVASIVE CV LAB;  Service: Cardiovascular;  Laterality: N/A;   SHOULDER SURGERY  1985   TIBIA FRACTURE SURGERY Left     Social History   Socioeconomic History   Marital status: Married    Spouse name: Not on file   Number of children: Not on file   Years of education: Not on file   Highest education level: Not on file  Occupational History   Not on file  Tobacco Use   Smoking status: Every Day    Packs/day: 1.00    Years: 47.00    Total pack years: 47.00    Types: Cigarettes   Smokeless tobacco: Never   Tobacco comments:    1 pack a day   Vaping Use   Vaping Use: Never used  Substance and Sexual Activity   Alcohol use: Not Currently    Alcohol/week: 42.0 - 56.0 standard drinks of alcohol    Types: 42 - 56 Standard drinks or equivalent per week   Drug use: Not Currently    Types: Heroin    Comment: sober since 2004   Sexual activity: Not Currently    Birth control/protection: None  Other Topics Concern   Not on file  Social History Narrative   Not on file   Social  Determinants of Health   Financial Resource Strain: Not on file  Food Insecurity: Not on file  Transportation Needs: Not on file  Physical Activity: Not on file  Stress: Not on file  Social Connections: Not on file  Intimate Partner Violence: Not on file    Family History  Adopted: Yes  Family history unknown: Yes    ROS: no fevers or chills, productive cough, hemoptysis, dysphasia, odynophagia, melena, hematochezia, dysuria, hematuria, rash, seizure activity, orthopnea, PND, pedal edema, claudication. Remaining systems are negative.  Physical Exam: Well-developed well-nourished in no acute distress.  Skin is warm and dry.  HEENT is normal.  Neck is supple.  Chest is clear to auscultation with normal expansion.  Cardiovascular exam is regular rate and rhythm.  Abdominal exam  nontender or distended. No masses palpated. Extremities show no edema. neuro grossly intact  ECG- personally reviewed  A/P  1 nonischemic cardiomyopathy-continue Entresto and Toprol.  Cardiomyopathy is felt possibly secondary to previous alcohol and substance abuse.  He was evaluated at [facility] and felt not to be a candidate for advanced therapies.  Ejection fraction remains 30 to 35%.   2 chronic systolic congestive heart failure-he is euvolemic today.  Continue Lasix, spironolactone and Jardiance at present dose.  3 prior ICD insertion for primary prevention-patient developed infection following previous ICD implantation requiring I&D of abscess.  Ejection fraction remains decreased.  Now that his previous infection has resolved I have recommended referral to electrophysiology for ICD.  4 hypertension-blood pressure controlled.  Continue present medications.  5 prior pulmonary embolus-continue apixaban.  6 tobacco abuse-patient again counseled on discontinuing.  7 history of alcohol abuse-patient denies.  Olga Millers, MD

## 2022-05-14 ENCOUNTER — Ambulatory Visit (INDEPENDENT_AMBULATORY_CARE_PROVIDER_SITE_OTHER): Payer: Self-pay | Admitting: Sports Medicine

## 2022-05-14 ENCOUNTER — Ambulatory Visit: Payer: Medicaid Other | Admitting: Cardiology

## 2022-05-14 ENCOUNTER — Ambulatory Visit (INDEPENDENT_AMBULATORY_CARE_PROVIDER_SITE_OTHER): Payer: Self-pay

## 2022-05-14 DIAGNOSIS — G5603 Carpal tunnel syndrome, bilateral upper limbs: Secondary | ICD-10-CM

## 2022-05-14 NOTE — Progress Notes (Signed)
    Procedures performed today:    Procedure: Real-time Ultrasound Guided hydrodissection of the right median nerve at the carpal tunnel Device: Samsung HS60 Verbal informed consent obtained.  Time-out conducted.  Noted no overlying erythema, induration, or other signs of local infection.  Skin prepped in a sterile fashion.  Local anesthesia: Topical Ethyl chloride.  With sterile technique and under real time ultrasound guidance: Noted enlarged median nerve.  Using a 25-gauge needle advanced into the carpal tunnel, taking care to avoid intraneural injection I injected medication both superficial to and deep to the median nerve freeing it from surrounding structures, I then redirected the needle deep and injected further medication around the flexor tendons deep within the carpal tunnel for a total of 1 cc kenalog 40, 5 cc 1% lidocaine without epinephrine. Completed without difficulty  Advised to call if fevers/chills, erythema, induration, drainage, or persistent bleeding.  Images permanently stored and available for review in PACS.  Impression: Technically successful ultrasound guided median nerve hydrodissection.  Procedure: Real-time Ultrasound Guided hydrodissection of the left median nerve at the carpal tunnel Device: Samsung HS60 Verbal informed consent obtained.  Time-out conducted.  Noted no overlying erythema, induration, or other signs of local infection.  Skin prepped in a sterile fashion.  Local anesthesia: Topical Ethyl chloride.  With sterile technique and under real time ultrasound guidance: Noted enlarged median nerve.  Using a 25-gauge needle advanced into the carpal tunnel, taking care to avoid intraneural injection I injected medication both superficial to and deep to the median nerve freeing it from surrounding structures, I then redirected the needle deep and injected further medication around the flexor tendons deep within the carpal tunnel for a total of 1 cc kenalog  40, 5 cc 1% lidocaine without epinephrine. Completed without difficulty  Advised to call if fevers/chills, erythema, induration, drainage, or persistent bleeding.  Images permanently stored and available for review in PACS.  Impression: Technically successful ultrasound guided median nerve hydrodissection.  Independent interpretation of notes and tests performed by another provider:   None.  Brief History, Exam, Impression, and Recommendations:    Bilateral carpal tunnel syndrome Bilateral median nerve hydrodissection, right side last done in June of this year, left side was last done in January. Repeat bilateral today.    ____________________________________________ [name]. Benjamin Stain, M.D., ABFM., CAQSM., AME. Primary Care and Sports Medicine [facility]  Adjunct Professor of Family Medicine  [employer]  Restaurant manager, fast food

## 2022-05-14 NOTE — Assessment & Plan Note (Signed)
Bilateral median nerve hydrodissection, right side last done in June of this year, left side was last done in January. Repeat bilateral today.

## 2022-07-04 ENCOUNTER — Telehealth: Payer: Self-pay | Admitting: General Practice

## 2022-07-04 NOTE — Telephone Encounter (Signed)
Transition Care Management Follow-up Telephone Call Date of discharge and from where: 07/02/22 from [facility] How have you been since you were released from the hospital? Doing much better Any questions or concerns? No  Items Reviewed: Did the pt receive and understand the discharge instructions provided? Yes  Medications obtained and verified? Yes  Other? No  Any new allergies since your discharge? No  Dietary orders reviewed? Yes Do you have support at home? Yes   Home Care and Equipment/Supplies: Were home health services ordered? no  Functional Questionnaire: (I = Independent and D = Dependent) ADLs: I  Bathing/Dressing- I  Meal Prep- I  Eating- I  Maintaining continence- I  Transferring/Ambulation- I  Managing Meds- I  Follow up appointments reviewed:  PCP Hospital f/u appt confirmed? No   Specialist Hospital f/u appt confirmed? No  Patient will call Dr. Lonia Skinner office. Are transportation arrangements needed? No  If their condition worsens, is the pt aware to call PCP or go to the Emergency Dept.? Yes Was the patient provided with contact information for the PCP's office or ED? Yes Was to pt encouraged to call back with questions or concerns? Yes

## 2022-07-16 ENCOUNTER — Ambulatory Visit: Payer: Self-pay | Admitting: Sports Medicine

## 2022-07-16 DIAGNOSIS — G5603 Carpal tunnel syndrome, bilateral upper limbs: Secondary | ICD-10-CM

## 2022-07-16 MED ORDER — PREGABALIN 75 MG PO CAPS: 75.0000 mg | ORAL_CAPSULE | Freq: Two times a day (BID) | ORAL | 3 refills | Status: DC

## 2022-07-16 NOTE — Assessment & Plan Note (Addendum)
Melvin Rich returns, he is a pleasant 61 year old male, he has bilateral carpal tunnel syndrome diagnosed clinically, he has had several bilateral median nerve hydrodissection's with ultrasound guidance the most recent of which was approximately 2 months ago. Right side continues to do well, left side is now having a recurrence of numbness and tingling. He does endorse fairly significant discomfort in the hand but also radiating proximal to the elbow. He did have a nerve conduction and EMG of both upper extremities in 2021 confirming bilateral carpal tunnel syndrome, the results are as follows:  "This is an abnormal study.  There is electrodiagnostic evidence of bilateral median neuropathy across the wrist, consistent with moderate bilateral carpal tunnel syndromes, demyelinating in nature.  There is no evidence of axonal loss. "  I am going to go ahead and get a consultation with Dr. Amanda Pea.

## 2022-07-16 NOTE — Progress Notes (Signed)
    Procedures performed today:    None.  Independent interpretation of notes and tests performed by another provider:   None.  Brief History, Exam, Impression, and Recommendations:    Bilateral carpal tunnel syndrome Melvin Rich returns, he is a pleasant 61 year old male, he has bilateral carpal tunnel syndrome diagnosed clinically, he has had several bilateral median nerve hydrodissection's with ultrasound guidance the most recent of which was approximately 2 months ago. Right side continues to do well, left side is now having a recurrence of numbness and tingling. He does endorse fairly significant discomfort in the hand but also radiating proximal to the elbow. He did have a nerve conduction and EMG of both upper extremities in 2021 confirming bilateral carpal tunnel syndrome, the results are as follows:  "This is an abnormal study.  There is electrodiagnostic evidence of bilateral median neuropathy across the wrist, consistent with moderate bilateral carpal tunnel syndromes, demyelinating in nature.  There is no evidence of axonal loss. "  I am going to go ahead and get a consultation with Dr. Amanda Pea.  Chronic process with exacerbation and pharmacologic intervention  ____________________________________________ Melvin Rich. Melvin Rich, M.D., ABFM., CAQSM., AME. Primary Care and Sports Medicine [facility]  Adjunct Professor of Family Medicine  [employer] [facility] [employer]  Restaurant manager, fast food

## 2022-07-17 ENCOUNTER — Emergency Department (HOSPITAL_BASED_OUTPATIENT_CLINIC_OR_DEPARTMENT_OTHER): Payer: Self-pay

## 2022-07-17 ENCOUNTER — Encounter (HOSPITAL_BASED_OUTPATIENT_CLINIC_OR_DEPARTMENT_OTHER): Payer: Self-pay

## 2022-07-17 ENCOUNTER — Emergency Department (HOSPITAL_BASED_OUTPATIENT_CLINIC_OR_DEPARTMENT_OTHER)
Admission: EM | Admit: 2022-07-17 | Discharge: 2022-07-17 | Discharge status: Against Medical Advice | Payer: Self-pay | Attending: Emergency Medicine | Admitting: Emergency Medicine

## 2022-07-17 DIAGNOSIS — T8140XA Infection following a procedure, unspecified, initial encounter: Secondary | ICD-10-CM | POA: Insufficient documentation

## 2022-07-17 DIAGNOSIS — M79605 Pain in left leg: Secondary | ICD-10-CM | POA: Insufficient documentation

## 2022-07-17 DIAGNOSIS — Z5321 Procedure and treatment not carried out due to patient leaving prior to being seen by health care provider: Secondary | ICD-10-CM | POA: Insufficient documentation

## 2022-07-17 DIAGNOSIS — R2242 Localized swelling, mass and lump, left lower limb: Secondary | ICD-10-CM | POA: Insufficient documentation

## 2022-07-17 LAB — BASIC METABOLIC PANEL
Anion gap: 7 (ref 5–15)
BUN: 24 mg/dL — ABNORMAL HIGH (ref 8–23)
CO2: 23 mmol/L (ref 22–32)
Calcium: 8.7 mg/dL — ABNORMAL LOW (ref 8.9–10.3)
Chloride: 105 mmol/L (ref 98–111)
Creatinine, Ser: 0.63 mg/dL (ref 0.61–1.24)
GFR, Estimated: >60 mL/min (ref >60)
Glucose, Bld: 93 mg/dL (ref 70–99)
Potassium: 3.6 mmol/L (ref 3.5–5.1)
Sodium: 135 mmol/L (ref 135–145)

## 2022-07-17 LAB — CBC WITH DIFFERENTIAL/PLATELET
Abs Immature Granulocytes: 0.02 10*3/uL (ref 0.00–0.07)
Basophils Absolute: 0 10*3/uL (ref 0.0–0.1)
Basophils Relative: 0 %
Eosinophils Absolute: 0.1 10*3/uL (ref 0.0–0.5)
Eosinophils Relative: 2 %
HCT: 35.7 % — ABNORMAL LOW (ref 39.0–52.0)
Hemoglobin: 11.6 g/dL — ABNORMAL LOW (ref 13.0–17.0)
Immature Granulocytes: 0 %
Lymphocytes Relative: 28 %
Lymphs Abs: 1.6 10*3/uL (ref 0.7–4.0)
MCH: 28.6 pg (ref 26.0–34.0)
MCHC: 32.5 g/dL (ref 30.0–36.0)
MCV: 87.9 fL (ref 80.0–100.0)
Monocytes Absolute: 0.5 10*3/uL (ref 0.1–1.0)
Monocytes Relative: 9 %
Neutro Abs: 3.4 10*3/uL (ref 1.7–7.7)
Neutrophils Relative %: 61 %
Platelets: 245 10*3/uL (ref 150–400)
RBC: 4.06 MIL/uL — ABNORMAL LOW (ref 4.22–5.81)
RDW: 12.8 % (ref 11.5–15.5)
WBC: 5.8 10*3/uL (ref 4.0–10.5)
nRBC: 0 % (ref 0.0–0.2)

## 2022-07-17 NOTE — ED Triage Notes (Signed)
Had surgery of left leg with hardware insertion. Hx MRSA . Had infection a few months ago, put on abx. A few weeks ago started having pain of surgical site, now having swelling and drainage. Surgeon wants eval for infection of hardware.

## 2022-07-18 ENCOUNTER — Other Ambulatory Visit: Payer: Self-pay

## 2022-07-18 ENCOUNTER — Encounter (HOSPITAL_BASED_OUTPATIENT_CLINIC_OR_DEPARTMENT_OTHER): Payer: Self-pay | Admitting: Emergency Medicine

## 2022-07-18 ENCOUNTER — Emergency Department (HOSPITAL_BASED_OUTPATIENT_CLINIC_OR_DEPARTMENT_OTHER)
Admission: EM | Admit: 2022-07-18 | Discharge: 2022-07-18 | Disposition: A | Discharge status: Home / Self Care | Payer: Self-pay | Attending: Emergency Medicine | Admitting: Emergency Medicine

## 2022-07-18 ENCOUNTER — Emergency Department (HOSPITAL_BASED_OUTPATIENT_CLINIC_OR_DEPARTMENT_OTHER): Payer: Self-pay

## 2022-07-18 DIAGNOSIS — Z7901 Long term (current) use of anticoagulants: Secondary | ICD-10-CM | POA: Insufficient documentation

## 2022-07-18 DIAGNOSIS — R000 Tachycardia, unspecified: Secondary | ICD-10-CM | POA: Insufficient documentation

## 2022-07-18 DIAGNOSIS — L03116 Cellulitis of left lower limb: Secondary | ICD-10-CM

## 2022-07-18 MED ORDER — IOHEXOL 300 MG/ML  SOLN
80.0000 mL | Freq: Once | INTRAMUSCULAR | Status: AC | PRN
  Administered 2022-07-18: 80 mL via INTRAVENOUS

## 2022-07-18 MED ORDER — CLINDAMYCIN HCL 300 MG PO CAPS: 300.0000 mg | ORAL_CAPSULE | Freq: Three times a day (TID) | ORAL | 0 refills | Status: AC
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
## 2022-07-18 NOTE — Discharge Instructions (Addendum)
Today you were seen in the emergency department for your leg wound.    In the emergency department you had a CT scan that did not show any deeper infection.    At home, please take the antibiotics we have prescribed you to prevent worsening infection.    Check your MyChart online for the results of any tests that had not resulted by the time you left the emergency department.   Follow-up with your primary doctor in 2-3 days regarding your visit.  Follow-up call your orthopedic surgeon today to schedule follow-up appointment as soon as possible.  Return immediately to the emergency department if you experience any of the following: worsening redness, fevers, or any other concerning symptoms.    Thank you for visiting our Emergency Department. It was a pleasure taking care of you today.
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
## 2022-07-23 ENCOUNTER — Ambulatory Visit: Payer: Self-pay | Attending: Cardiology | Admitting: Cardiology

## 2022-07-28 ENCOUNTER — Telehealth: Payer: Self-pay

## 2022-07-28 NOTE — Telephone Encounter (Signed)
Patients spouse came into office to get form filled out for patient, patients wife states that he hasn't taken his medications for his heart in a few weeks and has been in and out of the hospital, pt's spouse states that he would need a refill on his medications and that she is switching pharmacy to [facility], patient is aware of possible $29 fee for paperwork, please advise, thanks!!

## 2022-07-28 NOTE — Telephone Encounter (Signed)
Patients wife called to let you know that she dropped off forms that needs to be filled out ASAP so he can get his medications and she also wanted you to know that he has been out of his heart medication for a couple of weeks and he is getting weak and throwing up.   She needs you to send in his heart medication and his water pills today.

## 2022-07-29 ENCOUNTER — Other Ambulatory Visit: Payer: Self-pay | Admitting: Family Medicine

## 2022-07-29 ENCOUNTER — Other Ambulatory Visit: Payer: Self-pay

## 2022-07-29 ENCOUNTER — Other Ambulatory Visit (HOSPITAL_BASED_OUTPATIENT_CLINIC_OR_DEPARTMENT_OTHER): Payer: Self-pay

## 2022-07-29 DIAGNOSIS — I428 Other cardiomyopathies: Secondary | ICD-10-CM

## 2022-07-29 MED ORDER — FUROSEMIDE 40 MG PO TABS
40.0000 mg | ORAL_TABLET | Freq: Every day | ORAL | 0 refills | Status: DC
  Filled 2022-07-29: qty 30, 30d supply, fill #0

## 2022-07-29 MED ORDER — FUROSEMIDE 40 MG PO TABS
40.0000 mg | ORAL_TABLET | Freq: Every day | ORAL | 0 refills | Status: DC
Start: 2022-07-29 — End: 2022-08-05

## 2022-07-29 MED ORDER — JARDIANCE 10 MG PO TABS
10.0000 mg | ORAL_TABLET | Freq: Every day | ORAL | 0 refills | Status: DC
  Filled 2022-07-29: qty 30, 30d supply, fill #0

## 2022-07-29 MED ORDER — JARDIANCE 10 MG PO TABS: 10.0000 mg | ORAL_TABLET | Freq: Every day | ORAL | 0 refills | Status: DC

## 2022-07-29 MED ORDER — SACUBITRIL-VALSARTAN 49-51 MG PO TABS
1.0000 | ORAL_TABLET | Freq: Two times a day (BID) | ORAL | 0 refills | Status: DC
  Filled 2022-07-29: qty 30, 15d supply, fill #0

## 2022-07-29 MED ORDER — SACUBITRIL-VALSARTAN 49-51 MG PO TABS: 1.0000 | ORAL_TABLET | Freq: Two times a day (BID) | ORAL | 0 refills | Status: DC

## 2022-07-29 NOTE — Telephone Encounter (Signed)
I spoke with Sue Lush and advised here that the form that she dropped off was filled out incorrectly and that we need a new form. I also advised her that Loren is past due for a follow up appointment and have him scheduled for December 5th @ 1 PM. I also clarified which medication that he is needing sent to [facility]. I sent 30 day supply with no refills for Entresto, Jardiance, and Lasix.

## 2022-07-29 NOTE — Telephone Encounter (Signed)
Melvin Rich came to the office to pick up the original form.   Melvin Rich gave the form to [facility] and helped to complete the rest of her requests.  Thank you Melvin Rich

## 2022-07-29 NOTE — Telephone Encounter (Signed)
Patient is scheduled for 12/5, prescriptions printed and signed by Dr.Matthews, thanks!

## 2022-07-30 ENCOUNTER — Other Ambulatory Visit (HOSPITAL_BASED_OUTPATIENT_CLINIC_OR_DEPARTMENT_OTHER): Payer: Self-pay

## 2022-08-05 ENCOUNTER — Ambulatory Visit: Payer: Medicaid Other | Admitting: Medical-Surgical

## 2022-08-05 ENCOUNTER — Encounter: Payer: Self-pay | Admitting: Medical-Surgical

## 2022-08-05 VITALS — BP 106/56 | HR 53 | Resp 20 | Ht 69.0 in | Wt 156.7 lb

## 2022-08-05 DIAGNOSIS — I428 Other cardiomyopathies: Secondary | ICD-10-CM

## 2022-08-05 DIAGNOSIS — I10 Essential (primary) hypertension: Secondary | ICD-10-CM | POA: Diagnosis not present

## 2022-08-05 DIAGNOSIS — F418 Other specified anxiety disorders: Secondary | ICD-10-CM | POA: Diagnosis not present

## 2022-08-05 MED ORDER — FUROSEMIDE 40 MG PO TABS: 40.0000 mg | ORAL_TABLET | Freq: Every day | ORAL | 5 refills | Status: AC

## 2022-08-05 MED ORDER — METOPROLOL SUCCINATE ER 25 MG PO TB24: 12.5000 mg | ORAL_TABLET | Freq: Every day | ORAL | 1 refills | Status: AC

## 2022-08-05 MED ORDER — JARDIANCE 10 MG PO TABS: 10.0000 mg | ORAL_TABLET | Freq: Every day | ORAL | 5 refills | Status: AC

## 2022-08-05 MED ORDER — SACUBITRIL-VALSARTAN 49-51 MG PO TABS: 1.0000 | ORAL_TABLET | Freq: Two times a day (BID) | ORAL | 5 refills | Status: AC

## 2022-08-05 NOTE — Progress Notes (Signed)
Established Patient Office Visit  Subjective   Patient ID: Melvin Rich, male   DOB: 1961-03-16 Age: 61 y.o. MRN: [id number]   Chief Complaint  Patient presents with   Form Completion   HPI Pleasant 61 year old male presenting today for for medication refills.  His wife had originally presented with a form that needed to be completed for patient assistance a couple of weeks ago.  Unfortunately, she had filled this out incorrectly and we had to request a another form.  Today, the patient reports that he does not have the form and his wife is not with him.  He is getting his medications through patient assistance and notes that he does not need the form anymore.  He is requesting to have parenteral prescriptions sent in for metoprolol succinate 12.5 mg nightly and Entresto 49-51 mg twice daily.  Also would like to get refills on any other medications that he should be taking.  Has not followed up with cardiology since he was told he needed to be completely infection free to be able to get a pacemaker implanted.  Has been having a lot of issues with depression and anxiety recently.  Having difficulty in his relationship as well as multiple health problems.  He is not currently taking any medications but is not closed off to the idea.  Reports that he feels like he might need to go talk to someone see what would be the best treatment.   Objective:    Vitals:   08/05/22 1314  BP: (!) 106/56  Pulse: (!) 53  Resp: 20  Height: 5\' 9"  (1.753 m)  Weight: 156 lb 11.2 oz (71.1 kg)  SpO2: 98%  BMI (Calculated): 23.13    Physical Exam Vitals reviewed.  Constitutional:      General: He is not in acute distress.    Appearance: Normal appearance. He is normal weight. He is not ill-appearing.  HENT:     Head: Normocephalic and atraumatic.  Cardiovascular:     Rate and Rhythm: Normal rate and regular rhythm.     Pulses: Normal pulses.     Heart sounds: Normal heart sounds. No murmur  heard.    No friction rub. No gallop.  Pulmonary:     Effort: Pulmonary effort is normal. No respiratory distress.     Breath sounds: Normal breath sounds.  Skin:    General: Skin is warm and dry.  Neurological:     Mental Status: He is alert and oriented to person, place, and time.  Psychiatric:        Mood and Affect: Mood normal.        Behavior: Behavior normal.        Thought Content: Thought content normal.        Judgment: Judgment normal.   No results found for this or any previous visit (from the past 24 hour(s)).     The 10-year ASCVD risk score (Arnett DK, et al., 2019) is: 10.3%   Values used to calculate the score:     Age: 63 years     Sex: Male     Is Non-Hispanic African American: No     Diabetic: No     Tobacco smoker: Yes     Systolic Blood Pressure: 106 mmHg     Is BP treated: Yes     HDL Cholesterol: 45 mg/dL     Total Cholesterol: 151 mg/dL   Assessment & Plan:   1. Nonischemic cardiomyopathy (HCC) 2. Hypertension goal  BP (blood pressure) < 130/80 Blood pressure well controlled today.  He has had some hypotensive issues intermittently and tends to increase his sodium intake.  Taking Lasix as needed.  Refilling furosemide, Entresto, and metoprolol XL.  Prescriptions printed per patient request. - sacubitril-valsartan (ENTRESTO) 49-51 MG; Take 1 tablet by mouth 2 (two) times daily.  Dispense: 60 tablet; Refill: 5  2. Depression with anxiety Referring to psychiatry per patient request. - Ambulatory referral to Psychiatry  Return in about 6 months (around 02/04/2023) for chronic disease follow up.  ___________________________________________ Thayer Ohm, DNP, APRN, FNP-BC Primary Care and Sports Medicine [facility]

## 2022-08-11 ENCOUNTER — Telehealth: Payer: Self-pay

## 2022-08-11 ENCOUNTER — Emergency Department (HOSPITAL_BASED_OUTPATIENT_CLINIC_OR_DEPARTMENT_OTHER): Payer: Medicaid Other

## 2022-08-11 ENCOUNTER — Other Ambulatory Visit: Payer: Self-pay

## 2022-08-11 ENCOUNTER — Emergency Department (HOSPITAL_BASED_OUTPATIENT_CLINIC_OR_DEPARTMENT_OTHER)
Admission: EM | Admit: 2022-08-11 | Discharge: 2022-08-11 | Disposition: A | Discharge status: Home / Self Care | Payer: Medicaid Other | Attending: Emergency Medicine | Admitting: Emergency Medicine

## 2022-08-11 ENCOUNTER — Encounter (HOSPITAL_BASED_OUTPATIENT_CLINIC_OR_DEPARTMENT_OTHER): Payer: Self-pay | Admitting: Emergency Medicine

## 2022-08-11 DIAGNOSIS — I110 Hypertensive heart disease with heart failure: Secondary | ICD-10-CM | POA: Insufficient documentation

## 2022-08-11 DIAGNOSIS — M79605 Pain in left leg: Secondary | ICD-10-CM | POA: Diagnosis present

## 2022-08-11 DIAGNOSIS — L539 Erythematous condition, unspecified: Secondary | ICD-10-CM | POA: Insufficient documentation

## 2022-08-11 DIAGNOSIS — I509 Heart failure, unspecified: Secondary | ICD-10-CM | POA: Insufficient documentation

## 2022-08-11 DIAGNOSIS — R000 Tachycardia, unspecified: Secondary | ICD-10-CM | POA: Diagnosis not present

## 2022-08-11 MED ORDER — SENNOSIDES-DOCUSATE SODIUM 8.6-50 MG PO TABS: 1.0000 | ORAL_TABLET | Freq: Every evening | ORAL | 0 refills | Status: AC | PRN

## 2022-08-11 MED ORDER — OXYCODONE-ACETAMINOPHEN 5-325 MG PO TABS
1.0000 | ORAL_TABLET | Freq: Once | ORAL | Status: AC
  Administered 2022-08-11: 1 via ORAL
  Filled 2022-08-11: qty 1

## 2022-08-11 MED ORDER — DOXYCYCLINE HYCLATE 100 MG PO CAPS: 100.0000 mg | ORAL_CAPSULE | Freq: Two times a day (BID) | ORAL | 0 refills | Status: AC

## 2022-08-11 MED ORDER — OXYCODONE-ACETAMINOPHEN 5-325 MG PO TABS: 1.0000 | ORAL_TABLET | Freq: Four times a day (QID) | ORAL | 0 refills | Status: AC | PRN

## 2022-08-11 NOTE — Telephone Encounter (Signed)
Patient called stating that he missed our call because he is in the ER and his phone is on silent. He is there for his left leg pain and he is ready to have it amputated.  I don't see a message from our office. Maybe is was from Psychiatry.

## 2022-08-11 NOTE — ED Notes (Signed)
Pt provided urinal.

## 2022-08-11 NOTE — ED Notes (Signed)
Pt discharged to home. Discharge instructions have been discussed with patient and/or family members. Pt verbally acknowledges understanding d/c instructions, and endorses comprehension to checkout at registration before leaving.  °

## 2022-08-11 NOTE — ED Provider Notes (Signed)
Emergency Department Provider Note   I have reviewed the triage vital signs and the nursing notes.   HISTORY  Chief Complaint Leg Pain and Knee Pain   HPI Melvin Rich is a 61 y.o. male with past history of tibial plateau fracture requiring fixation and skin graft in the [facility] system returns to the ED with recurrent left leg pain.  Symptoms began yesterday without clear provocation.  He has been walking more on the leg and using it to go up stairs which is new but denies fall or obvious injury.  No fevers or chills.  No drainage from the leg.  Pain is mainly across the left lateral knee and occurs intermittently and spasm like pain.    Past Medical History:  Diagnosis Date   Alcohol use disorder    BPPV (benign paroxysmal positional vertigo), right 07/27/2018   Cardiomegaly 11/16/2019   CHF (congestive heart failure) (HCC)    Chronic active hepatitis (HCC) 04/23/2017   Pt stated that he had the treatment   Chronic hepatitis C virus infection (HCC) 04/23/2017   COVID-19    DDD (degenerative disc disease), cervical 04/18/2017   Post C5-C7 ACDF with Dr. 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 Respiratory:  Denies shortness of breath. Gastrointestinal: No abdominal pain.   Musculoskeletal: Negative for back pain. Positive left leg pain.  Skin: Negative for rash. Neurological: Negative for headaches.  ____________________________________________   PHYSICAL EXAM:  VITAL SIGNS: ED Triage Vitals  Enc Vitals Group     BP 08/11/22 0940 (!) 117/91     Pulse Rate 08/11/22 0940 (!) 118     Resp 08/11/22 0940 20     Temp 08/11/22 0940 98 F (36.7 C)     Temp Source 08/11/22 0940 Oral     SpO2 08/11/22 0940 98 %     Weight 08/11/22 0941 156 lb 12 oz (71.1 kg)     Height 08/11/22 0941 5\' 9"  (1.753 m)   Constitutional: Alert and oriented. Well appearing and in no acute distress. Eyes: Conjunctivae are normal.  Head: Atraumatic. Nose: No congestion/rhinnorhea. Mouth/Throat: Mucous membranes are moist.  Neck: No stridor.   Cardiovascular: Tachycardia. Good peripheral circulation. Grossly normal heart sounds.   Respiratory: Normal respiratory effort.  No retractions. Lungs CTAB. Gastrointestinal: No distention.  Musculoskeletal: Minimal erythema inferior to the left knee.  No joint effusion or limited range of motion.  No drainage or bleeding.  Neurologic:  Normal speech and language.  Skin:  Skin is warm, dry and intact. No rash noted. ____________________________________________   LABS (all labs ordered are listed, but only abnormal results are displayed)  Labs Reviewed - No data to display  ____________________________________________  EKG  *** ____________________________________________  RADIOLOGY  No results found.  ____________________________________________   PROCEDURES  Procedure(s) performed:   Procedures   ____________________________________________   INITIAL IMPRESSION / ASSESSMENT AND PLAN / ED COURSE  Pertinent labs & imaging results that were available during my care of the patient were reviewed by me and considered in my medical decision making (see  chart for details).   This patient is Presenting for Evaluation of leg pain, which does require a range of treatment options, and is a complaint that involves a high risk of morbidity and mortality.  The Differential Diagnoses include chronic pain, cellulitis, septic arthritis, DVT, etc.  Critical Interventions-    Medications  oxyCODONE-acetaminophen (PERCOCET/ROXICET) 5-325 MG per tablet 1 tablet (has no administration in time range)    Reassessment after intervention:     I decided to review pertinent External Data, and in summary patient with fracture in the left leg s/p repair with skin grafting in the [facility] system.   Clinical Laboratory Tests Ordered, included   Radiologic Tests Ordered, included ***. I independently interpreted the images and agree with radiology interpretation.   Cardiac Monitor Tracing which shows Tachycardia.    Social Determinants of Health Risk patient is a smoker.   Consult complete with  Medical Decision Making: Summary:  Patient presents to the emergency department with left leg pain.  No findings on my exam to strongly suspect septic arthritis.  Slight erythema inferior to the knee/tib-fib but no clear abscess or draining infection.  Reevaluation with update and discussion with   ***Considered admission***  Patient's presentation is most consistent with {EM COPA:27473}   Disposition:   ____________________________________________  FINAL CLINICAL IMPRESSION(S) / ED DIAGNOSES  Final diagnoses:  None     NEW OUTPATIENT MEDICATIONS STARTED DURING THIS VISIT:  New Prescriptions   No medications on file    Note:  This document was prepared using Dragon voice recognition software and may include unintentional dictation errors.  Alona Bene, MD, [facility]

## 2022-08-11 NOTE — Discharge Instructions (Signed)
You were seen in the emergency room today with left leg pain.  I am starting you on antibiotic in case you are developing some mild skin infection but for now I do not see evidence of deeper infection.  Please follow closely with your plastic surgery/orthopedics team at [facility].  Return with any new or suddenly worsening symptoms.

## 2022-08-11 NOTE — ED Triage Notes (Addendum)
Pt arrives POv with c/o of left knee pain states old injury AND HAS HAD INFECTION  from the last time he was here states he finished antibiotics left knee and lower leg is red and very tender to movement , pt jerks and curses with pain, pt states pain got bad yesterday

## 2022-08-14 ENCOUNTER — Ambulatory Visit (INDEPENDENT_AMBULATORY_CARE_PROVIDER_SITE_OTHER): Payer: Medicaid Other | Admitting: Sports Medicine

## 2022-08-14 DIAGNOSIS — G5603 Carpal tunnel syndrome, bilateral upper limbs: Secondary | ICD-10-CM | POA: Diagnosis not present

## 2022-08-14 DIAGNOSIS — S82202S Unspecified fracture of shaft of left tibia, sequela: Secondary | ICD-10-CM | POA: Diagnosis not present

## 2022-08-14 MED ORDER — PREGABALIN 150 MG PO CAPS: 150.0000 mg | ORAL_CAPSULE | Freq: Two times a day (BID) | ORAL | 3 refills | Status: AC

## 2022-08-14 NOTE — Progress Notes (Signed)
    Procedures performed today:    None.  Independent interpretation of notes and tests performed by another provider:   None.  Brief History, Exam, Impression, and Recommendations:    Bilateral carpal tunnel syndrome Deacon returns, he is a pleasant 61 year old male with bilateral carpal tunnel syndrome with confirmation on nerve conduction and EMG status post left carpal tunnel release in the office with Dr. Caleen Jobs. Similar symptoms on the right, we will double his Lyrica for symptom relief until he can get the procedure done. He does have triggering on the right hand as well so my hope is that Dr. Yehuda Budd can do an A1 pulley release at the same time.  Left open tibial fracture post Ex-Fix and then intramedullary nail. History of left open tibial fracture status post external fixator and intramedullary nailing back in the summertime with [facility] traumatology, unfortunately continues to have what appears to be a low-grade infection. Due to failure of conservative treatment with a surgeon I would like infectious disease at [facility] to weigh in.    ____________________________________________ [name]. Benjamin Stain, M.D., ABFM., CAQSM., AME. Primary Care and Sports Medicine [facility]  Adjunct Professor of Family Medicine  [employer]  Restaurant manager, fast food

## 2022-08-14 NOTE — Assessment & Plan Note (Signed)
History of left open tibial fracture status post external fixator and intramedullary nailing back in the summertime with [facility] traumatology, unfortunately continues to have what appears to be a low-grade infection. Due to failure of conservative treatment with a surgeon I would like infectious disease at [facility] to weigh in.

## 2022-08-14 NOTE — Assessment & Plan Note (Signed)
Melvin Rich returns, he is a pleasant 61 year old male with bilateral carpal tunnel syndrome with confirmation on nerve conduction and EMG status post left carpal tunnel release in the office with Dr. Caleen Jobs. Similar symptoms on the right, we will double his Lyrica for symptom relief until he can get the procedure done. He does have triggering on the right hand as well so my hope is that Dr. Yehuda Budd can do an A1 pulley release at the same time.

## 2022-08-21 ENCOUNTER — Encounter: Payer: Self-pay | Admitting: Sports Medicine

## 2022-10-02 DEATH — deceased

## 2023-02-05 ENCOUNTER — Ambulatory Visit: Payer: Self-pay | Admitting: Medical-Surgical
